# Patient Record
Sex: Female | Born: 1945 | Race: White | Hispanic: No | Marital: Married | Smoking: Former smoker
Health system: Southern US, Community
[De-identification: ages and names within clinical notes are randomized; demographics above are authoritative.]

## PROBLEM LIST (undated history)

## (undated) DIAGNOSIS — K760 Fatty (change of) liver, not elsewhere classified: Secondary | ICD-10-CM

## (undated) DIAGNOSIS — M069 Rheumatoid arthritis, unspecified: Secondary | ICD-10-CM

## (undated) DIAGNOSIS — E119 Type 2 diabetes mellitus without complications: Secondary | ICD-10-CM

## (undated) DIAGNOSIS — G373 Acute transverse myelitis in demyelinating disease of central nervous system: Secondary | ICD-10-CM

## (undated) DIAGNOSIS — I10 Essential (primary) hypertension: Secondary | ICD-10-CM

## (undated) DIAGNOSIS — G43909 Migraine, unspecified, not intractable, without status migrainosus: Secondary | ICD-10-CM

## (undated) DIAGNOSIS — G709 Myoneural disorder, unspecified: Secondary | ICD-10-CM

## (undated) DIAGNOSIS — I214 Non-ST elevation (NSTEMI) myocardial infarction: Secondary | ICD-10-CM

## (undated) DIAGNOSIS — M1990 Unspecified osteoarthritis, unspecified site: Secondary | ICD-10-CM

## (undated) HISTORY — DX: Unspecified osteoarthritis, unspecified site: M19.90

## (undated) HISTORY — PX: FRACTURE SURGERY: SHX138

## (undated) HISTORY — DX: Non-ST elevation (NSTEMI) myocardial infarction: I21.4

## (undated) HISTORY — PX: TUBAL LIGATION: SHX77

---

## 1968-06-15 HISTORY — PX: BREAST LUMPECTOMY: SHX2

## 1977-06-15 DIAGNOSIS — G373 Acute transverse myelitis in demyelinating disease of central nervous system: Secondary | ICD-10-CM

## 1977-06-15 HISTORY — DX: Acute transverse myelitis in demyelinating disease of central nervous system: G37.3

## 1980-06-15 HISTORY — PX: VAGINAL HYSTERECTOMY: SUR661

## 2008-09-13 HISTORY — PX: ORIF ANKLE FRACTURE: SUR919

## 2008-09-22 ENCOUNTER — Inpatient Hospital Stay (HOSPITAL_COMMUNITY): Admission: EM | Admit: 2008-09-22 | Discharge: 2008-09-25 | Payer: Self-pay | Admitting: Emergency Medicine

## 2009-11-28 ENCOUNTER — Ambulatory Visit (HOSPITAL_COMMUNITY): Admission: RE | Admit: 2009-11-28 | Discharge: 2009-11-28 | Payer: Self-pay | Admitting: Rheumatology

## 2010-01-29 DIAGNOSIS — M899 Disorder of bone, unspecified: Secondary | ICD-10-CM | POA: Insufficient documentation

## 2010-09-24 LAB — PROTIME-INR
INR: 1 (ref 0.00–1.49)
Prothrombin Time: 12.8 seconds (ref 11.6–15.2)

## 2010-09-24 LAB — CBC
HCT: 41.7 % (ref 36.0–46.0)
HCT: 41.8 % (ref 36.0–46.0)
Hemoglobin: 13.9 g/dL (ref 12.0–15.0)
Hemoglobin: 14.1 g/dL (ref 12.0–15.0)
MCHC: 33.8 g/dL (ref 30.0–36.0)
MCV: 93.9 fL (ref 78.0–100.0)
Platelets: 130 10*3/uL — ABNORMAL LOW (ref 150–400)
RBC: 4.43 MIL/uL (ref 3.87–5.11)
RBC: 4.45 MIL/uL (ref 3.87–5.11)
RDW: 14.1 % (ref 11.5–15.5)
RDW: 14.4 % (ref 11.5–15.5)
WBC: 8.8 10*3/uL (ref 4.0–10.5)

## 2010-09-24 LAB — BASIC METABOLIC PANEL
Calcium: 8.3 mg/dL — ABNORMAL LOW (ref 8.4–10.5)
Creatinine, Ser: 0.67 mg/dL (ref 0.4–1.2)
GFR calc non Af Amer: >60 mL/min (ref >60)
Sodium: 138 mEq/L (ref 135–145)

## 2010-09-24 LAB — POCT I-STAT, CHEM 8
BUN: 13 mg/dL (ref 6–23)
Calcium, Ion: 1.11 mmol/L — ABNORMAL LOW (ref 1.12–1.32)
Chloride: 107 mEq/L (ref 96–112)
Glucose, Bld: 125 mg/dL — ABNORMAL HIGH (ref 70–99)
Sodium: 140 mEq/L (ref 135–145)
TCO2: 24 mmol/L (ref 0–100)

## 2010-09-24 LAB — DIFFERENTIAL
Lymphocytes Relative: 18 % (ref 12–46)
Monocytes Absolute: 0.8 10*3/uL (ref 0.1–1.0)

## 2010-10-28 NOTE — Op Note (Signed)
[name], Hayley Hogan                ACCOUNT NO.:  [id number]   MEDICAL RECORD NO.:  [id number]          PATIENT TYPE:  INP   LOCATION:  1601                         FACILITY:  [facility]   PHYSICIAN:  Ollen Gross, M.D.    DATE OF BIRTH:  [birth year]   DATE OF PROCEDURE:  09/22/2008  DATE OF DISCHARGE:                               OPERATIVE REPORT   PREOPERATIVE DIAGNOSIS:  Right trimalleolar ankle fracture.   POSTOPERATIVE DIAGNOSIS:  Right trimalleolar ankle fracture.   PROCEDURE:  Open reduction internal fixation right ankle fracture.   SURGEON:  Ollen Gross, MD.   ASSISTANT:  Avel Peace PA-C.   ANESTHESIA:  General.   ESTIMATED BLOOD LOSS:  Minimal.   DRAIN:  None.   TOURNIQUET TIME:  33 minutes at 300 mmHg.   COMPLICATIONS:  None.   CONDITION:  Stable to recovery.   BRIEF CLINICAL NOTE:  [name] is a 65 year old female who had a fall  today sustaining a displaced right trimalleolar ankle fracture.  She  presents now for open reduction internal fixation.   PROCEDURE IN DETAIL:  After the successful administration of general  anesthetic a tourniquet is placed on her right thigh and right lower  extremity is prepped and draped in the usual sterile fashion.  Extremity  is wrapped in Esmarch, tourniquet inflated to 300 mmHg.  A lateral  incision is made over the fibula starting at the tip, coursing  proximally about 4 inches.  Skin is cut with a 10 blade through  subcutaneous tissue which is dissected down to the fracture.  Fracture  is identified and reduced, held with a fracture reduction clamp.  A 7-  hole one-third tubular plate is configured for the fibula.  We filled  one hole with a cancellous screw distally, one with a cortical screw  proximally and the reduction is maintained.  We put two more cortical  screw proximally and two more cancellous screws distally and had very  good purchase.  The fracture was anatomically reduced, we could  visualize the  fracture line; thus, I placed a lag screw from anterior to  posterior, 22-mm in length, and that effectively closed down the  fracture site.  X-rays were taken AP, lateral and mortise views, showing  anatomic reduction of the lateral malleolus.  A small incision was then  made over the medial malleolus starting at the tip and coursing  proximally about 2 inches.  The fracture was identified and anatomically  reduced.  Starting at the tip of the malleolus and coursing proximally,  2.5 drill bits are passed parallel to each other.  The posterior drill  bits then removed and a 50-mm cancellous screw placed with excellent  purchase.  We then placed the anterior screw.  This anatomically reduced  the medial malleolus with excellent compression at the fracture site.  Both wounds were then copiously irrigated with saline solution.  Both wounds are closed subcu with interrupted 2-0 Vicryl and skin with  interrupted 4-0 nylon.  The incisions were cleaned, then dried.  Tourniquet is released with a total time of 33 minutes.  A bulky sterile  dressing is applied and she is placed into the posterior splint,  awakened and transported to recovery in stable condition.      Ollen Gross, M.D.  Electronically Signed     FA/MEDQ  D:  09/22/2008  T:  09/23/2008  Job:  [id number]

## 2010-10-28 NOTE — Discharge Summary (Signed)
NAMEJAEDAN, SCHUMAN                ACCOUNT NO.:  [id number]   MEDICAL RECORD NO.:  [id number]          PATIENT TYPE:  INP   LOCATION:  1601                         FACILITY:  [facility]   PHYSICIAN:  Ollen Gross, M.D.    DATE OF BIRTH:  01-19-1946   DATE OF ADMISSION:  09/22/2008  DATE OF DISCHARGE:  09/25/2008                               DISCHARGE SUMMARY   ADMITTING DIAGNOSES:  1. Right ankle trimalleolar ankle fracture.  2. Rheumatoid arthritis.   DISCHARGE DIAGNOSES:  1. Right trimalleolar ankle fracture status post ORIF right ankle.  2. Rheumatoid arthritis.  3. Small avulsion fracture base of the fifth metatarsal left ankle.   PROCEDURE:  September 22, 2008, ORIF right ankle.  Surgeon Dr. Lequita Halt,  assistant Avel Peace PA-C.  Anesthesia general.  Tourniquet time 33  minutes.  Consults none.   BRIEF HISTORY:  Hayley Hogan is a 65 year old female who had a fall on the  day of admission sustaining a right trimalleolar ankle fracture,  admitted for surgical intervention.   LABORATORY DATA:  CBC on admission hemoglobin of 14.1, hematocrit 41.8,  white cell count 8.8.  PT/INR 12.8 and 1.0, PTT of 27.  B-met slightly  elevated glucose of 125.  Remaining B-met within normal limits.  X-rays  on admission showed a right trimalleolar ankle fracture and also an  avulsion fracture off the base of the fifth metatarsal left ankle.   HOSPITAL COURSE:  Patient was admitted to [facility], taken to  OR, underwent above said procedure without complication.  The patient  tolerated procedure well, later transferred to recovery room on the  orthopedic floor.  Started on p.o. and IV analgesic pain control  following surgery.  Given 24 hours postop IV antibiotics, elevated  splint on pillows with ice.  Doing fairly well on the morning of day #1,  had good pain control.  Started getting up out of bed.  She is non-  weightbearing, touchdown weightbearing to the right lower extremity with  a  trimalleolar ankle fracture.  She was weightbearing as tolerated to  the left foot which had the small avulsion fracture off the base of the  fifth.  Started getting up out of bed by day two she was doing a little  bit better.  Pain was under better control.  We discontinued the fluids  and Hep-Lock the IV.  Started getting up around a little bit more, was  not ready to go home yet.  By the following day of September 25, 2008, she  was tolerating her therapy, tolerating her medicines and was ready go  home.   DISCHARGE/PLAN:  1. Discharged home on September 25, 2008.  2. Discharge diagnoses, please see above.  3. Discharge medications Lovenox, Vicodin, Flexeril.  4. Diet.  Resume home diet.  5. Activity.  She is nonweightbearing to the right lower extremity,      weightbearing as tolerated to the left lower extremity, up with      walker.  Keep splint and leg elevated in ice when not up      ambulating.  6.  Follow-up next week on April 20 with Dr. Lequita Halt.   DISPOSITION:  Home.   CONDITION ON DISCHARGE:  Improved.      Alexzandrew L. Perkins, P.A.C.      Ollen Gross, M.D.  Electronically Signed    ALP/MEDQ  D:  09/25/2008  T:  09/25/2008  Job:  [id number]

## 2011-10-01 ENCOUNTER — Encounter (HOSPITAL_COMMUNITY): Payer: Self-pay | Admitting: Internal Medicine

## 2011-10-01 ENCOUNTER — Observation Stay (HOSPITAL_COMMUNITY)
Admission: AD | Admit: 2011-10-01 | Discharge: 2011-10-04 | DRG: 025 | Disposition: A | Discharge status: Home / Self Care | Payer: BC Managed Care – PPO | Source: Ambulatory Visit | Attending: Internal Medicine | Admitting: Internal Medicine

## 2011-10-01 ENCOUNTER — Inpatient Hospital Stay (HOSPITAL_COMMUNITY): Payer: BC Managed Care – PPO

## 2011-10-01 DIAGNOSIS — T50995A Adverse effect of other drugs, medicaments and biological substances, initial encounter: Secondary | ICD-10-CM | POA: Diagnosis present

## 2011-10-01 DIAGNOSIS — M129 Arthropathy, unspecified: Secondary | ICD-10-CM | POA: Diagnosis present

## 2011-10-01 DIAGNOSIS — R112 Nausea with vomiting, unspecified: Secondary | ICD-10-CM | POA: Diagnosis present

## 2011-10-01 DIAGNOSIS — R519 Headache, unspecified: Secondary | ICD-10-CM | POA: Diagnosis present

## 2011-10-01 DIAGNOSIS — R51 Headache: Principal | ICD-10-CM | POA: Diagnosis present

## 2011-10-01 DIAGNOSIS — M069 Rheumatoid arthritis, unspecified: Secondary | ICD-10-CM | POA: Diagnosis present

## 2011-10-01 DIAGNOSIS — R930 Abnormal findings on diagnostic imaging of skull and head, not elsewhere classified: Secondary | ICD-10-CM | POA: Diagnosis present

## 2011-10-01 DIAGNOSIS — I10 Essential (primary) hypertension: Secondary | ICD-10-CM | POA: Diagnosis present

## 2011-10-01 HISTORY — DX: Migraine, unspecified, not intractable, without status migrainosus: G43.909

## 2011-10-01 HISTORY — DX: Acute transverse myelitis in demyelinating disease of central nervous system: G37.3

## 2011-10-01 HISTORY — DX: Rheumatoid arthritis, unspecified: M06.9

## 2011-10-01 HISTORY — DX: Myoneural disorder, unspecified: G70.9

## 2011-10-01 HISTORY — DX: Essential (primary) hypertension: I10

## 2011-10-01 LAB — CBC
Hemoglobin: 16.3 g/dL — ABNORMAL HIGH (ref 12.0–15.0)
MCH: 30.1 pg (ref 26.0–34.0)
MCV: 85.8 fL (ref 78.0–100.0)
Platelets: 123 10*3/uL — ABNORMAL LOW (ref 150–400)
RBC: 5.42 MIL/uL — ABNORMAL HIGH (ref 3.87–5.11)
WBC: 7.6 10*3/uL (ref 4.0–10.5)

## 2011-10-01 LAB — COMPREHENSIVE METABOLIC PANEL
AST: 16 U/L (ref 0–37)
Albumin: 4.1 g/dL (ref 3.5–5.2)
BUN: 12 mg/dL (ref 6–23)
Calcium: 9.1 mg/dL (ref 8.4–10.5)
Creatinine, Ser: 0.64 mg/dL (ref 0.50–1.10)
GFR calc non Af Amer: >90 mL/min (ref >90)

## 2011-10-01 MED ORDER — POTASSIUM CHLORIDE IN NACL 20-0.9 MEQ/L-% IV SOLN
INTRAVENOUS | Status: DC
  Administered 2011-10-01 – 2011-10-03 (×4): via INTRAVENOUS
  Filled 2011-10-01 (×7): qty 1000

## 2011-10-01 MED ORDER — ACETAMINOPHEN 325 MG PO TABS: 650.0000 mg | ORAL_TABLET | Freq: Four times a day (QID) | ORAL | Status: DC | PRN

## 2011-10-01 MED ORDER — ZOLPIDEM TARTRATE 10 MG PO TABS: 10.0000 mg | ORAL_TABLET | Freq: Every evening | ORAL | Status: DC | PRN

## 2011-10-01 MED ORDER — ACETAMINOPHEN 650 MG RE SUPP: 650.0000 mg | Freq: Four times a day (QID) | RECTAL | Status: DC | PRN

## 2011-10-01 MED ORDER — CALCIUM CARBONATE 1250 (500 CA) MG PO TABS
1.0000 | ORAL_TABLET | Freq: Two times a day (BID) | ORAL | Status: DC
  Filled 2011-10-01 (×6): qty 1

## 2011-10-01 MED ORDER — BISACODYL 10 MG RE SUPP: 10.0000 mg | Freq: Every day | RECTAL | Status: DC | PRN

## 2011-10-01 MED ORDER — ONDANSETRON HCL 4 MG/2ML IJ SOLN
4.0000 mg | Freq: Four times a day (QID) | INTRAMUSCULAR | Status: DC | PRN
  Administered 2011-10-01 – 2011-10-03 (×6): 4 mg via INTRAVENOUS
  Filled 2011-10-01 (×6): qty 2

## 2011-10-01 MED ORDER — HYDROXYCHLOROQUINE SULFATE 200 MG PO TABS
200.0000 mg | ORAL_TABLET | Freq: Two times a day (BID) | ORAL | Status: DC
  Filled 2011-10-01 (×6): qty 1

## 2011-10-01 MED ORDER — ONDANSETRON HCL 4 MG PO TABS
4.0000 mg | ORAL_TABLET | Freq: Four times a day (QID) | ORAL | Status: DC | PRN
  Administered 2011-10-03 – 2011-10-04 (×3): 4 mg via ORAL
  Filled 2011-10-01 (×3): qty 1

## 2011-10-01 MED ORDER — HYDROCODONE-ACETAMINOPHEN 5-325 MG PO TABS
1.0000 | ORAL_TABLET | ORAL | Status: DC | PRN
  Administered 2011-10-03 (×3): 2 via ORAL
  Administered 2011-10-04 (×2): 1 via ORAL
  Administered 2011-10-04: 2 via ORAL
  Filled 2011-10-01: qty 1
  Filled 2011-10-01 (×3): qty 2
  Filled 2011-10-01: qty 1
  Filled 2011-10-01: qty 2

## 2011-10-01 MED ORDER — CALCIUM 75 MG PO TABS: 500.0000 mg | ORAL_TABLET | Freq: Two times a day (BID) | ORAL | Status: DC

## 2011-10-01 MED ORDER — FOLIC ACID 1 MG PO TABS
1.0000 mg | ORAL_TABLET | Freq: Every day | ORAL | Status: DC
  Administered 2011-10-04: 1 mg via ORAL
  Filled 2011-10-01 (×4): qty 1

## 2011-10-01 MED ORDER — HYDROMORPHONE HCL PF 1 MG/ML IJ SOLN
1.0000 mg | INTRAMUSCULAR | Status: DC | PRN
  Administered 2011-10-01 – 2011-10-02 (×5): 1 mg via INTRAVENOUS
  Filled 2011-10-01 (×5): qty 1

## 2011-10-01 MED ORDER — ADULT MULTIVITAMIN W/MINERALS CH
1.0000 | ORAL_TABLET | Freq: Every day | ORAL | Status: DC
  Filled 2011-10-01 (×3): qty 1

## 2011-10-01 MED ORDER — PREDNISOLONE 5 MG PO TABS
5.0000 mg | ORAL_TABLET | Freq: Two times a day (BID) | ORAL | Status: DC
  Filled 2011-10-01 (×3): qty 1

## 2011-10-01 MED ORDER — ALUM & MAG HYDROXIDE-SIMETH 200-200-20 MG/5ML PO SUSP
30.0000 mL | Freq: Four times a day (QID) | ORAL | Status: DC | PRN
Start: 2011-10-01 — End: 2011-10-04

## 2011-10-01 MED ORDER — METOPROLOL TARTRATE 50 MG PO TABS
50.0000 mg | ORAL_TABLET | Freq: Two times a day (BID) | ORAL | Status: DC
  Administered 2011-10-02 – 2011-10-04 (×5): 50 mg via ORAL
  Filled 2011-10-01 (×7): qty 1

## 2011-10-01 MED ORDER — METOPROLOL TARTRATE 25 MG PO TABS
25.0000 mg | ORAL_TABLET | Freq: Two times a day (BID) | ORAL | Status: DC
  Administered 2011-10-01: 25 mg via ORAL
  Filled 2011-10-01: qty 1

## 2011-10-01 NOTE — H&P (Signed)
Hayley Hogan is an 66 y.o. female.   Chief Complaint: severe headache with nausea and vomiting HPI:  The patient is a 66 year old woman who is generally in good health.  She awoke at 2 AM on April 15 with a pounding, severe headache which has continued since then.  The headache is the worst of her life, is constant, worse with lying down, and associated with nausea and vomiting of all food or fluid that she tries to consume.  She is not lightheaded and her vision is without change.  She generally does not have headaches.  She has tried nonsteroidal anti-inflammatory drugs without much relief of her headache.  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  Past Medical History  Diagnosis Date  . Hypertension   . Neuromuscular disorder   . Arthritis     Medications Prior to Admission  Medication Dose Route Frequency Provider Last Rate Last Dose  . 0.9 % NaCl with KCl 20 mEq/ L  infusion   Intravenous Continuous Garlan Fillers, MD 125 mL/hr at 10/01/11 1600    . acetaminophen (TYLENOL) tablet 650 mg  650 mg Oral Q6H PRN Garlan Fillers, MD       Or  . acetaminophen (TYLENOL) suppository 650 mg  650 mg Rectal Q6H PRN Garlan Fillers, MD      . alum & mag hydroxide-simeth (MAALOX/MYLANTA) 200-200-20 MG/5ML suspension 30 mL  30 mL Oral Q6H PRN Garlan Fillers, MD      . bisacodyl (DULCOLAX) suppository 10 mg  10 mg Rectal Daily PRN Garlan Fillers, MD      . calcium carbonate (OS-CAL - dosed in mg of elemental calcium) tablet 500 mg of elemental calcium  1 tablet Oral BID Garlan Fillers, MD      . folic acid (FOLVITE) tablet 1 mg  1 mg Oral Daily Garlan Fillers, MD      . HYDROcodone-acetaminophen [facility]) 5-325 MG per tablet 1-2 tablet  1-2 tablet Oral Q4H PRN Garlan Fillers, MD      . HYDROmorphone (DILAUDID) injection 1 mg  1 mg Intravenous Q4H PRN Garlan Fillers, MD   1 mg at 10/01/11 1532  . hydroxychloroquine (PLAQUENIL) tablet 200 mg  200 mg Oral BID Garlan Fillers, MD      . metoprolol tartrate (LOPRESSOR) tablet 25 mg  25 mg Oral BID Garlan Fillers, MD   25 mg at 10/01/11 1713  . mulitivitamin with minerals tablet 1 tablet  1 tablet Oral Daily Garlan Fillers, MD      . ondansetron [facility] tablet 4 mg  4 mg Oral Q6H PRN Garlan Fillers, MD       Or  . ondansetron [facility]) injection 4 mg  4 mg Intravenous Q6H PRN Garlan Fillers, MD   4 mg at 10/01/11 1529  . prednisoLONE tablet 5 mg  5 mg Oral BID Garlan Fillers, MD      . zolpidem [facility]) tablet 10 mg  10 mg Oral QHS PRN Garlan Fillers, MD      . DISCONTD: Calcium TABS 500 mg  500 mg Oral BID Garlan Fillers, MD       Medications Prior to Admission  Medication Sig Dispense Refill  . CALCIUM PO Take 1 tablet  by mouth daily.      Marland Kitchen leflunomide (ARAVA) 20 MG tablet Take 20 mg by mouth.        ADDITIONAL HOME MEDICATIONS: Leflunomide 20 mg tablets take 5 tabs by mouth on Saturdays and one tablet by mouth on other days of the week, prednisone 5 mg take 1-2 tabs by mouth daily, folate 800 mg by mouth daily, multivitamin 1 tab by mouth daily, Advil 200 mg take one or 2 tablets by mouth 3-4 times daily as needed for pain, calcium carbonate with vitamin D (600-200) take 1 tab by mouth daily, Platinol 200 mg take 2 tabs by mouth daily  PHYSICIANS INVOLVED IN CARE: Jarome Matin (primary care), Ollen Gross (orthopedics), Stacey Drain (rheumatology)  Past Surgical History  Procedure Date  . Abdominal hysterectomy   1973 bilateral tubal ligation, 1973 right breast lumpectomy (benign), 1981 total vaginal hysterectomy, April 2010 right ankle fracture with open reduction and internal fixation  History reviewed. No pertinent family history.  Her father died at age 72 years  for metastatic melanoma.  Her mother is age 35 years and alive and well.  A brother died age 39 years for murder.  A sister died at age 69 years from brain cancer.  Social History:  does not have a smoking history on file. She does not have any smokeless tobacco history on file. Her alcohol and drug histories not on file. She has a long-standing history of tobacco use of one half pack per day for many years that was stopped in August 2012, and she has no history of alcohol abuse.  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  She has one daughter and one son.  Allergies:  Allergies  Allergen Reactions  . Demerol     vomiting     ROS: ankle swelling, high blood pressure, tobacco use and headache, nausea, vomiting, thirsty  PHYSICAL EXAM: Blood pressure 164/96, pulse 73, temperature 98.2 F (36.8 C), temperature source Oral, resp. rate 17, SpO2 100.00%. In general, the patient is a well-nourished well-developed Caucasian woman who looks fatigued and flushed sitting on the side of an exam table.  HEENT exam was significant for bilateral scleral injection, neck was supple without jugular venous distention or carotid bruit, chest was clear to auscultation, heart had a regular rate and rhythm without significant gallop or murmur, abdomen had normal bowel sounds and no hepatosplenomegaly or tenderness or bruit, extremities were without cyanosis, clubbing, or edema.  Neurologic exam: She was alert and well oriented with normal affect, cranial nerves II through XII were normal, sensory exam was grossly normal, motor strength is 5 out of 5 throughout, deep tendon reflexes were normal, Romberg test is normal, tandem gait was normal and finger to nose testing was normal.  Results for orders placed during the hospital encounter of 10/01/11 (from the past 48 hour(s))  COMPREHENSIVE METABOLIC PANEL     Status: Abnormal   Collection Time   10/01/11  3:00  PM      Component Value Range Comment   Sodium 135  135 - 145 (mEq/L)    Potassium 3.6  3.5 - 5.1 (mEq/L)    Chloride 98  96 - 112 (mEq/L)    CO2 25  19 - 32 (mEq/L)    Glucose, Bld 154 (*) 70 - 99 (mg/dL)    BUN 12  6 - 23 (mg/dL)    Creatinine, Ser 0.98  0.50 - 1.10 (mg/dL)  Calcium 9.1  8.4 - 10.5 (mg/dL)    Total Protein 7.7  6.0 - 8.3 (g/dL)    Albumin 4.1  3.5 - 5.2 (g/dL)    AST 16  0 - 37 (U/L)    ALT 22  0 - 35 (U/L)    Alkaline Phosphatase 98  39 - 117 (U/L)    Total Bilirubin 0.5  0.3 - 1.2 (mg/dL)    GFR calc non Af Amer >90  >90 (mL/min)    GFR calc Af Amer >90  >90 (mL/min)   CBC     Status: Abnormal   Collection Time   10/01/11  3:00 PM      Component Value Range Comment   WBC 7.6  4.0 - 10.5 (K/uL)    RBC 5.42 (*) 3.87 - 5.11 (MIL/uL)    Hemoglobin 16.3 (*) 12.0 - 15.0 (g/dL)    HCT 11.9 (*) 14.7 - 46.0 (%)    MCV 85.8  78.0 - 100.0 (fL)    MCH 30.1  26.0 - 34.0 (pg)    MCHC 35.1  30.0 - 36.0 (g/dL)    RDW 82.9  56.2 - 13.0 (%)    Platelets 123 (*) 150 - 400 (K/uL)    No results found.   Assessment/Plan #1 Headache: This was described as the most severe of her life.  Her headache could be from hypertension or the headache could have caused elevated blood pressure.  With a normal neurologic exam a CNS bleed is not likely, however this should be excluded by testing.  We will check a CT scan of the head without IV contrast and a lumbar puncture to exclude the possibility of a CNS bleed and subarachnoid hemorrhage.  In addition, we will treat her with IM Solu-Medrol 120 mg given in our office with Vicodin as needed. #2 Nausea and Vomiting: Most likely from the pain of her headache.  She will be prescribed Zofran IV as needed.  And we will start her on food when she is ready for this. #3 Elevated Blood Pressure: her blood pressure was moderately elevated.  We will check the head CT scan to make sure there is not a CNS mass and evidence of increased intracranial  pressure.  We will then start her in treatment with a beta blocker.  Kalima Saylor G 10/01/2011, 5:50 PM

## 2011-10-02 ENCOUNTER — Inpatient Hospital Stay (HOSPITAL_COMMUNITY): Payer: BC Managed Care – PPO

## 2011-10-02 LAB — CSF CELL COUNT WITH DIFFERENTIAL
Eosinophils, CSF: 0 % (ref 0–1)
Eosinophils, CSF: 0 % (ref 0–1)
Other Cells, CSF: 0
Other Cells, CSF: 0
WBC, CSF: 2 /mm3 (ref 0–5)
WBC, CSF: 2 /mm3 (ref 0–5)

## 2011-10-02 LAB — GRAM STAIN

## 2011-10-02 MED ORDER — VERAPAMIL HCL 40 MG PO TABS
40.0000 mg | ORAL_TABLET | Freq: Three times a day (TID) | ORAL | Status: DC
  Administered 2011-10-02 – 2011-10-03 (×2): 40 mg via ORAL
  Filled 2011-10-02 (×5): qty 1

## 2011-10-02 MED ORDER — HYDROMORPHONE HCL PF 1 MG/ML IJ SOLN
1.0000 mg | INTRAMUSCULAR | Status: DC | PRN
  Administered 2011-10-02 (×3): 2 mg via INTRAVENOUS
  Administered 2011-10-03 (×2): 1 mg via INTRAVENOUS
  Filled 2011-10-02: qty 2
  Filled 2011-10-02 (×2): qty 1
  Filled 2011-10-02 (×2): qty 2

## 2011-10-02 MED ORDER — HYDROMORPHONE HCL PF 1 MG/ML IJ SOLN: 1.0000 mg | INTRAMUSCULAR | Status: DC | PRN

## 2011-10-02 MED ORDER — PREDNISONE 10 MG PO TABS
10.0000 mg | ORAL_TABLET | Freq: Two times a day (BID) | ORAL | Status: DC
  Administered 2011-10-02 – 2011-10-04 (×4): 10 mg via ORAL
  Filled 2011-10-02 (×6): qty 1

## 2011-10-02 MED ORDER — PREDNISOLONE 5 MG PO TABS
10.0000 mg | ORAL_TABLET | Freq: Two times a day (BID) | ORAL | Status: DC
  Filled 2011-10-02: qty 2

## 2011-10-02 NOTE — Progress Notes (Signed)
Subjective: She continues to have a moderately severe bifrontal headache with some nausea.  She has been able to eat a small amount of food, but has been using Zofran regularly.  She has not had change in vision or difficulty walking.  Objective: Vital signs in last 24 hours: Temp:  [98 F (36.7 C)-98.4 F (36.9 C)] 98 F (36.7 C) (04/19 0530) Pulse Rate:  [63-64] 63  (04/19 0530) Resp:  [18-22] 22  (04/19 0530) BP: (144-153)/(81-95) 153/95 mmHg (04/19 0530) SpO2:  [94 %-95 %] 95 % (04/19 0530) Weight:  [90 kg (198 lb 6.6 oz)] 90 kg (198 lb 6.6 oz) (04/19 0530) Weight change:    Intake/Output from previous day:     General appearance: alert, cooperative, mild distress and from moderate bifrontal headache with nausea Resp: clear to auscultation bilaterally Cardio: regular rate and rhythm, S1, S2 normal, no murmur, click, rub or gallop GI: soft, non-tender; bowel sounds normal; no masses,  no organomegaly Neurologic: Grossly normal  Lab Results:  Basename 10/01/11 1500  WBC 7.6  HGB 16.3*  HCT 46.5*  PLT 123*   BMET  Basename 10/01/11 1500  NA 135  K 3.6  CL 98  CO2 25  GLUCOSE 154*  BUN 12  CREATININE 0.64  CALCIUM 9.1   CMET CMP     Component Value Date/Time   NA 135 10/01/2011 1500   K 3.6 10/01/2011 1500   CL 98 10/01/2011 1500   CO2 25 10/01/2011 1500   GLUCOSE 154* 10/01/2011 1500   BUN 12 10/01/2011 1500   CREATININE 0.64 10/01/2011 1500   CALCIUM 9.1 10/01/2011 1500   PROT 7.7 10/01/2011 1500   ALBUMIN 4.1 10/01/2011 1500   AST 16 10/01/2011 1500   ALT 22 10/01/2011 1500   ALKPHOS 98 10/01/2011 1500   BILITOT 0.5 10/01/2011 1500   GFRNONAA >90 10/01/2011 1500   GFRAA >90 10/01/2011 1500    CBG (last 3)  No results found for this basename: GLUCAP:3 in the last 72 hours  INR RESULTS:   Lab Results  Component Value Date   INR 1.0 09/22/2008     Studies/Results: Ct Head Wo Contrast  10/01/2011  *RADIOLOGY REPORT*  Clinical Data: Worst headache of her  life.  CT HEAD WITHOUT CONTRAST  Technique:  Contiguous axial images were obtained from the base of the skull through the vertex without contrast.  Comparison: None available.  Findings: A focal area of hypoattenuation adjacent to the frontal horn of the right lateral ventricle measures 14 mm.  This likely represents an ischemic lesion.  No other acute cortical infarct, hemorrhage, mass lesion is present.  The ventricles are of normal size.  No significant extra-axial fluid collection is present.  The paranasal sinuses and mastoid air cells are clear.  The osseous skull is intact.  IMPRESSION:  1.  Asymmetric white matter disease adjacent to the frontal horn of the right lateral ventricle. 2.  No acute intracranial abnormality.  Original Report Authenticated By: Jamesetta Orleans. MATTERN, M.D.   Mr Maxine Glenn Head Wo Contrast  10/02/2011  *RADIOLOGY REPORT*  Clinical Data:  Severe headache.  Abnormal head CT.  Acute but ill- defined vascular disease.  MRI HEAD WITHOUT CONTRAST MRA HEAD WITHOUT CONTRAST  Technique:  Multiplanar, multiecho pulse sequences of the brain and surrounding structures were obtained without intravenous contrast. Angiographic images of the head were obtained using MRA technique without contrast.  Comparison:  Head CT 10/01/2011  MRI HEAD  Findings:  Diffusion imaging does not  show any acute or subacute infarction.  The brainstem and cerebellum are normal.  The cerebral hemispheres show scattered foci of abnormal T2 and FLAIR signal within the hemispheric deep white matter.  These are primarily periventricular.  The largest lesion is in the right frontal white matter adjacent to the frontal horn of the right lateral ventricle, measuring 1.5 cm in size.  These abnormalities could be old small vessel infarctions.  The possibility of chronic demyelinating disease does exist.  No cortical or large vessel territory infarction.  No mass lesion, hemorrhage, hydrocephalus or extra- axial collection.  No  pituitary mass.  No inflammatory sinus disease.  There is a small amount of fluid in the right mastoid air cells.  No skull or skull base lesion.  IMPRESSION: No acute brain pathology.  No specific cause of headache is identified.  Chronic appearing white matter lesions within the cerebral hemispheres, primarily affecting the periventricular white matter. Largest lesion measures 1.5 cm in the right frontal white matter. These abnormalities could relate to old white matter small vessel infarctions.  The possibility of demyelinating disease/multiple sclerosis does exist.  MRA HEAD  Findings: Both internal carotid arteries are widely patent into the brain.  The anterior and middle cerebral vessels are patent without proximal stenosis, aneurysm or vascular malformation.  Both vertebral arteries are widely patent to the basilar.  No basilar stenosis.  Posterior circulation branch vessels appear normal.  IMPRESSION: Normal intracranial MR angiography of the large and medium-sized vessels.  Original Report Authenticated By: Thomasenia Sales, M.D.   Mr Brain Wo Contrast  10/02/2011  *RADIOLOGY REPORT*  Clinical Data:  Severe headache.  Abnormal head CT.  Acute but ill- defined vascular disease.  MRI HEAD WITHOUT CONTRAST MRA HEAD WITHOUT CONTRAST  Technique:  Multiplanar, multiecho pulse sequences of the brain and surrounding structures were obtained without intravenous contrast. Angiographic images of the head were obtained using MRA technique without contrast.  Comparison:  Head CT 10/01/2011  MRI HEAD  Findings:  Diffusion imaging does not show any acute or subacute infarction.  The brainstem and cerebellum are normal.  The cerebral hemispheres show scattered foci of abnormal T2 and FLAIR signal within the hemispheric deep white matter.  These are primarily periventricular.  The largest lesion is in the right frontal white matter adjacent to the frontal horn of the right lateral ventricle, measuring 1.5 cm in size.   These abnormalities could be old small vessel infarctions.  The possibility of chronic demyelinating disease does exist.  No cortical or large vessel territory infarction.  No mass lesion, hemorrhage, hydrocephalus or extra- axial collection.  No pituitary mass.  No inflammatory sinus disease.  There is a small amount of fluid in the right mastoid air cells.  No skull or skull base lesion.  IMPRESSION: No acute brain pathology.  No specific cause of headache is identified.  Chronic appearing white matter lesions within the cerebral hemispheres, primarily affecting the periventricular white matter. Largest lesion measures 1.5 cm in the right frontal white matter. These abnormalities could relate to old white matter small vessel infarctions.  The possibility of demyelinating disease/multiple sclerosis does exist.  MRA HEAD  Findings: Both internal carotid arteries are widely patent into the brain.  The anterior and middle cerebral vessels are patent without proximal stenosis, aneurysm or vascular malformation.  Both vertebral arteries are widely patent to the basilar.  No basilar stenosis.  Posterior circulation branch vessels appear normal.  IMPRESSION: Normal intracranial MR angiography of the large and  medium-sized vessels.  Original Report Authenticated By: Thomasenia Sales, M.D.   Dg Fluoro Guide Lumbar Puncture  10/02/2011  *RADIOLOGY REPORT*  Clinical Data:  Worst headache of life.  DIAGNOSTIC LUMBAR PUNCTURE UNDER FLUOROSCOPIC GUIDANCE  Fluoroscopy time:  7 seconds  Technique:  Informed consent was obtained from the patient prior to the procedure, including potential complications of headache, allergy, and pain.   With the patient prone, the lower back was prepped with Betadine.  1% Lidocaine was used for local anesthesia. Lumbar puncture was performed at the L3-L4 level using a 20 gauge needle with return of clear CSF with an opening pressure of 25 cm water.   17 ml of CSF were obtained for laboratory  studies.  The patient tolerated the procedure well and there were no apparent complications.  IMPRESSION: Technically successful diagnostic lumbar puncture under fluoroscopic guidance.  Clear CSF.  No visual evidence for xanthochromia or subarachnoid hemorrhage.  Original Report Authenticated By: Elsie Stain, M.D.    Medications: I have reviewed the patient's current medications.  Assessment/Plan: #1 Headache: Quite severe without much in full improvement on current medications.  Workup today did not show evidence of subarachnoid hemorrhage or CNS aneurysm.  We will treat her with moderate dose corticosteroids and nausea medication. #2 Hypertension: primarily due to moderately severe headache and improving with beta blocker treatment.  I will add low dose verapamil to her regimen.  LOS: 1 day   Samay Delcarlo G 10/02/2011, 6:35 PM

## 2011-10-02 NOTE — Progress Notes (Signed)
Pt refused all meds except blood pressure medication. States she told the  Dr that yesterday, medicated for nausea, and attempting to eat her lunch.

## 2011-10-03 MED ORDER — VERAPAMIL HCL ER 120 MG PO TBCR
120.0000 mg | EXTENDED_RELEASE_TABLET | Freq: Every day | ORAL | Status: DC
  Administered 2011-10-03 – 2011-10-04 (×2): 120 mg via ORAL
  Filled 2011-10-03 (×2): qty 1

## 2011-10-03 NOTE — Progress Notes (Addendum)
Subjective: 5/10 headache still. The drugs are helping though. She feels a lot of this could be due to side effect of the new arava she started last week.  Objective: Vital signs in last 24 hours: Temp:  [97.9 F (36.6 C)-98.1 F (36.7 C)] 98.1 F (36.7 C) (04/20 6295) Pulse Rate:  [57-61] 60  (04/20 1007) Resp:  [18-22] 20  (04/20 0614) BP: (140-161)/(78-93) 159/93 mmHg (04/20 0614) SpO2:  [92 %-96 %] 94 % (04/20 0614) Weight change:  Last BM Date: 10/01/11  Intake/Output from previous day: 04/19 0701 - 04/20 0700 In: 745 [P.O.:120; I.V.:625] Out: -  Intake/Output this shift:    General appearance: alert, cooperative and appears stated age Resp: clear to auscultation bilaterally Cardio: regular rate and rhythm, S1, S2 normal, no murmur, click, rub or gallop GI: soft, non-tender; bowel sounds normal; no masses,  no organomegaly Extremities: extremities normal, atraumatic, no cyanosis or edema Neurologic: Grossly normal   Lab Results:  Basename 10/01/11 1500  WBC 7.6  HGB 16.3*  HCT 46.5*  PLT 123*   BMET  Basename 10/01/11 1500  NA 135  K 3.6  CL 98  CO2 25  GLUCOSE 154*  BUN 12  CREATININE 0.64  CALCIUM 9.1   CMET CMP     Component Value Date/Time   NA 135 10/01/2011 1500   K 3.6 10/01/2011 1500   CL 98 10/01/2011 1500   CO2 25 10/01/2011 1500   GLUCOSE 154* 10/01/2011 1500   BUN 12 10/01/2011 1500   CREATININE 0.64 10/01/2011 1500   CALCIUM 9.1 10/01/2011 1500   PROT 7.7 10/01/2011 1500   ALBUMIN 4.1 10/01/2011 1500   AST 16 10/01/2011 1500   ALT 22 10/01/2011 1500   ALKPHOS 98 10/01/2011 1500   BILITOT 0.5 10/01/2011 1500   GFRNONAA >90 10/01/2011 1500   GFRAA >90 10/01/2011 1500     Studies/Results: Ct Head Wo Contrast  10/01/2011  *RADIOLOGY REPORT*  Clinical Data: Worst headache of her life.  CT HEAD WITHOUT CONTRAST  Technique:  Contiguous axial images were obtained from the base of the skull through the vertex without contrast.  Comparison: None  available.  Findings: A focal area of hypoattenuation adjacent to the frontal horn of the right lateral ventricle measures 14 mm.  This likely represents an ischemic lesion.  No other acute cortical infarct, hemorrhage, mass lesion is present.  The ventricles are of normal size.  No significant extra-axial fluid collection is present.  The paranasal sinuses and mastoid air cells are clear.  The osseous skull is intact.  IMPRESSION:  1.  Asymmetric white matter disease adjacent to the frontal horn of the right lateral ventricle. 2.  No acute intracranial abnormality.  Original Report Authenticated By: Jamesetta Orleans. MATTERN, M.D.   Mr Maxine Glenn Head Wo Contrast  10/02/2011  *RADIOLOGY REPORT*  Clinical Data:  Severe headache.  Abnormal head CT.  Acute but ill- defined vascular disease.  MRI HEAD WITHOUT CONTRAST MRA HEAD WITHOUT CONTRAST  Technique:  Multiplanar, multiecho pulse sequences of the brain and surrounding structures were obtained without intravenous contrast. Angiographic images of the head were obtained using MRA technique without contrast.  Comparison:  Head CT 10/01/2011  MRI HEAD  Findings:  Diffusion imaging does not show any acute or subacute infarction.  The brainstem and cerebellum are normal.  The cerebral hemispheres show scattered foci of abnormal T2 and FLAIR signal within the hemispheric deep white matter.  These are primarily periventricular.  The largest lesion is in  the right frontal white matter adjacent to the frontal horn of the right lateral ventricle, measuring 1.5 cm in size.  These abnormalities could be old small vessel infarctions.  The possibility of chronic demyelinating disease does exist.  No cortical or large vessel territory infarction.  No mass lesion, hemorrhage, hydrocephalus or extra- axial collection.  No pituitary mass.  No inflammatory sinus disease.  There is a small amount of fluid in the right mastoid air cells.  No skull or skull base lesion.  IMPRESSION: No acute  brain pathology.  No specific cause of headache is identified.  Chronic appearing white matter lesions within the cerebral hemispheres, primarily affecting the periventricular white matter. Largest lesion measures 1.5 cm in the right frontal white matter. These abnormalities could relate to old white matter small vessel infarctions.  The possibility of demyelinating disease/multiple sclerosis does exist.  MRA HEAD  Findings: Both internal carotid arteries are widely patent into the brain.  The anterior and middle cerebral vessels are patent without proximal stenosis, aneurysm or vascular malformation.  Both vertebral arteries are widely patent to the basilar.  No basilar stenosis.  Posterior circulation branch vessels appear normal.  IMPRESSION: Normal intracranial MR angiography of the large and medium-sized vessels.  Original Report Authenticated By: Thomasenia Sales, M.D.   Mr Brain Wo Contrast  10/02/2011  *RADIOLOGY REPORT*  Clinical Data:  Severe headache.  Abnormal head CT.  Acute but ill- defined vascular disease.  MRI HEAD WITHOUT CONTRAST MRA HEAD WITHOUT CONTRAST  Technique:  Multiplanar, multiecho pulse sequences of the brain and surrounding structures were obtained without intravenous contrast. Angiographic images of the head were obtained using MRA technique without contrast.  Comparison:  Head CT 10/01/2011  MRI HEAD  Findings:  Diffusion imaging does not show any acute or subacute infarction.  The brainstem and cerebellum are normal.  The cerebral hemispheres show scattered foci of abnormal T2 and FLAIR signal within the hemispheric deep white matter.  These are primarily periventricular.  The largest lesion is in the right frontal white matter adjacent to the frontal horn of the right lateral ventricle, measuring 1.5 cm in size.  These abnormalities could be old small vessel infarctions.  The possibility of chronic demyelinating disease does exist.  No cortical or large vessel territory infarction.   No mass lesion, hemorrhage, hydrocephalus or extra- axial collection.  No pituitary mass.  No inflammatory sinus disease.  There is a small amount of fluid in the right mastoid air cells.  No skull or skull base lesion.  IMPRESSION: No acute brain pathology.  No specific cause of headache is identified.  Chronic appearing white matter lesions within the cerebral hemispheres, primarily affecting the periventricular white matter. Largest lesion measures 1.5 cm in the right frontal white matter. These abnormalities could relate to old white matter small vessel infarctions.  The possibility of demyelinating disease/multiple sclerosis does exist.  MRA HEAD  Findings: Both internal carotid arteries are widely patent into the brain.  The anterior and middle cerebral vessels are patent without proximal stenosis, aneurysm or vascular malformation.  Both vertebral arteries are widely patent to the basilar.  No basilar stenosis.  Posterior circulation branch vessels appear normal.  IMPRESSION: Normal intracranial MR angiography of the large and medium-sized vessels.  Original Report Authenticated By: Thomasenia Sales, M.D.   Dg Fluoro Guide Lumbar Puncture  10/02/2011  *RADIOLOGY REPORT*  Clinical Data:  Worst headache of life.  DIAGNOSTIC LUMBAR PUNCTURE UNDER FLUOROSCOPIC GUIDANCE  Fluoroscopy time:  7  seconds  Technique:  Informed consent was obtained from the patient prior to the procedure, including potential complications of headache, allergy, and pain.   With the patient prone, the lower back was prepped with Betadine.  1% Lidocaine was used for local anesthesia. Lumbar puncture was performed at the L3-L4 level using a 20 gauge needle with return of clear CSF with an opening pressure of 25 cm water.   17 ml of CSF were obtained for laboratory studies.  The patient tolerated the procedure well and there were no apparent complications.  IMPRESSION: Technically successful diagnostic lumbar puncture under fluoroscopic  guidance.  Clear CSF.  No visual evidence for xanthochromia or subarachnoid hemorrhage.  Original Report Authenticated By: Elsie Stain, M.D.    Medications: I have reviewed the patient's current medications.  Scheduled Meds:   . calcium carbonate  1 tablet Oral BID  . folic acid  1 mg Oral Daily  . hydroxychloroquine  200 mg Oral BID  . metoprolol tartrate  50 mg Oral BID  . mulitivitamin with minerals  1 tablet Oral Daily  . predniSONE  10 mg Oral BID WC  . verapamil  40 mg Oral Q8H  . DISCONTD: prednisoLONE  10 mg Oral BID  . DISCONTD: prednisoLONE  5 mg Oral BID   Continuous Infusions:   . 0.9 % NaCl with KCl 20 mEq / L 125 mL/hr at 10/03/11 0559   PRN Meds:.acetaminophen, acetaminophen, alum & mag hydroxide-simeth, bisacodyl, HYDROcodone-acetaminophen, HYDROmorphone (DILAUDID) injection, ondansetron (ZOFRAN) IV, ondansetron, zolpidem, DISCONTD: HYDROmorphone, DISCONTD: HYDROmorphone  LP totally clear.  Assessment/Plan:  Principal Problem:  *Headache-it seems like a reaction to arava at this point. Active Problems:  Nausea and vomiting-continue treatment. Stop iv pain meds and see if she can go home tomorrow.  Hypertension-follow. Verapamil added. Mri brain lesio-doubt it relates to headache. Radiologist wondered about MS. i see no MS symptoms but i would suggest f/up mri brain in 6 mos.   LOS: 2 days   Ezequiel Kayser, MD 10/03/2011, 10:55 AM

## 2011-10-04 MED ORDER — PREDNISONE 5 MG PO TABS: 5.0000 mg | ORAL_TABLET | Freq: Every day | ORAL | Status: DC

## 2011-10-04 MED ORDER — ONDANSETRON 8 MG PO TBDP: 8.0000 mg | ORAL_TABLET | Freq: Three times a day (TID) | ORAL | Status: AC | PRN

## 2011-10-04 MED ORDER — METOPROLOL TARTRATE 50 MG PO TABS: 50.0000 mg | ORAL_TABLET | Freq: Two times a day (BID) | ORAL | Status: DC

## 2011-10-04 MED ORDER — HYDROCODONE-ACETAMINOPHEN 5-325 MG PO TABS: 1.0000 | ORAL_TABLET | Freq: Four times a day (QID) | ORAL | Status: AC | PRN

## 2011-10-04 MED ORDER — VERAPAMIL HCL ER 120 MG PO TBCR: 120.0000 mg | EXTENDED_RELEASE_TABLET | Freq: Every day | ORAL | Status: DC

## 2011-10-04 NOTE — Progress Notes (Signed)
DC papers discussed and signed. IV removed, tip intact. Rx given to pt by md. Pt refused wheelchair.

## 2011-10-04 NOTE — Discharge Summary (Addendum)
Physician Discharge Summary  Patient ID: Conya Ellinwood MRN: [id number] DOB/AGE: March 08, 1946 66 y.o.  Admit date: 10/01/2011 Discharge date: 10/04/2011  Admission Diagnoses:headache  Discharge Diagnoses:    *Headache-extensive work-up negative.  Felt to be due to side effect of new arava medication White matter lesion on mri brain, suggest 6 month follow-up Active Problems:  Nausea and vomiting  Hypertension RA   Discharged Condition: good  Hospital Course: Reisha is a pleasant 66 yo female who presented with severe headache and nausea and vomiting. She was treated with iv and then oral narcotics.  Full evaluation with mri, mra brain, LP which was normal, and ct head all were reassuring.  Headache was improved at time of discharge but she was still requiring hydrocodone dosing.  On 10/04/11 she was deemed stable for discharge home.  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   Consults: none  Significant Diagnostic Studies: see above  Treatments: pain medication and bp meds.  Discharge Exam:  Blood pressure 152/84, pulse 63, temperature 98.2 F (36.8 C), temperature source Oral, resp. rate 20, height 5\' 8"  (1.727 m), weight 90 kg (198 lb 6.6 oz), SpO2 96.00%.  Physical Exam:alert, cooperative, oriented times 4, neurologically intact fully, lungs cta bilat. With no w/r/r. Heart is rrr with no m/r/g. abd is benign with no mass, hsm or tenderness, no edema.  Disposition:discharge to home.  Medication List  As of 10/04/2011 10:00 AM   ASK your doctor about these medications         CALCIUM PO   Take 1 tablet by mouth daily.      folic acid 1 MG tablet   Commonly known as: FOLVITE   Take 1 mg by mouth daily.      hydroxychloroquine 200 MG tablet   Commonly known as: PLAQUENIL   Take 200 mg by mouth 2 (two) times daily.      Stop the leflunomide 20 MG tablet-do not ever take this again   Commonly known as: ARAVA   Take 20 mg by mouth.     mulitivitamin with minerals Tabs   Take 1 tablet by mouth daily.      predniSONE 5 MG tablet   Commonly known as: DELTASONE   Take 5 mg by mouth one (two) time daily.-see written script. Hydrocodone/apap 5/325 one to two every 6 hrs as needed for pain zofran orally disintegrating tablet 8 mg 1/2 to one dissolved in mouth every 8 hours as needed for nausea or vomiting. otc colace 100 mg two pills daily as a stool softener Verapamil  SR 120 mg daily otc miralax 17 gm scoop in 8 oz water daily for constipation Metoprolol tartrate 50 mg one by mouth twice a day  Check bp daily and record. See Dr. Eloise Harman in one week. Call [phone] for visit. Call if any problems.             SignedRodrigo Ran A 10/04/2011, 10:00 AM

## 2011-10-06 LAB — CSF CULTURE W GRAM STAIN: Culture: NO GROWTH

## 2011-10-09 ENCOUNTER — Encounter: Payer: Self-pay | Admitting: Neurology

## 2011-11-27 ENCOUNTER — Ambulatory Visit (INDEPENDENT_AMBULATORY_CARE_PROVIDER_SITE_OTHER): Payer: BC Managed Care – PPO | Admitting: Surgery

## 2011-12-15 ENCOUNTER — Other Ambulatory Visit: Payer: Self-pay | Admitting: Neurology

## 2011-12-15 ENCOUNTER — Encounter: Payer: Self-pay | Admitting: Neurology

## 2011-12-15 ENCOUNTER — Ambulatory Visit (INDEPENDENT_AMBULATORY_CARE_PROVIDER_SITE_OTHER): Payer: BC Managed Care – PPO | Admitting: Neurology

## 2011-12-15 ENCOUNTER — Other Ambulatory Visit (INDEPENDENT_AMBULATORY_CARE_PROVIDER_SITE_OTHER): Payer: BC Managed Care – PPO

## 2011-12-15 VITALS — BP 126/80 | HR 64 | Wt 185.0 lb

## 2011-12-15 DIAGNOSIS — G609 Hereditary and idiopathic neuropathy, unspecified: Secondary | ICD-10-CM

## 2011-12-15 LAB — COMPREHENSIVE METABOLIC PANEL
ALT: 42 U/L — ABNORMAL HIGH (ref 0–35)
AST: 21 U/L (ref 0–37)
Albumin: 4.1 g/dL (ref 3.5–5.2)
CO2: 29 mEq/L (ref 19–32)
Calcium: 9.6 mg/dL (ref 8.4–10.5)
Chloride: 98 mEq/L (ref 96–112)
Creatinine, Ser: 0.9 mg/dL (ref 0.4–1.2)
GFR: 65.37 mL/min (ref >60.00)
Potassium: 4.4 mEq/L (ref 3.5–5.1)

## 2011-12-15 LAB — CBC WITH DIFFERENTIAL/PLATELET
Basophils Absolute: 0 10*3/uL (ref 0.0–0.1)
Basophils Relative: 0.3 % (ref 0.0–3.0)
Eosinophils Absolute: 0.1 10*3/uL (ref 0.0–0.7)
Hemoglobin: 14.5 g/dL (ref 12.0–15.0)
Lymphocytes Relative: 18.2 % (ref 12.0–46.0)
Monocytes Relative: 7.1 % (ref 3.0–12.0)
Neutro Abs: 5.3 10*3/uL (ref 1.4–7.7)
Neutrophils Relative %: 72.6 % (ref 43.0–77.0)
RBC: 4.91 Mil/uL (ref 3.87–5.11)

## 2011-12-15 LAB — TSH: TSH: 1.48 u[IU]/mL (ref 0.35–5.50)

## 2011-12-15 NOTE — Patient Instructions (Addendum)
Go to the basement to have your labs drawn today.  We will call you with the results and send a copy to your rheumatologist.

## 2011-12-15 NOTE — Progress Notes (Signed)
Dear Dr. Eloise Harman,  Thank you for having me see Hayley Hogan in consultation today at [facility] for her problem with abnormal white matter lesions.  As you may recall, she is a 66 y.o. year old female with a history of idiopathic transverse myelitis, remote history of severe migraines, rheumatoid arthritis, hypertension who in April of this year had a holo-cranial headache that was severe worse with sitting up. Because of its sudden onset was worked up as a possible subarachnoid hemorrhage. Lumbar puncture was unremarkable as was brain imaging including an MRI brain and MRA head. The headache was attributed to a change in her rheumatoid arthritis medication. She was also noted at headache onset to have elevated blood pressures in the 200s. She has not had a recurrent headache. The rheumatoid arthritis medication that was started was stopped. However on her MRI brain there was noted to be a periventricular white matter lesion that was of unknown etiology. She is here today for further information on that.  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 She has had almost complete resolution from this. The details of this are not well known that it took at least 6 months for her to recover. She only has some residual difficulty walking as well as sensory problems. She never had any other neurologic symptoms.  She was diagnosed with rheumatoid arthritis because of bilateral wrist pain 5 years ago.  She is just recently developed left lateral foot numbness.  She is a long history of difficulty walking on uneven surfaces as well as standing in the shower with her eyes closed. This has been since her transverse myelitis.  Medical History:  Idiopathic transverse myelitis, remote severe headaches, rheumatoid arthritis, hypertension  Surgical History: ORIF ankle fracture, breast lumpectomy, vaginal hysterectomy  Social History: Social alcohol. Quit smoking  last year  Family History: No family history of neurologic problems.  Current Outpatient Prescriptions on File Prior to Visit  Medication Sig Dispense Refill  . CALCIUM PO Take 1 tablet by mouth daily.      . folic acid (FOLVITE) 1 MG tablet Take 1 mg by mouth daily.      . hydroxychloroquine (PLAQUENIL) 200 MG tablet Take 200 mg by mouth 2 (two) times daily.      Marland Kitchen losartan-hydrochlorothiazide (HYZAAR) 100-12.5 MG per tablet Take 1 tablet by mouth daily.      . metoprolol (LOPRESSOR) 50 MG tablet Take 1 tablet (50 mg total) by mouth 2 (two) times daily.  60 tablet  11  . Multiple Vitamin (MULITIVITAMIN WITH MINERALS) TABS Take 1 tablet by mouth daily.      . predniSONE (DELTASONE) 5 MG tablet Take 1 tablet (5 mg total) by mouth daily.  30 tablet  11  . verapamil (CALAN-SR) 120 MG CR tablet Take 1 tablet (120 mg total) by mouth daily.  30 tablet  11     Allergies  Allergen Reactions  . Demerol Other (See Comments)    vomiting      Review of systems:  13 systems were reviewed and are notable for joint pain worse in the hands and wrists.  All other review of systems are unremarkable.   Examination:  Filed Vitals:   12/15/11 1029  BP: 126/80  Pulse: 64  Weight: 185 lb (83.915 kg)     In general, well appearing women  Cardiovascular: The patient has a regular rate and rhythm and no carotid bruits.  Fundoscopy:  Disks are  flat. Vessel caliber within normal limits.  Mental status:   The patient is oriented to person, place and time. Recent and remote memory are intact. Attention span and concentration are normal. Language including repetition, naming, following commands are intact. Fund of knowledge of current and historical events, as well as vocabulary are normal.  Cranial Nerves: Pupils are equally round and reactive to light. Visual fields full to confrontation. Extraocular movements are intact without nystagmus. Facial sensation and muscles of mastication are intact.  Muscles of facial expression are symmetric. Hearing intact to bilateral finger rub. Tongue protrusion, uvula, palate midline.  Shoulder shrug intact  Motor:  The patient has normal bulk and tone, no pronator drift.  There are no adventitious movements.  5/5 muscle strength bilaterally.  Reflexes:   Biceps  Triceps Brachioradialis Knee Ankle  Right 2+  2+  2+   2+ 0  Left  2+  2+  2+   2+ 0  Toes down  Coordination:  Normal finger to nose.  No dysdiadokinesia.  Sensation is decreased to vibration and temperature in a length dep pattern in her feet and arms(she can still sense vibration at her toes).  Position intact.  Additional decrease sensation lateral aspect of left foot extending up lateral leg.  Gait and Station are normal.  Tandem gait is intact.  Romberg is positive.  MRI brain was reviewed.  Large periventricular lesion on the right abutting the CC.  Minimal other abnormal areas.  No contrast given.  Impression and Recommendations: 1.  Abnormal brain MRI - I have a feeling that this is possibly related to her old transverse myelitis.  It does look like a suspicious demyelinating lesion, but I suspect it is old.  At this time, given the relatively benign look of her brain lesion I would favor radiographic monitoring with a repeat scan in 1 year.  MRI brain with and without contrast.  However, if develops other symptoms I would do this sooner.  2.  Lateral foot numbness - seems like a sural nerve neuropathy.  Unknown etiology.  Small concern for vasculitis neuropathy given her history of RA. However, we will just watch this for now.  If it gets worse she will need a EMG/NCS particularly if other territories are involved. 3.  Headache - no recurrent, ? caused by new RA medication 4.  Transverse myelitis - she still has mild posterior column problems with the myelitis.  Stable. 5.  ?PN - she also has signs of a PN.  I am going to check PN labs for now.  She will call me if her  symptoms change.  As I will be leaving [place] I will leave it up to you whether you want to follow her radiographically yourself, or have her seen by another neurologist.  She will call me before September if she worsens.  Thank you for having [place] see Hayley Hogan in consultation.  Feel free to contact me with any questions.  Lupita Raider Modesto Charon, MD [facility], [place] [address] [place], [place] [address] Phone: [phone] Fax: [phone].

## 2011-12-18 LAB — SPEP & IFE WITH QIG
Albumin ELP: 58 % (ref 55.8–66.1)
Beta Globulin: 5.7 % (ref 4.7–7.2)
IgA: 210 mg/dL (ref 69–380)
IgG (Immunoglobin G), Serum: 919 mg/dL (ref 690–1700)
IgM, Serum: 128 mg/dL (ref 52–322)
Total Protein, Serum Electrophoresis: 7 g/dL (ref 6.0–8.3)

## 2011-12-22 ENCOUNTER — Telehealth: Payer: Self-pay | Admitting: Neurology

## 2011-12-22 NOTE — Telephone Encounter (Signed)
**  Dr. Modesto Charon, please advise lab results. Several abn results noted: A1C, platelets, ALT. Thanks.

## 2011-12-22 NOTE — Telephone Encounter (Signed)
Plts mildly low, but very close to normal.  Likely from methotrexate, likely the same reason her ALT is slightly high.  However, looks like her glucose has been chronically high.  Does she carry a diagnosis of diabetes?  If not she should see Dr. Eloise Harman about this.

## 2011-12-22 NOTE — Telephone Encounter (Signed)
Pt would like results of blood work. Also faxing results to Dr. Kellie Simmering (rheumatology) at [phone].

## 2011-12-23 NOTE — Telephone Encounter (Signed)
Spoke with a female. Dhwani not at home so will call back later.

## 2011-12-23 NOTE — Telephone Encounter (Signed)
Spoke with Olegario Messier. She states that Dr. Eloise Harman went over her lab work.

## 2012-01-07 ENCOUNTER — Ambulatory Visit (INDEPENDENT_AMBULATORY_CARE_PROVIDER_SITE_OTHER): Payer: BC Managed Care – PPO | Admitting: Surgery

## 2012-01-07 ENCOUNTER — Encounter (INDEPENDENT_AMBULATORY_CARE_PROVIDER_SITE_OTHER): Payer: Self-pay | Admitting: Surgery

## 2012-01-07 VITALS — BP 102/70 | HR 62 | Temp 97.5°F | Resp 18 | Ht 67.5 in | Wt 186.5 lb

## 2012-01-07 DIAGNOSIS — M069 Rheumatoid arthritis, unspecified: Secondary | ICD-10-CM | POA: Insufficient documentation

## 2012-01-07 NOTE — Progress Notes (Signed)
Chief Complaint:  midepigastric abdominal pain  History of Present Illness:  Hayley Hogan is an 66 y.o. female  Who is referred by Dr. Brunilda Payor with midepigastric abdominal pain and gallstones on ultrasound. These were found when she had a routine screening exam. There is no evidence of cholecystitis.  We had a lengthy discussion about the other tract disease I think she has been symptomatic for a long time. Unfortunately she had to put her brother and hospice this week and between caring for him chronically and caring for a 16 year old mother she has a lot on her plate right now. I gave her information about laparoscopic cholecystectomy and its complications unlimited the common duct injury. I think we will d up through the impending death of her brother and works to that in all see her back in the office and we can hopefully schedule elective cholecystectomy.  Past Medical History  Diagnosis Date  . Hypertension   . Neuromuscular disorder   . Migraines     "when I was very young; before 1968"  . Rheumatoid arthritis   . Transverse myelitis 1979    w/transient inability to walk    Past Surgical History  Procedure Date  . Vaginal hysterectomy 1982  . Orif ankle fracture 09/2008    right  . Breast lumpectomy 1970    right    Current Outpatient Prescriptions  Medication Sig Dispense Refill  . CALCIUM PO Take 1 tablet by mouth daily.      . folic acid (FOLVITE) 1 MG tablet Take 1 mg by mouth daily.      Marland Kitchen HYDROcodone-acetaminophen (NORCO) 5-325 MG per tablet Take 1 tablet by mouth every 6 (six) hours as needed.      . hydroxychloroquine (PLAQUENIL) 200 MG tablet Take 200 mg by mouth 2 (two) times daily.      Marland Kitchen losartan-hydrochlorothiazide (HYZAAR) 100-12.5 MG per tablet Take 1 tablet by mouth daily.      . methotrexate (RHEUMATREX) 2.5 MG tablet Take 5 mg by mouth once a week. Caution:Chemotherapy. Protect from light.      . metoprolol (LOPRESSOR) 50 MG tablet Take 1 tablet (50 mg  total) by mouth 2 (two) times daily.  60 tablet  11  . Multiple Vitamin (MULITIVITAMIN WITH MINERALS) TABS Take 1 tablet by mouth daily.      . ondansetron (ZOFRAN) 8 MG tablet Take by mouth every 8 (eight) hours as needed.      . predniSONE (DELTASONE) 5 MG tablet Take 1 tablet (5 mg total) by mouth daily.  30 tablet  11  . traMADol (ULTRAM) 50 MG tablet Take 50 mg by mouth every 6 (six) hours as needed.      . verapamil (CALAN-SR) 120 MG CR tablet Take 1 tablet (120 mg total) by mouth daily.  30 tablet  11   Demerol No family history on file. Social History:   reports that she quit smoking about a year ago. Her smoking use included Cigarettes. She has a 17.5 pack-year smoking history. She has never used smokeless tobacco. She reports that she drinks about .6 ounces of alcohol per week. She reports that she uses illicit drugs (Marijuana).   REVIEW OF SYSTEMS - PERTINENT POSITIVES ONLY: noncontributory  Physical Exam:   Blood pressure 102/70, pulse 62, temperature 97.5 F (36.4 C), temperature source Temporal, resp. rate 18, height 5' 7.5" (1.715 m), weight 186 lb 8 oz (84.596 kg). Body mass index is 28.78 kg/(m^2).  Gen:  WDWN WF NAD  Neurological: Alert and oriented to person, place, and time. Motor and sensory function is grossly intact  No abdominal pain or complaints at this time.   LABORATORY RESULTS: No results found for this or any previous visit (from the past 48 hour(s)).  RADIOLOGY RESULTS: No results found.  Problem List: Patient Active Problem List  Diagnosis  . Headache  . Nausea and vomiting  . Hypertension  . Rheumatoid arthritis    Assessment & Plan: Symptomatic chronic cholecystitis.  Will see back in 6 weeks to discuss lap chole and possibly schedule    Matt B. Daphine Deutscher, MD, [facility] [phone] beeper [phone]  01/07/2012 10:27 AM

## 2012-01-07 NOTE — Patient Instructions (Signed)

## 2012-02-18 ENCOUNTER — Ambulatory Visit (INDEPENDENT_AMBULATORY_CARE_PROVIDER_SITE_OTHER): Payer: BC Managed Care – PPO | Admitting: Surgery

## 2012-02-18 ENCOUNTER — Encounter (INDEPENDENT_AMBULATORY_CARE_PROVIDER_SITE_OTHER): Payer: Self-pay | Admitting: Surgery

## 2012-02-18 VITALS — BP 120/78 | HR 64 | Temp 97.3°F | Resp 16 | Ht 67.5 in | Wt 190.0 lb

## 2012-02-18 DIAGNOSIS — K811 Chronic cholecystitis: Secondary | ICD-10-CM

## 2012-02-18 NOTE — Patient Instructions (Signed)
Followup with Dr. Daphine Deutscher if symptoms get worse  Cholecystitis  Cholecystitis is swelling and irritation (inflammation) of your gallbladder. This often happens when gallstones or sludge build up in the gallbladder. Treatment is needed right away. HOME CARE Home care depends on how you were treated. In general:  If you were given antibiotic medicine, take it as told. Finish the medicine even if you start to feel better.   Only take medicines as told by your doctor.   Eat low-fat foods until your next doctor visit.   Keep all doctor visits as told.  GET HELP RIGHT AWAY IF:  You have more pain and medicine does not help.   Your pain moves to a different part of your belly (abdomen) or to your back.   You have a fever.   You feel sick to your stomach (nauseous).   You throw up (vomit).  MAKE SURE YOU:  Understand these instructions.   Will watch your condition.   Will get help right away if you are not doing well or get worse.  Document Released: 05/21/2011 Document Reviewed: 05/19/2011 [facility] Patient Information 2012 [place], [place].

## 2012-02-18 NOTE — Progress Notes (Signed)
Chief Complaint:  midepigastric abdominal pain  History of Present Illness:  Hayley Hogan is an 66 y.o. female  Who is referred by Dr. Brunilda Payor with midepigastric abdominal pain and gallstones on ultrasound. These were found when she had a routine screening exam. There is no evidence of cholecystitis.  We had a lengthy discussion about the other tract disease I think she has been symptomatic for a long time. Unfortunately she had to put her brother and hospice this week and between caring for him chronically and caring for a 73 year old mother she has a lot on her plate right now. I gave her information about laparoscopic cholecystectomy and its complications unlimited the common duct injury. I think we will d up through the impending death of her brother and works to that in all see her back in the office and we can hopefully schedule elective cholecystectomy.  Past Medical History  Diagnosis Date  . Hypertension   . Neuromuscular disorder   . Migraines     "when I was very young; before 1968"  . Rheumatoid arthritis   . Transverse myelitis 1979    w/transient inability to walk    Past Surgical History  Procedure Date  . Vaginal hysterectomy 1982  . Orif ankle fracture 09/2008    right  . Breast lumpectomy 1970    right    Current Outpatient Prescriptions  Medication Sig Dispense Refill  . CALCIUM PO Take 1 tablet by mouth daily.      . folic acid (FOLVITE) 1 MG tablet Take 1 mg by mouth daily.      Marland Kitchen HYDROcodone-acetaminophen (NORCO) 5-325 MG per tablet Take 1 tablet by mouth every 6 (six) hours as needed.      . hydroxychloroquine (PLAQUENIL) 200 MG tablet Take 200 mg by mouth 2 (two) times daily.      Marland Kitchen losartan-hydrochlorothiazide (HYZAAR) 100-12.5 MG per tablet Take 1 tablet by mouth daily.      . methotrexate (RHEUMATREX) 2.5 MG tablet Take 5 mg by mouth once a week. Caution:Chemotherapy. Protect from light.      . metoprolol (LOPRESSOR) 50 MG tablet Take 1 tablet (50 mg  total) by mouth 2 (two) times daily.  60 tablet  11  . Multiple Vitamin (MULITIVITAMIN WITH MINERALS) TABS Take 1 tablet by mouth daily.      . traMADol (ULTRAM) 50 MG tablet Take 50 mg by mouth every 6 (six) hours as needed.      . verapamil (CALAN-SR) 120 MG CR tablet Take 1 tablet (120 mg total) by mouth daily.  30 tablet  11   Demerol History reviewed. No pertinent family history. Social History:   reports that she quit smoking about 13 months ago. Her smoking use included Cigarettes. She has a 17.5 pack-year smoking history. She has never used smokeless tobacco. She reports that she drinks about .6 ounces of alcohol per week. She reports that she uses illicit drugs (Marijuana).   REVIEW OF SYSTEMS - PERTINENT POSITIVES ONLY: noncontributory  Physical Exam:   Blood pressure 120/78, pulse 64, temperature 97.3 F (36.3 C), temperature source Temporal, resp. rate 16, height 5' 7.5" (1.715 m), weight 190 lb (86.183 kg). Body mass index is 29.32 kg/(m^2).  Gen:  WDWN WF NAD  Neurological: Alert and oriented to person, place, and time. Motor and sensory function is grossly intact  No abdominal pain or complaints at this time.   LABORATORY RESULTS: No results found for this or any previous visit (from the past  48 hour(s)).  RADIOLOGY RESULTS: No results found.  Problem List: Patient Active Problem List  Diagnosis  . Headache  . Nausea and vomiting  . Hypertension  . Rheumatoid arthritis    Assessment & Plan: Symptomatic chronic cholecystitis.  Will see back in 6 weeks to discuss lap chole and possibly schedule    Matt B. Daphine Deutscher, MD, [facility] [phone] beeper [phone]  02/18/2012 11:11 AM  [name] is brother passed soon after I saw her last time. She seems to be getting along fairly well he has occasional discomfort which he manages with Pepto-Bismol.  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  She feels pretty good . I think she would  like to wait and see if this gives her a problem. I would be happy to see her and schedule her for cholecystectomy if she becomes more symptomatic.

## 2013-05-09 ENCOUNTER — Inpatient Hospital Stay (HOSPITAL_COMMUNITY)
Admission: EM | Admit: 2013-05-09 | Discharge: 2013-05-11 | DRG: 378 | Disposition: A | Discharge status: Home / Self Care | Payer: BC Managed Care – PPO | Attending: Internal Medicine | Admitting: Internal Medicine

## 2013-05-09 ENCOUNTER — Encounter (HOSPITAL_COMMUNITY): Payer: Self-pay | Admitting: Emergency Medicine

## 2013-05-09 ENCOUNTER — Emergency Department (HOSPITAL_COMMUNITY): Payer: BC Managed Care – PPO

## 2013-05-09 DIAGNOSIS — I700 Atherosclerosis of aorta: Secondary | ICD-10-CM

## 2013-05-09 DIAGNOSIS — K8010 Calculus of gallbladder with chronic cholecystitis without obstruction: Secondary | ICD-10-CM | POA: Diagnosis present

## 2013-05-09 DIAGNOSIS — F1210 Cannabis abuse, uncomplicated: Secondary | ICD-10-CM | POA: Diagnosis present

## 2013-05-09 DIAGNOSIS — K7460 Unspecified cirrhosis of liver: Secondary | ICD-10-CM

## 2013-05-09 DIAGNOSIS — I2510 Atherosclerotic heart disease of native coronary artery without angina pectoris: Secondary | ICD-10-CM | POA: Diagnosis present

## 2013-05-09 DIAGNOSIS — E119 Type 2 diabetes mellitus without complications: Secondary | ICD-10-CM | POA: Diagnosis present

## 2013-05-09 DIAGNOSIS — F17200 Nicotine dependence, unspecified, uncomplicated: Secondary | ICD-10-CM | POA: Diagnosis present

## 2013-05-09 DIAGNOSIS — K5730 Diverticulosis of large intestine without perforation or abscess without bleeding: Secondary | ICD-10-CM | POA: Diagnosis present

## 2013-05-09 DIAGNOSIS — K922 Gastrointestinal hemorrhage, unspecified: Secondary | ICD-10-CM | POA: Diagnosis present

## 2013-05-09 DIAGNOSIS — K7689 Other specified diseases of liver: Secondary | ICD-10-CM | POA: Diagnosis present

## 2013-05-09 DIAGNOSIS — D126 Benign neoplasm of colon, unspecified: Secondary | ICD-10-CM

## 2013-05-09 DIAGNOSIS — R911 Solitary pulmonary nodule: Secondary | ICD-10-CM

## 2013-05-09 DIAGNOSIS — M069 Rheumatoid arthritis, unspecified: Secondary | ICD-10-CM | POA: Diagnosis present

## 2013-05-09 DIAGNOSIS — D696 Thrombocytopenia, unspecified: Secondary | ICD-10-CM

## 2013-05-09 DIAGNOSIS — K648 Other hemorrhoids: Secondary | ICD-10-CM | POA: Diagnosis present

## 2013-05-09 DIAGNOSIS — M8580 Other specified disorders of bone density and structure, unspecified site: Secondary | ICD-10-CM

## 2013-05-09 DIAGNOSIS — I10 Essential (primary) hypertension: Secondary | ICD-10-CM | POA: Diagnosis present

## 2013-05-09 DIAGNOSIS — M899 Disorder of bone, unspecified: Secondary | ICD-10-CM | POA: Diagnosis present

## 2013-05-09 DIAGNOSIS — D128 Benign neoplasm of rectum: Secondary | ICD-10-CM | POA: Diagnosis present

## 2013-05-09 HISTORY — DX: Fatty (change of) liver, not elsewhere classified: K76.0

## 2013-05-09 HISTORY — DX: Type 2 diabetes mellitus without complications: E11.9

## 2013-05-09 LAB — COMPREHENSIVE METABOLIC PANEL
ALT: 25 U/L (ref 0–35)
BUN: 12 mg/dL (ref 6–23)
CO2: 24 mEq/L (ref 19–32)
Calcium: 9 mg/dL (ref 8.4–10.5)
Creatinine, Ser: 0.8 mg/dL (ref 0.50–1.10)
GFR calc Af Amer: 88 mL/min — ABNORMAL LOW (ref >90)
GFR calc non Af Amer: 76 mL/min — ABNORMAL LOW (ref >90)
Glucose, Bld: 132 mg/dL — ABNORMAL HIGH (ref 70–99)
Potassium: 4.1 mEq/L (ref 3.5–5.1)
Sodium: 140 mEq/L (ref 135–145)
Total Bilirubin: 0.2 mg/dL — ABNORMAL LOW (ref 0.3–1.2)
Total Protein: 6.9 g/dL (ref 6.0–8.3)

## 2013-05-09 LAB — URINALYSIS, ROUTINE W REFLEX MICROSCOPIC
Bilirubin Urine: NEGATIVE
Ketones, ur: NEGATIVE mg/dL
Nitrite: NEGATIVE
Protein, ur: NEGATIVE mg/dL
Specific Gravity, Urine: 1.017 (ref 1.005–1.030)
Urobilinogen, UA: 1 mg/dL (ref 0.0–1.0)
pH: 5.5 (ref 5.0–8.0)

## 2013-05-09 LAB — CBC WITH DIFFERENTIAL/PLATELET
Eosinophils Absolute: 0.1 10*3/uL (ref 0.0–0.7)
Eosinophils Relative: 2 % (ref 0–5)
HCT: 40.8 % (ref 36.0–46.0)
Hemoglobin: 13.9 g/dL (ref 12.0–15.0)
Lymphocytes Relative: 40 % (ref 12–46)
Lymphs Abs: 2.4 10*3/uL (ref 0.7–4.0)
MCH: 31.1 pg (ref 26.0–34.0)
MCHC: 34.1 g/dL (ref 30.0–36.0)
MCV: 91.3 fL (ref 78.0–100.0)
Monocytes Absolute: 0.4 10*3/uL (ref 0.1–1.0)
Monocytes Relative: 7 % (ref 3–12)
Neutro Abs: 3 10*3/uL (ref 1.7–7.7)
Platelets: 135 10*3/uL — ABNORMAL LOW (ref 150–400)
RBC: 4.47 MIL/uL (ref 3.87–5.11)
WBC: 6 10*3/uL (ref 4.0–10.5)

## 2013-05-09 LAB — URINE MICROSCOPIC-ADD ON

## 2013-05-09 LAB — SAMPLE TO BLOOD BANK

## 2013-05-09 LAB — APTT: aPTT: 25 seconds (ref 24–37)

## 2013-05-09 LAB — GLUCOSE, CAPILLARY: Glucose-Capillary: 132 mg/dL — ABNORMAL HIGH (ref 70–99)

## 2013-05-09 MED ORDER — PANTOPRAZOLE SODIUM 40 MG IV SOLR
40.0000 mg | Freq: Every day | INTRAVENOUS | Status: DC
  Administered 2013-05-10: 02:00:00 40 mg via INTRAVENOUS
  Filled 2013-05-09 (×2): qty 40

## 2013-05-09 MED ORDER — VITAMIN D (ERGOCALCIFEROL) 1.25 MG (50000 UNIT) PO CAPS
50000.0000 [IU] | ORAL_CAPSULE | ORAL | Status: DC
  Administered 2013-05-10: 50000 [IU] via ORAL
  Filled 2013-05-09 (×3): qty 1

## 2013-05-09 MED ORDER — INSULIN ASPART 100 UNIT/ML ~~LOC~~ SOLN
0.0000 [IU] | Freq: Three times a day (TID) | SUBCUTANEOUS | Status: DC
  Administered 2013-05-10: 1 [IU] via SUBCUTANEOUS
  Administered 2013-05-10: 12:00:00 2 [IU] via SUBCUTANEOUS

## 2013-05-09 MED ORDER — IOHEXOL 300 MG/ML  SOLN
80.0000 mL | Freq: Once | INTRAMUSCULAR | Status: AC | PRN
  Administered 2013-05-09: 80 mL via INTRAVENOUS

## 2013-05-09 MED ORDER — FOLIC ACID 1 MG PO TABS
800.0000 ug | ORAL_TABLET | Freq: Every day | ORAL | Status: DC
  Administered 2013-05-10 – 2013-05-11 (×2): 1 mg via ORAL
  Filled 2013-05-09 (×2): qty 1

## 2013-05-09 MED ORDER — HYDROXYCHLOROQUINE SULFATE 200 MG PO TABS
200.0000 mg | ORAL_TABLET | Freq: Two times a day (BID) | ORAL | Status: DC
  Administered 2013-05-10 (×3): 200 mg via ORAL
  Filled 2013-05-09 (×5): qty 1

## 2013-05-09 MED ORDER — IOHEXOL 300 MG/ML  SOLN
25.0000 mL | INTRAMUSCULAR | Status: AC
  Administered 2013-05-09: 25 mL via ORAL

## 2013-05-09 MED ORDER — HYDROCODONE-ACETAMINOPHEN 5-325 MG PO TABS: 1.0000 | ORAL_TABLET | Freq: Four times a day (QID) | ORAL | Status: DC | PRN

## 2013-05-09 MED ORDER — METOPROLOL TARTRATE 50 MG PO TABS
50.0000 mg | ORAL_TABLET | Freq: Two times a day (BID) | ORAL | Status: DC
  Administered 2013-05-10 – 2013-05-11 (×4): 50 mg via ORAL
  Filled 2013-05-09 (×5): qty 1

## 2013-05-09 MED ORDER — ADULT MULTIVITAMIN W/MINERALS CH
1.0000 | ORAL_TABLET | Freq: Every day | ORAL | Status: DC
  Administered 2013-05-10 – 2013-05-11 (×2): 1 via ORAL
  Filled 2013-05-09 (×2): qty 1

## 2013-05-09 MED ORDER — SODIUM CHLORIDE 0.9 % IV SOLN
INTRAVENOUS | Status: DC
  Administered 2013-05-09 – 2013-05-11 (×3): via INTRAVENOUS
  Administered 2013-05-11: 500 mL via INTRAVENOUS

## 2013-05-09 MED ORDER — TRAMADOL HCL 50 MG PO TABS: 50.0000 mg | ORAL_TABLET | Freq: Four times a day (QID) | ORAL | Status: DC | PRN

## 2013-05-09 NOTE — ED Notes (Signed)
Pt c/o BRB rectal bleeding x 4 episodes today; pt sts some weakness and lower abd pain

## 2013-05-09 NOTE — H&P (Signed)
PCP:   Hayley Fillers, MD   Chief Complaint:  Rectal bleeding  HPI:  Is a 67 year old white female with no past GI history of present study for GI bleeding. She will this morning no difficulties and went to the bathroom. She passed a bloody stool with bright red blood with no stool mixed. She had a cramping pain in the left lower quadrant associated with this. She had no nausea or vomiting. She did not have weakness palpitations or sweating during this episode. She's had roughly 5 episodes since then all of which were very similar. She still has a bit of tenderness in the left lower quadrant which she had been blaming on a hernia in this area. She's never seen any blood in her stool prior to today. She's never had any GI investigations.  She's had an intentional weight loss of 20 pounds in the past year. She notes no reflux. She has no chronic nausea or vomiting. Her blood sugars had been doing well. She's had no neuropathic complaints. She notes no headache. She's not been weak. She's had no chest pain or shortness of breath. No fever family has had similar difficulties. Her mother is alive at 46. Not orthostatic in the office  Review of Systems:  Review of Systems - neg except as above Past Medical History: Past Medical History  Diagnosis Date  . Hypertension   . Neuromuscular disorder   . Migraines     "when I was very young; before 1968"  . Rheumatoid arthritis(714.0)   . Transverse myelitis 1979    w/transient inability to walk   Past Surgical History  Procedure Laterality Date  . Vaginal hysterectomy  1982  . Orif ankle fracture  09/2008    right  . Breast lumpectomy  1970    right    Medications: Prior to Admission medications   Medication Sig Start Date End Date Taking? Authorizing Provider  folic acid (FOLVITE) 800 MCG tablet Take 800 mcg by mouth daily.   Yes Historical Provider, MD  HYDROcodone-acetaminophen (NORCO) 5-325 MG per tablet Take 1 tablet by mouth every  6 (six) hours as needed for moderate pain.    Yes Historical Provider, MD  hydroxychloroquine (PLAQUENIL) 200 MG tablet Take 200 mg by mouth 2 (two) times daily.   Yes Historical Provider, MD  metFORMIN (GLUCOPHAGE) 500 MG tablet Take 500 mg by mouth every evening.   Yes Historical Provider, MD  methotrexate (RHEUMATREX) 2.5 MG tablet Take 12.5 mg by mouth once a week.   Yes Historical Provider, MD  metoprolol (LOPRESSOR) 50 MG tablet Take 50 mg by mouth 2 (two) times daily.   Yes Historical Provider, MD  Multiple Vitamin (MULITIVITAMIN WITH MINERALS) TABS Take 1 tablet by mouth daily.   Yes Historical Provider, MD  traMADol (ULTRAM) 50 MG tablet Take 50 mg by mouth every 6 (six) hours as needed for moderate pain.    Yes Historical Provider, MD  Vitamin D, Ergocalciferol, (DRISDOL) 50000 UNITS CAPS capsule Take 50,000 Units by mouth every 7 (seven) days.   Yes Historical Provider, MD    Allergies:   Allergies  Allergen Reactions  . Demerol Other (See Comments)    vomiting  . Peanut-Containing Drug Products     Mouth swelling    Social History:  reports that she has been smoking Cigarettes.  She has a 17.5 pack-year smoking history. She has never used smokeless tobacco. She reports that she drinks about 0.6 ounces of alcohol per week. She reports that  she uses illicit drugs (Marijuana).  She quit smoking 3 years ago.  Family History: History reviewed. No pertinent family history.  Physical Exam: Filed Vitals:   05/09/13 2054 05/09/13 2100 05/09/13 2115 05/09/13 2130  BP: 146/81 129/74 136/77 140/72  Pulse: 72 72 66 66  Temp:      TempSrc:      Resp: 16     SpO2: 99% 99% 99% 98%   General appearance: White female lying flat in no distress. Sclera anicteric. Extraocular movements are intact. Discs are sharp bilaterally. Oral mucous membranes are moist and there no oral telangiectasia. Dentition is in good repair.  Neck: no adenopathy, no carotid bruit, no JVD and thyroid not  enlarged, symmetric, no tenderness/mass/nodules Resp: Clear with no wheezes rales or rhonchi. No accessory muscles are in use. Cardio: Regular systolic murmur. GI: soft, tender in the left lower quadrant There is no rebound. No masses or pulsations.; bowel sounds slightly hypoactive. no masses,  no organomegaly Extremities: extremities normal, atraumatic, no cyanosis or edema Pulses: Strong distal pulses Lymph nodes: Cervical adenopathy: no cervical lymphadenopathy Neurologic: Alert and oriented X 3, mentation is good. Speech is clear and fluent. normal strength and tone. Normal symmetric reflexes.     Labs on Admission:   Recent Labs  05/09/13 1653  NA 140  K 4.1  CL 102  CO2 24  GLUCOSE 132*  BUN 12  CREATININE 0.80  CALCIUM 9.0    Recent Labs  05/09/13 1653  AST 20  ALT 25  ALKPHOS 74  BILITOT 0.2*  PROT 6.9  ALBUMIN 3.7     Recent Labs  05/09/13 1653  WBC 6.0  NEUTROABS 3.0  HGB 13.9  HCT 40.8  MCV 91.3  PLT 135*    Lab Results  Component Value Date   INR 0.92 05/09/2013   INR 1.0 09/22/2008      Radiological Exams on Admission: Ct Abdomen Pelvis W Contrast  05/09/2013   CLINICAL DATA:  Rectal bleeding x4 today. Weakness and lower abdominal pain. Smoker.  EXAM: CT ABDOMEN AND PELVIS WITH CONTRAST  TECHNIQUE: Multidetector CT imaging of the abdomen and pelvis was performed using the standard protocol following bolus administration of intravenous contrast.  CONTRAST:  80mL OMNIPAQUE IOHEXOL 300 MG/ML  SOLN  COMPARISON:  11/28/2009 abdominal ultrasound.  FINDINGS: Lower Chest: 4 mm right lower lobe lung nodule on image 11. Heart size upper normal, without pericardial or pleural effusion. Right coronary artery atherosclerosis.  Abdomen/Pelvis: Mild cirrhosis, as evidenced by an irregular hepatic contour and prominence of the caudate and lateral segment left liver lobes. No focal liver lesion. Normal spleen, , right adrenal gland. Mild pancreatic atrophy.  Cholelithiasis without acute cholecystitis or biliary ductal dilatation. A suprarenal heterogeneous 2.9 cm density on image 20 represents a gastric diverticulum, when correlated with sagittal re-formatted image 88. Normal left adrenal gland.  Upper pole left renal scarring.  Normal right kidney.  Aortic and branch vessel atherosclerosis. Non aneurysmal dilatation of the infrarenal aorta at 2.4 cm.  No retroperitoneal or retrocrural adenopathy. Extensive colonic diverticulosis. Redundant sigmoid. Normal terminal ileum and appendix. Normal small bowel without abdominal ascites. No pelvic adenopathy. Normal urinary bladder. Hysterectomy. No adnexal mass or significant free fluid.  Bones/Musculoskeletal:  Mild osteopenia.  IMPRESSION: 1.  No acute process or explanation for rectal bleeding. 2. Mild cirrhosis. 3. Cholelithiasis. 4. Age advanced coronary artery atherosclerosis. Recommend assessment of coronary risk factors and consideration of medical therapy. 5. Gastric diverticulum. 6. Aortic advanced atherosclerosis with non  aneurysmal infrarenal dilatation. 7. 4 mm right lower lobe lung nodule. Given risk factors for bronchogenic carcinoma, follow-up chest CT at 1 year is recommended. This recommendation follows the consensus statement: "Guidelines for Management of Small Pulmonary Nodules Detected on CT Scans: A Statement from the Fleischner Society" as published in Radiology 2005; 237:395-400. Available online at: DietDisorder.cz.   Electronically Signed   By: Jeronimo Greaves M.D.   On: 05/09/2013 20:51   Orders placed during the hospital encounter of 10/01/11  . EKG 12-LEAD  . EKG 12-LEAD    Assessment/Plan Principal Problem:   Lower GI bleed: This is a first-time episode of GI bleeding. Appears to be strictly a lower GI bleed. Patient is hemodynamically stable and is not anemic at the present time. The fact that this is bright red blood suggests that this is most likely  a diverticular bleed. Certainly arteriovenous malformations could be present as well. The fact that she's lost weight and has abdominal pain could make this a malignant process but the CT does not suggest this. GI is been consulted and plans to see the patient tomorrow. We'll hydrate and watch serial CBCs. IV protonix will be added although i'm not sure that all give as much.  Patient will be n.p.o. Active Problems:   Rheumatoid arthritis(714.0): We'll hold her medications the present time.   Type II or unspecified type diabetes mellitus without mention of complication, not stated as uncontrolled: We will cover with sliding scale.   Essential hypertension, benign: The patient got any medications and just today of the beta blocker will be resumed. CIRRHOSIS: This is a new unexpected finding. This could lead to more GI bleeding issues. Platelets and white count however fine. Pro time is normal as well. Hepatitis serology may need to be checked. CHOLELITHIASIS: This appears to be an incidental finding. She has no pain related to this. CORONARY calcifications: We need to decide if this needs to be worked up as an outpatient. She has atherosclerosis both her cardiac and aortic areas. PULMONARY NODULE: This will need followup per the recommendations above. OSTEOPENIA:  Likely due to rheumatoid arthritis and medications for this. Outpatient management is indicated           Chinaza Rooke ALAN 05/09/2013, 9:58 PM

## 2013-05-09 NOTE — ED Provider Notes (Signed)
CSN: [id number]     Arrival date & time 05/09/13  1621 History   First MD Initiated Contact with Patient 05/09/13 1654     Chief Complaint  Patient presents with  . Rectal Bleeding   (Consider location/radiation/quality/duration/timing/severity/associated sxs/prior Treatment) Patient is a 67 y.o. female presenting with hematochezia. The history is provided by the patient. No language interpreter was used.  Rectal Bleeding Quality:  Bright red Amount:  Copious Duration:  1 day Timing:  Intermittent Context: not anal fissures and not diarrhea   Similar prior episodes: no   Relieved by:  Nothing Worsened by:  Nothing tried Associated symptoms: abdominal pain   Associated symptoms: no fever   Associated symptoms comment:  She reports normal bowel movement yesterday that was soft, easy to pass without straining. Today she felt she had to move her bowels and passed only blood, "a Pint". No stool. She has been x 2 since and had similar results, commenting on having clots in the last movement. She denies rectal pain. She endorses LLQ discomfort. No history of GI bleeding, or diverticulitis. She denies ever having a colonoscopy. No nausea, vomiting or fever.    Past Medical History  Diagnosis Date  . Hypertension   . Neuromuscular disorder   . Migraines     "when I was very young; before 1968"  . Rheumatoid arthritis(714.0)   . Transverse myelitis 1979    w/transient inability to walk   Past Surgical History  Procedure Laterality Date  . Vaginal hysterectomy  1982  . Orif ankle fracture  09/2008    right  . Breast lumpectomy  1970    right   History reviewed. No pertinent family history. History  Substance Use Topics  . Smoking status: Current Every Day Smoker -- 0.50 packs/day for 35 years    Types: Cigarettes    Last Attempt to Quit: 01/14/2011  . Smokeless tobacco: Never Used  . Alcohol Use: 0.6 oz/week    1 Glasses of wine per week   OB History   Grav Para Term Preterm  Abortions TAB SAB Ect Mult Living                 Review of Systems  Constitutional: Negative for fever and chills.  Respiratory: Negative.  Negative for shortness of breath.   Cardiovascular: Negative.  Negative for chest pain.  Gastrointestinal: Positive for abdominal pain, blood in stool and hematochezia. Negative for rectal pain.  Genitourinary: Negative.  Negative for dysuria and hematuria.  Musculoskeletal: Negative.  Negative for back pain and myalgias.  Skin: Negative.   Neurological: Negative.   Psychiatric/Behavioral: Negative.  Negative for confusion.    Allergies  Demerol and Peanut-containing drug products  Home Medications   Current Outpatient Rx  Name  Route  Sig  Dispense  Refill  . folic acid (FOLVITE) 800 MCG tablet   Oral   Take 800 mcg by mouth daily.         Marland Kitchen HYDROcodone-acetaminophen (NORCO) 5-325 MG per tablet   Oral   Take 1 tablet by mouth every 6 (six) hours as needed for moderate pain.          . hydroxychloroquine (PLAQUENIL) 200 MG tablet   Oral   Take 200 mg by mouth 2 (two) times daily.         . metFORMIN (GLUCOPHAGE) 500 MG tablet   Oral   Take 500 mg by mouth every evening.         . methotrexate (RHEUMATREX)  2.5 MG tablet   Oral   Take 12.5 mg by mouth once a week.         . metoprolol (LOPRESSOR) 50 MG tablet   Oral   Take 50 mg by mouth 2 (two) times daily.         . Multiple Vitamin (MULITIVITAMIN WITH MINERALS) TABS   Oral   Take 1 tablet by mouth daily.         . traMADol (ULTRAM) 50 MG tablet   Oral   Take 50 mg by mouth every 6 (six) hours as needed for moderate pain.          . Vitamin D, Ergocalciferol, (DRISDOL) 50000 UNITS CAPS capsule   Oral   Take 50,000 Units by mouth every 7 (seven) days.          BP 156/88  Pulse 88  Temp(Src) 99.3 F (37.4 C) (Oral)  Resp 16  SpO2 99% Physical Exam  Constitutional: She is oriented to person, place, and time. She appears well-developed and  well-nourished.  HENT:  Head: Normocephalic.  Neck: Normal range of motion. Neck supple.  Cardiovascular: Normal rate and regular rhythm.   Pulmonary/Chest: Effort normal and breath sounds normal.  Abdominal: Soft. Bowel sounds are normal. There is tenderness. There is no rebound and no guarding.  Mild LLQ tenderness.   Musculoskeletal: Normal range of motion.  Neurological: She is alert and oriented to person, place, and time.  Skin: Skin is warm and dry. No rash noted. No pallor.  Psychiatric: She has a normal mood and affect.    ED Course  Procedures (including critical care time) Labs Review Labs Reviewed  CBC WITH DIFFERENTIAL - Abnormal; Notable for the following:    Platelets 135 (*)    All other components within normal limits  COMPREHENSIVE METABOLIC PANEL - Abnormal; Notable for the following:    Glucose, Bld 132 (*)    Total Bilirubin 0.2 (*)    GFR calc non Af Amer 76 (*)    GFR calc Af Amer 88 (*)    All other components within normal limits  URINALYSIS, ROUTINE W REFLEX MICROSCOPIC - Abnormal; Notable for the following:    Leukocytes, UA SMALL (*)    All other components within normal limits  URINE MICROSCOPIC-ADD ON - Abnormal; Notable for the following:    Squamous Epithelial / LPF FEW (*)    All other components within normal limits  OCCULT BLOOD, POC DEVICE - Abnormal; Notable for the following:    Fecal Occult Bld POSITIVE (*)    All other components within normal limits  PROTIME-INR  APTT  SAMPLE TO BLOOD BANK   Imaging Review No results found.  EKG Interpretation   None       MDM  No diagnosis found. 1. Lower GI bleeding  Multiple episodes of blood per rectum today wtihout history of same. Discussed with Dr. Juanda Chance who will consult and requests medicine to admit. Discussed with Dr. Evlyn Kanner who accepts patient for admission.   Arnoldo Barbato, PA-C 05/10/13 2135

## 2013-05-09 NOTE — ED Notes (Signed)
Lab called to draw blood from pt

## 2013-05-10 ENCOUNTER — Encounter (HOSPITAL_COMMUNITY): Payer: Self-pay | Admitting: Physician Assistant

## 2013-05-10 DIAGNOSIS — K922 Gastrointestinal hemorrhage, unspecified: Principal | ICD-10-CM

## 2013-05-10 LAB — CBC
HCT: 37.3 % (ref 36.0–46.0)
Hemoglobin: 12.4 g/dL (ref 12.0–15.0)
Hemoglobin: 12.2 g/dL (ref 12.0–15.0)
MCH: 30.8 pg (ref 26.0–34.0)
MCHC: 33.2 g/dL (ref 30.0–36.0)
MCV: 92.6 fL (ref 78.0–100.0)
MCV: 90.6 fL (ref 78.0–100.0)
Platelets: 126 10*3/uL — ABNORMAL LOW (ref 150–400)
RBC: 4.03 MIL/uL (ref 3.87–5.11)
RBC: 3.94 MIL/uL (ref 3.87–5.11)
RDW: 13.5 % (ref 11.5–15.5)
WBC: 5.2 10*3/uL (ref 4.0–10.5)
WBC: 4.3 10*3/uL (ref 4.0–10.5)

## 2013-05-10 LAB — GLUCOSE, CAPILLARY
Glucose-Capillary: 102 mg/dL — ABNORMAL HIGH (ref 70–99)
Glucose-Capillary: 92 mg/dL (ref 70–99)

## 2013-05-10 LAB — COMPREHENSIVE METABOLIC PANEL
AST: 16 U/L (ref 0–37)
Alkaline Phosphatase: 64 U/L (ref 39–117)
BUN: 9 mg/dL (ref 6–23)
CO2: 25 mEq/L (ref 19–32)
Chloride: 107 mEq/L (ref 96–112)
GFR calc Af Amer: 86 mL/min — ABNORMAL LOW (ref >90)
GFR calc non Af Amer: 75 mL/min — ABNORMAL LOW (ref >90)
Glucose, Bld: 126 mg/dL — ABNORMAL HIGH (ref 70–99)
Potassium: 4 mEq/L (ref 3.5–5.1)
Total Bilirubin: 0.3 mg/dL (ref 0.3–1.2)

## 2013-05-10 MED ORDER — METOCLOPRAMIDE HCL 5 MG PO TABS
5.0000 mg | ORAL_TABLET | Freq: Once | ORAL | Status: AC
  Administered 2013-05-10: 18:00:00 5 mg via ORAL
  Filled 2013-05-10: qty 1

## 2013-05-10 MED ORDER — SODIUM CHLORIDE 0.9 % IV SOLN: INTRAVENOUS | Status: DC

## 2013-05-10 MED ORDER — PEG-KCL-NACL-NASULF-NA ASC-C 100 G PO SOLR: 1.0000 | Freq: Once | ORAL | Status: DC

## 2013-05-10 MED ORDER — METOCLOPRAMIDE HCL 5 MG PO TABS
5.0000 mg | ORAL_TABLET | Freq: Once | ORAL | Status: AC
  Administered 2013-05-11: 5 mg via ORAL
  Filled 2013-05-10: qty 1

## 2013-05-10 MED ORDER — PEG-KCL-NACL-NASULF-NA ASC-C 100 G PO SOLR
0.5000 | ORAL | Status: DC
  Administered 2013-05-10 – 2013-05-11 (×2): 100 g via ORAL
  Filled 2013-05-10 (×4): qty 1

## 2013-05-10 NOTE — Consult Note (Addendum)
[facility] Consult: 9:07 AM 05/10/2013  LOS: 1 day    Referring Provider: Dr Evlyn Kanner  Primary Care Physician:  Garlan Fillers, MD Primary Gastroenterologist:  None     Reason for Consultation:  Hematochezia.     HPI: Hayley Hogan is a 67 y.o. female.  Hx rheumatoid arthritis treated with MTX. DM2 controlled with oral agents and diet.  MTX has caused rise in LFTs in past but LFTs normalized so long as she stays on no more than 12.5 mg per week.  Past ultrasounds show fatty liver.  Never has GI issues, never had an EGD or colonoscopy.  Yesterday around 930 had some mid abdominal crampy discomfort and then passed a loose, "messy" stool that leached some blood into commode.  She felt fatigued but not dixxy or nauseated. The next BM about 10 30 also preceeded by discomfort but stool was pure bright red blood and large volume, still no dizziness just feeling of fatigue. Had a couple of smaller stools with clots of blood.  Seen by PMD at office at 320 PM.  While there she passed another large stool of blood, clots of blood and small bit of stool.  [facility]  Has had 2 smaller episodes of passing clots late last night.  Belly is a bit sore like she did too many sit ups. No nausea, she is hungry.   CT scan shows colonic diverticulosis, no colitis but does have extensive coronary and aortic vascular calcification.  Incidentally has nodular liver, gallstones and a mass in RLL.   Takes no NSAIds. No blood thinners. No hx PUD.  No prior GI bleeding.  Never had EGD or colonoscopy.  Fells well today but still fatigued Hgb is stable and 12.2 currently.  LFTs and coags normal.       Past Medical History  Diagnosis Date  . Hypertension   . Neuromuscular disorder   . Migraines     "when I was very  young; before 1968"  . Rheumatoid arthritis(714.0)   . Transverse myelitis 1979    w/transient inability to walk    Past Surgical History  Procedure Laterality Date  . Vaginal hysterectomy  1982  . Orif ankle fracture  09/2008    right  . Breast lumpectomy  1970    right  . Fracture surgery      plates and screws in right ankle  . Tubal ligation      Prior to Admission medications   Medication Sig Start Date End Date Taking? Authorizing Provider  folic acid (FOLVITE) 800 MCG tablet Take 800 mcg by mouth daily.   Yes Historical Provider, MD  HYDROcodone-acetaminophen (NORCO) 5-325 MG per tablet Take 1 tablet by mouth every 6 (six) hours as needed for moderate pain.    Yes Historical Provider, MD  hydroxychloroquine (PLAQUENIL) 200 MG tablet Take 200 mg by mouth 2 (two) times daily.   Yes Historical Provider, MD  metFORMIN (GLUCOPHAGE) 500 MG tablet Take 500 mg by mouth every evening.   Yes Historical Provider, MD  methotrexate (RHEUMATREX)  2.5 MG tablet Take 12.5 mg by mouth once a week.   Yes Historical Provider, MD  metoprolol (LOPRESSOR) 50 MG tablet Take 50 mg by mouth 2 (two) times daily.   Yes Historical Provider, MD  Multiple Vitamin (MULITIVITAMIN WITH MINERALS) TABS Take 1 tablet by mouth daily.   Yes Historical Provider, MD  traMADol (ULTRAM) 50 MG tablet Take 50 mg by mouth every 6 (six) hours as needed for moderate pain.    Yes Historical Provider, MD  Vitamin D, Ergocalciferol, (DRISDOL) 50000 UNITS CAPS capsule Take 50,000 Units by mouth every 7 (seven) days.   Yes Historical Provider, MD    Scheduled Meds: . folic acid  800 mcg Oral Daily  . hydroxychloroquine  200 mg Oral BID  . insulin aspart  0-9 Units Subcutaneous TID WC  . metoprolol  50 mg Oral BID  . multivitamin with minerals  1 tablet Oral Daily  . pantoprazole (PROTONIX) IV  40 mg Intravenous QHS  . Vitamin D (Ergocalciferol)  50,000 Units Oral Q7 days   Infusions: . sodium chloride 125 mL/hr at  05/10/13 0618   PRN Meds: HYDROcodone-acetaminophen, traMADol   Allergies as of 05/09/2013 - Review Complete 05/09/2013  Allergen Reaction Noted  . Demerol Other (See Comments) 10/01/2011  . Peanut-containing drug products  05/09/2013    Family History Mom is 70 as of 04/2013 and has diverticulosis and hx diverticulitis Brother had heart disease and Hepatitis B with cirrhosis  History   Social History  . Marital Status: Married    Spouse Name: N/A    Number of Children: N/A  . Years of Education: N/A   Occupational History  . 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 Smoking status: Former Smoker -- 0.50 packs/day for 35 years    Types: Cigarettes    Quit date: 01/14/2011  . Smokeless tobacco: Never Used  . Alcohol Use: 0.6 oz/week    1 Glasses of wine per month  . Drug Use: Yes    Special: Marijuana     Comment: "marijuana in the 1960's"  . Sexual Activity: No    REVIEW OF SYSTEMS: Constitutional:  Intentional weight loss of 20 # after gaining 35 # when she quit smoking a few years back.  Began dieting in 2013 when got dx of DM ENT:  No nose bleeds Pulm:  No cough or dyspnea.  No PND CV:  No chest pain, no palps, no extremity edema GU:  No hemturia or dysuria GI:  Per HPI Heme:  No hx anemia.    Transfusions:  none Neuro:  Rare headaches, no numbness or tingling Derm:  No rash or sores Endocrine:  No sweats, no increased thirst. Sugars run in low 100s.  Immunization:  Never gets flu shot due to hx of transverse myelitis many years ago.  Thinks she may have received vaccination for Hep B in 1980s [place] Travel:  None.    PHYSICAL EXAM: Vital signs in last 24 hours: Filed Vitals:   05/10/13 0538  BP: 119/62  Pulse: 64  Temp: 98.3 F (36.8 C)  Resp: 19   Wt Readings from Last 3 Encounters:  05/10/13 80.377 kg (177 lb 3.2 oz)  02/18/12 86.183 kg (190 lb)  01/07/12 84.596 kg (186 lb 8 oz)    General: pleasant older WF who looks well Head:  No swelling or asymmetry  Eyes:  No icterus, no conj pallor Ears:  Not  HOH  Nose:  No discharge or congestion Mouth:  Clear, moist, good dental repair Neck:  No JVD or TMG Lungs:  Clear bil.  Unlabored breathing Heart: RRR.  No MRG Abdomen:  Soft, ND, no mass, no HAS, no bruits, no prominent pulses.  Slightly tender on left abdomen ("feels like I did too many sit ups").   Rectal: deferred   Musc/Skeltl: no joint deformity or contracture Extremities:  Non-pitting pedal swelling, slight.  Neurologic:  Oriented x 3, excellent recall of details Skin:  No rash, no telangectasia, no sores Tattoos:  None Nodes:  No cervical adenopathy   Psych:  Pleasant, bright affect, garrulous.   Intake/Output from previous day: 11/25 0701 - 11/26 0700 In: 929.2 [P.O.:50; I.V.:879.2] Out: 0  Intake/Output this shift:    LAB RESULTS:  Recent Labs  05/09/13 0036 05/09/13 1653 05/10/13 0555  WBC 5.2 6.0 4.3  HGB 12.4 13.9 12.2  HCT 37.3 40.8 35.7*  PLT 114* 135* 126*   BMET Lab Results  Component Value Date   NA 141 05/10/2013   NA 140 05/09/2013   NA 137 12/15/2011   K 4.0 05/10/2013   K 4.1 05/09/2013   K 4.4 12/15/2011   CL 107 05/10/2013   CL 102 05/09/2013   CL 98 12/15/2011   CO2 25 05/10/2013   CO2 24 05/09/2013   CO2 29 12/15/2011   GLUCOSE 126* 05/10/2013   GLUCOSE 132* 05/09/2013   GLUCOSE 222* 12/15/2011   BUN 9 05/10/2013   BUN 12 05/09/2013   BUN 11 12/15/2011   CREATININE 0.81 05/10/2013   CREATININE 0.80 05/09/2013   CREATININE 0.9 12/15/2011   CALCIUM 8.4 05/10/2013   CALCIUM 9.0 05/09/2013   CALCIUM 9.6 12/15/2011   LFT  Recent Labs  05/09/13 1653 05/10/13 0555  PROT 6.9 5.8*  ALBUMIN 3.7 3.1*  AST 20 16  ALT 25 20  ALKPHOS 74 64  BILITOT 0.2* 0.3   PT/INR Lab Results  Component Value Date   INR 0.92 05/09/2013   INR 1.0 09/22/2008     RADIOLOGY STUDIES: Ct Abdomen Pelvis W Contrast 05/09/2013      COMPARISON:  11/28/2009 abdominal ultrasound.  FINDINGS: Lower Chest: 4 mm right lower lobe lung nodule on image 11. Heart size upper normal, without pericardial or pleural effusion. Right coronary artery atherosclerosis.  Abdomen/Pelvis: Mild cirrhosis, as evidenced by an irregular hepatic contour and prominence of the caudate and lateral segment left liver lobes. No focal liver lesion. Normal spleen, , right adrenal gland. Mild pancreatic atrophy. Cholelithiasis without acute cholecystitis or biliary ductal dilatation. A suprarenal heterogeneous 2.9 cm density on image 20 represents a gastric diverticulum, when correlated with sagittal re-formatted image 88. Normal left adrenal gland.  Upper pole left renal scarring.  Normal right kidney.  Aortic and branch vessel atherosclerosis. Non aneurysmal dilatation of the infrarenal aorta at 2.4 cm.  No retroperitoneal or retrocrural adenopathy. Extensive colonic diverticulosis. Redundant sigmoid. Normal terminal ileum and appendix. Normal small bowel without abdominal ascites. No pelvic adenopathy. Normal urinary bladder. Hysterectomy. No adnexal mass or significant free fluid.  Bones/Musculoskeletal:  Mild osteopenia.  IMPRESSION: 1.  No acute process or explanation for rectal bleeding. 2. Mild cirrhosis. 3. Cholelithiasis. 4. Age advanced coronary artery atherosclerosis. Recommend assessment of coronary risk factors and consideration of medical therapy. 5. Gastric diverticulum. 6. Aortic advanced atherosclerosis with non aneurysmal infrarenal dilatation. 7. 4 mm right lower lobe lung nodule. Given risk factors for bronchogenic carcinoma, follow-up chest CT at 1 year  is recommended. This recommendation follows the consensus statement: "Guidelines for Management of Small Pulmonary Nodules Detected on CT Scans: A Statement from the Fleischner Society" as published in Radiology 2005; 237:395-400. Available online at: [email or web address].[email or web address].    Electronically Signed   By: Jeronimo Greaves M.D.   On: 05/09/2013 20:51    ENDOSCOPIC STUDIES: None ever  IMPRESSION:   *  Acute hematochezia.  Rule out diverticular bleed a extensive diverticulosis seen on CT scan.   *  Cirrhosis by CT scan, hx of fatty liver. 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   *  Lung nodule RLL by CT scan.   *  CAD and PVD.  Extensive, advanced for age cardiovascular and aortic atherosclerosis by CT scan.  This raises possibility of ischemic colitis but there is no colitis by CT scan and bleeding is way more than is typical with ischemic colitis.     PLAN:     *  Needs colonoscopy, timing to be determined. Ok to have clears *  Stopped the [employer], this is LGIB   Jennye Moccasin  05/10/2013, 9:07 AM Pager: [phone]      Attending physician's note   I have taken a history, examined the patient and reviewed the chart. I agree with the Advanced Practitioner's note, impression and recommendations. Acute LGI bleed which has now stopped. Suspect diverticular bleed, R/O other causes. Cirrhosis will need further outpatient evaluation. Will schedule colonoscopy for tomorrow.  Meryl Dare, MD [facility]

## 2013-05-10 NOTE — Progress Notes (Signed)
Utilization review completed. Anchor Dwan, RN, BSN. 

## 2013-05-10 NOTE — Progress Notes (Signed)
Subjective: Had a good night. No BM at all. No abd pain. No breathing trouble No nausea. Hungry    Objective: Vital signs in last 24 hours: Temp:  [98.3 F (36.8 C)-99.3 F (37.4 C)] 98.3 F (36.8 C) (11/26 0538) Pulse Rate:  [64-88] 64 (11/26 0538) Resp:  [16-19] 19 (11/26 0538) BP: (109-156)/(52-88) 119/62 mmHg (11/26 0538) SpO2:  [95 %-100 %] 95 % (11/26 0538) Weight:  [80.015 kg (176 lb 6.4 oz)-80.377 kg (177 lb 3.2 oz)] 80.377 kg (177 lb 3.2 oz) (11/26 0538)  Intake/Output from previous day: 11/25 0701 - 11/26 0700 In: 929.2 [P.O.:50; I.V.:879.2] Out: 0  Intake/Output this shift:    Up walking in room in no distress. Lungs clear ht regular, abd nontender today No edema  Lab Results   Recent Labs  05/09/13 1653 05/10/13 0555  WBC 6.0 4.3  RBC 4.47 3.94  HGB 13.9 12.2  HCT 40.8 35.7*  MCV 91.3 90.6  MCH 31.1 31.0  RDW 13.6 13.5  PLT 135* 126*    Recent Labs  05/09/13 1653 05/10/13 0555  NA 140 141  K 4.1 4.0  CL 102 107  CO2 24 25  GLUCOSE 132* 126*  BUN 12 9  CREATININE 0.80 0.81  CALCIUM 9.0 8.4    Studies/Results: Ct Abdomen Pelvis W Contrast  05/09/2013   CLINICAL DATA:  Rectal bleeding x4 today. Weakness and lower abdominal pain. Smoker.  EXAM: CT ABDOMEN AND PELVIS WITH CONTRAST  TECHNIQUE: Multidetector CT imaging of the abdomen and pelvis was performed using the standard protocol following bolus administration of intravenous contrast.  CONTRAST:  80mL OMNIPAQUE IOHEXOL 300 MG/ML  SOLN  COMPARISON:  11/28/2009 abdominal ultrasound.  FINDINGS: Lower Chest: 4 mm right lower lobe lung nodule on image 11. Heart size upper normal, without pericardial or pleural effusion. Right coronary artery atherosclerosis.  Abdomen/Pelvis: Mild cirrhosis, as evidenced by an irregular hepatic contour and prominence of the caudate and lateral segment left liver lobes. No focal liver lesion. Normal spleen, , right adrenal gland. Mild pancreatic atrophy. Cholelithiasis  without acute cholecystitis or biliary ductal dilatation. A suprarenal heterogeneous 2.9 cm density on image 20 represents a gastric diverticulum, when correlated with sagittal re-formatted image 88. Normal left adrenal gland.  Upper pole left renal scarring.  Normal right kidney.  Aortic and branch vessel atherosclerosis. Non aneurysmal dilatation of the infrarenal aorta at 2.4 cm.  No retroperitoneal or retrocrural adenopathy. Extensive colonic diverticulosis. Redundant sigmoid. Normal terminal ileum and appendix. Normal small bowel without abdominal ascites. No pelvic adenopathy. Normal urinary bladder. Hysterectomy. No adnexal mass or significant free fluid.  Bones/Musculoskeletal:  Mild osteopenia.  IMPRESSION: 1.  No acute process or explanation for rectal bleeding. 2. Mild cirrhosis. 3. Cholelithiasis. 4. Age advanced coronary artery atherosclerosis. Recommend assessment of coronary risk factors and consideration of medical therapy. 5. Gastric diverticulum. 6. Aortic advanced atherosclerosis with non aneurysmal infrarenal dilatation. 7. 4 mm right lower lobe lung nodule. Given risk factors for bronchogenic carcinoma, follow-up chest CT at 1 year is recommended. This recommendation follows the consensus statement: "Guidelines for Management of Small Pulmonary Nodules Detected on CT Scans: A Statement from the Fleischner Society" as published in Radiology 2005; 237:395-400. Available online at: [email or web address].[email or web address].   Electronically Signed   By: Jeronimo Greaves M.D.   On: 05/09/2013 20:51    Scheduled Meds: . folic acid  800 mcg Oral Daily  . hydroxychloroquine  200 mg Oral BID  . insulin aspart  0-9 Units Subcutaneous TID WC  .  metoprolol  50 mg Oral BID  . multivitamin with minerals  1 tablet Oral Daily  . pantoprazole (PROTONIX) IV  40 mg Intravenous QHS  . Vitamin D (Ergocalciferol)  50,000 Units Oral Q7 days   Continuous Infusions: . sodium chloride 125 mL/hr at  05/10/13 0618   PRN Meds:HYDROcodone-acetaminophen, traMADol  Assessment/Plan:  Lower GI bleed: has done well overnight. No more bleeding evident. BP OK. Hgb 12.2 Active Problems:  Rheumatoid arthritis(714.0): We'll hold her medications the present time.  Type II or unspecified type diabetes mellitus without mention of complication, not stated as uncontrolled: doing OK  Essential hypertension, benign doing OK CIRRHOSIS: pt notes NAFLD by [place] in past CHOLELITHIASIS: This appears to be an incidental finding. She has no pain related to this.  CORONARY calcifications: We need to decide if this needs to be worked up as an outpatient. She has atherosclerosis both her cardiac and aortic areas.  PULMONARY NODULE: This will need followup per the recommendations above.  OSTEOPENIA: Likely due to rheumatoid arthritis and medications for this. Outpatient management is indicated    LOS: 1 day   Ramces Shomaker ALAN 05/10/2013, 8:37 AM

## 2013-05-11 ENCOUNTER — Encounter (HOSPITAL_COMMUNITY)
Admission: EM | Disposition: A | Discharge status: Home / Self Care | Payer: Self-pay | Source: Home / Self Care | Attending: Internal Medicine

## 2013-05-11 ENCOUNTER — Encounter (HOSPITAL_COMMUNITY): Payer: Self-pay | Admitting: Gastroenterology

## 2013-05-11 DIAGNOSIS — K7460 Unspecified cirrhosis of liver: Secondary | ICD-10-CM

## 2013-05-11 DIAGNOSIS — R911 Solitary pulmonary nodule: Secondary | ICD-10-CM

## 2013-05-11 DIAGNOSIS — M8580 Other specified disorders of bone density and structure, unspecified site: Secondary | ICD-10-CM

## 2013-05-11 DIAGNOSIS — I700 Atherosclerosis of aorta: Secondary | ICD-10-CM

## 2013-05-11 DIAGNOSIS — D696 Thrombocytopenia, unspecified: Secondary | ICD-10-CM

## 2013-05-11 DIAGNOSIS — D126 Benign neoplasm of colon, unspecified: Secondary | ICD-10-CM

## 2013-05-11 HISTORY — PX: COLONOSCOPY: SHX5424

## 2013-05-11 LAB — BASIC METABOLIC PANEL
BUN: 4 mg/dL — ABNORMAL LOW (ref 6–23)
CO2: 20 mEq/L (ref 19–32)
Calcium: 8.1 mg/dL — ABNORMAL LOW (ref 8.4–10.5)
Chloride: 111 mEq/L (ref 96–112)
GFR calc non Af Amer: 89 mL/min — ABNORMAL LOW (ref >90)
Glucose, Bld: 135 mg/dL — ABNORMAL HIGH (ref 70–99)
Sodium: 142 mEq/L (ref 135–145)

## 2013-05-11 LAB — CBC
HCT: 37.3 % (ref 36.0–46.0)
Hemoglobin: 12.5 g/dL (ref 12.0–15.0)
MCH: 31.1 pg (ref 26.0–34.0)
MCHC: 33.5 g/dL (ref 30.0–36.0)
MCV: 92.8 fL (ref 78.0–100.0)
Platelets: 105 10*3/uL — ABNORMAL LOW (ref 150–400)
RBC: 4.02 MIL/uL (ref 3.87–5.11)

## 2013-05-11 SURGERY — COLONOSCOPY
Anesthesia: Moderate Sedation

## 2013-05-11 MED ORDER — DIPHENHYDRAMINE HCL 50 MG/ML IJ SOLN
INTRAMUSCULAR | Status: DC | PRN
  Administered 2013-05-11: 25 mg via INTRAVENOUS

## 2013-05-11 MED ORDER — FENTANYL CITRATE 0.05 MG/ML IJ SOLN
INTRAMUSCULAR | Status: AC
  Filled 2013-05-11: qty 2

## 2013-05-11 MED ORDER — FENTANYL CITRATE 0.05 MG/ML IJ SOLN
INTRAMUSCULAR | Status: DC | PRN
  Administered 2013-05-11 (×4): 25 ug via INTRAVENOUS

## 2013-05-11 MED ORDER — MIDAZOLAM HCL 5 MG/5ML IJ SOLN
INTRAMUSCULAR | Status: DC | PRN
  Administered 2013-05-11: 2 mg via INTRAVENOUS
  Administered 2013-05-11: 1 mg via INTRAVENOUS
  Administered 2013-05-11: 2 mg via INTRAVENOUS

## 2013-05-11 MED ORDER — MIDAZOLAM HCL 5 MG/ML IJ SOLN
INTRAMUSCULAR | Status: AC
  Filled 2013-05-11: qty 2

## 2013-05-11 MED ORDER — DIPHENHYDRAMINE HCL 50 MG/ML IJ SOLN
INTRAMUSCULAR | Status: AC
  Filled 2013-05-11: qty 1

## 2013-05-11 NOTE — Discharge Summary (Signed)
Physician Discharge Summary  DISCHARGE SUMMARY   Patient ID: Hayley Hogan MR#: [id number] DOB/AGE: October 10, 1945 67 y.o.   Attending Physician:Jennine Peddy M  Patient's WUJ:WJXBJYNW,GNFAOZ G, MD  Consults:Treatment Team:  Hart Carwin, MD Meryl Dare, MD**  Admit date: 05/09/2013 Discharge date: 05/11/2013  Discharge Diagnoses:  Principal Problem:   Lower GI bleed Active Problems:   Hypertension   Rheumatoid arthritis(714.0)   Type II or unspecified type diabetes mellitus without mention of complication, not stated as uncontrolled   Essential hypertension, benign   Acute lower GI bleeding   Lung nodule   Osteopenia   Atherosclerosis of aorta   Cirrhosis of liver without mention of alcohol   Thrombocytopenia, unspecified   Patient Active Problem List   Diagnosis Date Noted  . Lung nodule 05/11/2013  . Osteopenia 05/11/2013  . Atherosclerosis of aorta 05/11/2013  . Cirrhosis of liver without mention of alcohol 05/11/2013  . Thrombocytopenia, unspecified 05/11/2013  . Lower GI bleed 05/09/2013  . Type II or unspecified type diabetes mellitus without mention of complication, not stated as uncontrolled 05/09/2013  . Essential hypertension, benign 05/09/2013  . Acute lower GI bleeding 05/09/2013  . Chronic cholecystitis 02/18/2012  . Rheumatoid arthritis(714.0) 01/07/2012  . Headache 10/01/2011    Class: Acute  . Nausea and vomiting 10/01/2011    Class: Acute  . Hypertension 10/01/2011    Class: Acute   Past Medical History  Diagnosis Date  . Hypertension   . Neuromuscular disorder   . Migraines     "when I was very young; before 1968"  . Rheumatoid arthritis(714.0)   . Transverse myelitis 1979    w/transient inability to walk  . DM type 2 (diabetes mellitus, type 2)   . Fatty liver disease, nonalcoholic     Discharged Condition: stable   Discharge Medications:   Medication List         folic acid 800 MCG tablet  Commonly known as:  FOLVITE   Take 800 mcg by mouth daily.     HYDROcodone-acetaminophen 5-325 MG per tablet  Commonly known as:  NORCO/VICODIN  Take 1 tablet by mouth every 6 (six) hours as needed for moderate pain.     hydroxychloroquine 200 MG tablet  Commonly known as:  PLAQUENIL  Take 200 mg by mouth 2 (two) times daily.     metFORMIN 500 MG tablet  Commonly known as:  GLUCOPHAGE  Take 500 mg by mouth every evening.     methotrexate 2.5 MG tablet  Commonly known as:  RHEUMATREX  Take 12.5 mg by mouth once a week.     metoprolol 50 MG tablet  Commonly known as:  LOPRESSOR  Take 50 mg by mouth 2 (two) times daily.     multivitamin with minerals Tabs tablet  Take 1 tablet by mouth daily.     traMADol 50 MG tablet  Commonly known as:  ULTRAM  Take 50 mg by mouth every 6 (six) hours as needed for moderate pain.     Vitamin D (Ergocalciferol) 50000 UNITS Caps capsule  Commonly known as:  DRISDOL  Take 50,000 Units by mouth every 7 (seven) days.        Hospital Procedures: Ct Abdomen Pelvis W Contrast  05/09/2013   CLINICAL DATA:  Rectal bleeding x4 today. Weakness and lower abdominal pain. Smoker.  EXAM: CT ABDOMEN AND PELVIS WITH CONTRAST  TECHNIQUE: Multidetector CT imaging of the abdomen and pelvis was performed using the standard protocol following bolus administration of intravenous contrast.  CONTRAST:  80mL OMNIPAQUE IOHEXOL 300 MG/ML  SOLN  COMPARISON:  11/28/2009 abdominal ultrasound.  FINDINGS: Lower Chest: 4 mm right lower lobe lung nodule on image 11. Heart size upper normal, without pericardial or pleural effusion. Right coronary artery atherosclerosis.  Abdomen/Pelvis: Mild cirrhosis, as evidenced by an irregular hepatic contour and prominence of the caudate and lateral segment left liver lobes. No focal liver lesion. Normal spleen, , right adrenal gland. Mild pancreatic atrophy. Cholelithiasis without acute cholecystitis or biliary ductal dilatation. A suprarenal heterogeneous 2.9 cm density  on image 20 represents a gastric diverticulum, when correlated with sagittal re-formatted image 88. Normal left adrenal gland.  Upper pole left renal scarring.  Normal right kidney.  Aortic and branch vessel atherosclerosis. Non aneurysmal dilatation of the infrarenal aorta at 2.4 cm.  No retroperitoneal or retrocrural adenopathy. Extensive colonic diverticulosis. Redundant sigmoid. Normal terminal ileum and appendix. Normal small bowel without abdominal ascites. No pelvic adenopathy. Normal urinary bladder. Hysterectomy. No adnexal mass or significant free fluid.  Bones/Musculoskeletal:  Mild osteopenia.  IMPRESSION: 1.  No acute process or explanation for rectal bleeding. 2. Mild cirrhosis. 3. Cholelithiasis. 4. Age advanced coronary artery atherosclerosis. Recommend assessment of coronary risk factors and consideration of medical therapy. 5. Gastric diverticulum. 6. Aortic advanced atherosclerosis with non aneurysmal infrarenal dilatation. 7. 4 mm right lower lobe lung nodule. Given risk factors for bronchogenic carcinoma, follow-up chest CT at 1 year is recommended. This recommendation follows the consensus statement: "Guidelines for Management of Small Pulmonary Nodules Detected on CT Scans: A Statement from the Fleischner Society" as published in Radiology 2005; 237:395-400. Available online at: [email or web address].[email or web address].   Electronically Signed   By: Jeronimo Greaves M.D.   On: 05/09/2013 20:51    History of Present Illness: 67 year old white female who presented to med attention 05/09/13 for GI bleeding. She was well until morning of admission.  When she went to the bathroom she passed a bloody stool with bright red blood with no stool mixed. She had a cramping pain in the left lower quadrant associated with this. She had no nausea or vomiting. She did not have weakness palpitations or sweating during this episode. She's had roughly 5 episodes all of which were very similar. In  the ED she had a bit of tenderness in the left lower quadrant which she had been blaming on a hernia in this area. She's never seen any blood in her stool prior. She's never had any GI investigations. She's had an intentional weight loss of 20 pounds in the past year. She notes no reflux. She has no chronic nausea or vomiting. Her blood sugars had been doing well. She's had no neuropathic complaints. She notes no headache. She's not been weak. She's had no chest pain or shortness of breath. No fever family has had similar difficulties. Her mother is alive at 44.  Not orthostatics in the office.  She was HD stable.   Hospital Course: She was admitted c hematochezia. She remained HD stable. Hbgs stayed @ 12. No abd pain. No breathing troubles Seen by GI Dr Russella Dar - Acute hematochezia. Rule out diverticular bleed as extensive diverticulosis seen on CT scan. Low likliehood of ischemic colitis.  Other Dif Dx considered.  B/c it was felt not to be an UGIB - PPI was D/ced.  She was prepped and colonoscopy was done 05/11/13. While doing the prep - old dark blood came out initially, then cleared.  No nausea.  We discussed her MTX while in house.   *  Cirrhosis by CT scan, hx of fatty liver. Transaminases normal. NAFLD by [place] in past. Outpt GI Eval.  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 This appears to be an incidental finding. She has no pain related to this.  * Lung nodule RLL by CT scan. This will need followup per Radiology/CT report recs   * CAD and PVD. Extensive, advanced for age cardiovascular and aortic atherosclerosis by CT scan. RF Reduce and leave to Dr Eloise Harman for Outpt w/up. She has atherosclerosis both her cardiac and aortic areas.  * Rheumatoid arthritis: Resume all meds on D/C.  * Type II diabetes mellitus: CBGs fine. OK. Metformin.  * Essential hypertension, benign BP fine.  She remained HD stable. * OSTEOPENIA: Likely due to rheumatoid arthritis and medications  for this. Outpatient management is indicated * Thrombocytopenia - 105-135 - follow up as outpt.  ?MTX induced.  Colon showed: 1. Two sessile polyps measuring 7-8 mm in the ascending colon; polypectomy performed using snare cautery  2. Semi-pedunculated polyp measuring 1 cm in the sigmoid colon;  polypectomy performed using snare cautery  3. Sessile polyp measuring 5 mm in the rectum; polypectomy  performed with a cold snare  4. Moderate diverticulosis was noted in the ascending colon,  transverse colon, descending colon, and sigmoid colon  5. Small internal hemorrhodis  RECOMMENDATIONS:  1. Await pathology results  2. Hold aspirin, aspirin products, and anti-inflammatory medication  for 2 weeks. (she is not on any) 3. Repeat colonoscopy in 3 years if polyps adenomatous; otherwise  10 years  4. High fiber diet with liberal fluid intake.  5. Presumed diverticular bleed that has stopped. Can advance diet and plan for discharge.  I will d/c her @ 2 pm if everything stable and she has eaten and moved about.   Day of Discharge Exam BP 140/69  Pulse 61  Temp(Src) 98.1 F (36.7 C) (Oral)  Resp 14  Ht 5' 7.5" (1.715 m)  Wt 80.468 kg (177 lb 6.4 oz)  BMI 27.36 kg/m2  SpO2 98%  Physical Exam: See PN   Discharge Labs:  Recent Labs  05/10/13 0555 05/11/13 0550  NA 141 142  K 4.0 3.8  CL 107 111  CO2 25 20  GLUCOSE 126* 135*  BUN 9 4*  CREATININE 0.81 0.69  CALCIUM 8.4 8.1*    Recent Labs  05/09/13 1653 05/10/13 0555  AST 20 16  ALT 25 20  ALKPHOS 74 64  BILITOT 0.2* 0.3  PROT 6.9 5.8*  ALBUMIN 3.7 3.1*    Recent Labs  05/09/13 1653 05/10/13 0555 05/11/13 0550  WBC 6.0 4.3 4.0  NEUTROABS 3.0  --   --   HGB 13.9 12.2 12.5  HCT 40.8 35.7* 37.3  MCV 91.3 90.6 92.8  PLT 135* 126* 105*   No results found for this basename: CKTOTAL, CKMB, CKMBINDEX, TROPONINI,  in the last 72 hours No results found for this basename: TSH, T4TOTAL, FREET3, T3FREE, THYROIDAB,  in  the last 72 hours No results found for this basename: VITAMINB12, FOLATE, FERRITIN, TIBC, IRON, RETICCTPCT,  in the last 72 hours Lab Results  Component Value Date   INR 0.92 05/09/2013   INR 1.0 09/22/2008       Discharge instructions:  01-Home or Self Care Follow-up Information   Follow up with Garlan Fillers, MD In 10 days.   Specialty:  Internal Medicine   Contact information:   [address] [facility] [place]  [address] [phone]       Call Judie Petit T. Russella Dar, MD.   Specialty:  Gastroenterology   Contact information:   [address] [place] [address] [phone]        Disposition:  Home  Follow-up Appts: Follow-up with Dr. Eloise Harman at [facility] in 1 week.  Call for appointment.  Condition on Discharge: stable  Tests Needing Follow-up: Labs.   Time spent in discharge (includes decision making & examination of pt): 30 min  Signed: Tierrah Anastos M 05/11/2013, 8:52 AM

## 2013-05-11 NOTE — Progress Notes (Signed)
Went over exit care notes 3 page info sheets on Low fiber diets and what's allowed. Pt and her husband reading material than discussed.

## 2013-05-11 NOTE — Progress Notes (Signed)
Pt discharged per w/c with all belongings. Has exit care notes on GI bleed and D/C  Instructions. Pt accompanied by nurse tech and husband. Ambulated in hallway prior to d/c and tolerated well. Ate 100 % of meal and tolerating po fluids well.

## 2013-05-11 NOTE — Progress Notes (Signed)
Went over discharge instructions with pt. Pt aware what meds are due next and when to call MD about any more GI bleeding.

## 2013-05-11 NOTE — Interval H&P Note (Signed)
History and Physical Interval Note:  05/11/2013 8:54 AM  Hayley Hogan  has presented today for surgery, with the diagnosis of hematochezia  The various methods of treatment have been discussed with the patient and family. After consideration of risks, benefits and other options for treatment, the patient has consented to  Procedure(s): COLONOSCOPY (N/A) as a surgical intervention .  The patient's history has been reviewed, patient examined, no change in status, stable for surgery.  I have reviewed the patient's chart and labs.  Questions were answered to the patient's satisfaction.     Venita Lick. Russella Dar MD Clementeen Graham

## 2013-05-11 NOTE — Op Note (Signed)
[facility] [address] [place], [address]   COLONOSCOPY PROCEDURE REPORT PATIENT: Hayley Hogan, Hayley Hogan  MR#: [id number] BIRTHDATE: January 10, 1946 , 67  yrs. old GENDER: Female ENDOSCOPIST: Meryl Dare, MD, [facility] REFERRED NW:GNFAOZH Evlyn Kanner, M.D. PROCEDURE DATE:  05/11/2013 PROCEDURE:   Colonoscopy with snare polypectomy First Screening Colonoscopy - Avg.  risk and is 50 yrs.  old or older - No.  Prior Negative Screening - Now for repeat screening. N/A  History of Adenoma - Now for follow-up colonoscopy & has been > or = to 3 yrs.  N/A  Polyps Removed Today? Yes. ASA CLASS:   Class II INDICATIONS:hematochezia. MEDICATIONS: These medications were titrated to patient response per physician's verbal order, Fentanyl 100 mcg IV, Versed 5 mg IV, and Diphenhydramine (Benadryl) 25 mg IV DESCRIPTION OF PROCEDURE:   After the risks benefits and alternatives of the procedure were thoroughly explained, informed consent was obtained.  A digital rectal exam revealed no abnormalities of the rectum.   The Pentax Ped Colon [id number] endoscope was introduced through the anus and advanced to the cecum, which was identified by both the appendix and ileocecal valve. No adverse events experienced.   Limited by a tortuous and redundant colon.   The quality of the prep was adequate, using MoviPrep  The instrument was then slowly withdrawn as the colon was fully examined.  COLON FINDINGS: Two sessile polyps measuring 7-8 mm in size were found in the ascending colon.  A polypectomy was performed using snare cautery. The resection was complete and the polyp tissue was completely retrieved.   A semi-pedunculated polyp measuring 1 cm in size was found in the sigmoid colon.  A polypectomy was performed using snare cautery.  The resection was complete and the polyp tissue was completely retrieved. A sessile polyp measuring 5 mm in size was found in the rectum.  A polypectomy was performed  with a cold snare.  The resection was complete and the polyp tissue was completely retrieved.   Moderate diverticulosis was noted in the ascending colon, transverse colon, descending colon, and sigmoid colon.  The colon was otherwise normal.  There was no diverticulosis, inflammation, polyps or cancers unless previously stated.  Retroflexed views revealed small internal hemorrhoids. The time to cecum=6 minutes 00 seconds.  Withdrawal time=21 minutes 00 seconds.  The scope was withdrawn and the procedure completed. COMPLICATIONS: There were no complications.  ENDOSCOPIC IMPRESSION: 1.   Two sessile polyps measuring 7-8 mm in the ascending colon; polypectomy performed using snare cautery 2.  Semi-pedunculated polyp measuring 1 cm in the sigmoid colon; polypectomy performed using snare cautery 3.   Sessile polyp measuring 5 mm in the rectum; polypectomy performed with a cold snare 4.   Moderate diverticulosis was noted in the ascending colon, transverse colon, descending colon, and sigmoid colon 5.   Small internal hemorrhodis RECOMMENDATIONS: 1.  Await pathology results 2.  Hold aspirin, aspirin products, and anti-inflammatory medication for 2 weeks. 3.  Repeat colonoscopy in 3 years if polyps adenomatous; otherwise 10 years 4.  High fiber diet with liberal fluid intake. 5.  Presumed diverticular bleed that has stopped. Can advance diet and plan for discharge.  eSigned:  Meryl Dare, MD, [facility] 05/11/2013 9:47 AM     PATIENT NAME:  Hayley Hogan, Hayley Hogan MR#: [id number]

## 2013-05-11 NOTE — Progress Notes (Signed)
Subjective: No abd pain. No breathing trouble S/P prep and old dark blood came out initially No nausea.  We discussed her RA and meds. Seen in ENDO suite.   Objective: Vital signs in last 24 hours: Temp:  [97.8 F (36.6 C)-98.1 F (36.7 C)] 98.1 F (36.7 C) (11/27 0506) Pulse Rate:  [61-68] 61 (11/27 0506) Resp:  [17-20] 18 (11/27 0730) BP: (113-149)/(63-75) 140/69 mmHg (11/27 0506) SpO2:  [95 %-99 %] 98 % (11/27 0506) Weight:  [80.468 kg (177 lb 6.4 oz)] 80.468 kg (177 lb 6.4 oz) (11/27 0506)  Intake/Output from previous day: 11/26 0701 - 11/27 0700 In: 3100 [P.O.:1600; I.V.:1500] Out: 802 [Urine:800; Stool:2] Intake/Output this shift:   PE OP- dry Car - Reg Lungs clear abd nontender, soft + LE edema  Lab Results   Recent Labs  05/10/13 0555 05/11/13 0550  WBC 4.3 4.0  RBC 3.94 4.02  HGB 12.2 12.5  HCT 35.7* 37.3  MCV 90.6 92.8  MCH 31.0 31.1  RDW 13.5 13.8  PLT 126* 105*    Recent Labs  05/10/13 0555 05/11/13 0550  NA 141 142  K 4.0 3.8  CL 107 111  CO2 25 20  GLUCOSE 126* 135*  BUN 9 4*  CREATININE 0.81 0.69  CALCIUM 8.4 8.1*    Studies/Results: Ct Abdomen Pelvis W Contrast  05/09/2013   CLINICAL DATA:  Rectal bleeding x4 today. Weakness and lower abdominal pain. Smoker.  EXAM: CT ABDOMEN AND PELVIS WITH CONTRAST  TECHNIQUE: Multidetector CT imaging of the abdomen and pelvis was performed using the standard protocol following bolus administration of intravenous contrast.  CONTRAST:  80mL OMNIPAQUE IOHEXOL 300 MG/ML  SOLN  COMPARISON:  11/28/2009 abdominal ultrasound.  FINDINGS: Lower Chest: 4 mm right lower lobe lung nodule on image 11. Heart size upper normal, without pericardial or pleural effusion. Right coronary artery atherosclerosis.  Abdomen/Pelvis: Mild cirrhosis, as evidenced by an irregular hepatic contour and prominence of the caudate and lateral segment left liver lobes. No focal liver lesion. Normal spleen, , right adrenal gland. Mild  pancreatic atrophy. Cholelithiasis without acute cholecystitis or biliary ductal dilatation. A suprarenal heterogeneous 2.9 cm density on image 20 represents a gastric diverticulum, when correlated with sagittal re-formatted image 88. Normal left adrenal gland.  Upper pole left renal scarring.  Normal right kidney.  Aortic and branch vessel atherosclerosis. Non aneurysmal dilatation of the infrarenal aorta at 2.4 cm.  No retroperitoneal or retrocrural adenopathy. Extensive colonic diverticulosis. Redundant sigmoid. Normal terminal ileum and appendix. Normal small bowel without abdominal ascites. No pelvic adenopathy. Normal urinary bladder. Hysterectomy. No adnexal mass or significant free fluid.  Bones/Musculoskeletal:  Mild osteopenia.  IMPRESSION: 1.  No acute process or explanation for rectal bleeding. 2. Mild cirrhosis. 3. Cholelithiasis. 4. Age advanced coronary artery atherosclerosis. Recommend assessment of coronary risk factors and consideration of medical therapy. 5. Gastric diverticulum. 6. Aortic advanced atherosclerosis with non aneurysmal infrarenal dilatation. 7. 4 mm right lower lobe lung nodule. Given risk factors for bronchogenic carcinoma, follow-up chest CT at 1 year is recommended. This recommendation follows the consensus statement: "Guidelines for Management of Small Pulmonary Nodules Detected on CT Scans: A Statement from the Fleischner Society" as published in Radiology 2005; 237:395-400. Available online at: [email or web address].cz.   Electronically Signed   By: Jeronimo Greaves M.D.   On: 05/09/2013 20:51    Scheduled Meds: . [facility] HOLD] folic acid  800 mcg Oral Daily  . [facility] HOLD] hydroxychloroquine  200 mg Oral BID  . [facility] HOLD]  insulin aspart  0-9 Units Subcutaneous TID WC  . [facility] HOLD] metoprolol  50 mg Oral BID  . [[facility] HOLD] multivitamin with minerals  1 tablet Oral Daily  . [facility] HOLD] peg 3350 powder  0.5 kit Oral Custom  . [facility] HOLD] Vitamin D  (Ergocalciferol)  50,000 Units Oral Q7 days   Continuous Infusions: . sodium chloride 125 mL/hr at 05/11/13 0121   PRN Meds:[[facility] HOLD] HYDROcodone-acetaminophen, [MAR HOLD] traMADol  Assessment/Plan: Hematochezia/Lower GI bleed:Remains HD Stable. No more bleeding evident. Old Dark Blood evident with prep.  For Colon Now.  Rheumatoid arthritis: We'll hold her medications the present time but restart on D/C.  Type II diabetes mellitus: OK  Essential hypertension, benign BP fine CIRRHOSIS: pt notes NAFLD by [place] in past. Outpt GI Eval. CHOLELITHIASIS: This appears to be an incidental finding. She has no pain related to this.  CORONARY calcifications: RF Reduce and leave to Dr Eloise Harman for Outpt w/up. She has atherosclerosis both her cardiac and aortic areas.  PULMONARY NODULE: This will need followup per CT recs OSTEOPENIA: Likely due to rheumatoid arthritis and medications for this. Outpatient management is indicated   My Cell is [phone]  Will decide after scope if she goes home today or tomorrow.   LOS: 2 days   Brittanyann Wittner M 05/11/2013, 8:30 AM

## 2013-05-11 NOTE — Plan of Care (Signed)
Problem: Consults Goal: GI Bleeding Patient Education See Patient Education Module for education specifics.  Outcome: Completed/Met Date Met:  05/11/13 Exit care notes on GI bleed

## 2013-05-11 NOTE — H&P (View-Only) (Signed)
                                                                           [facility] Consult: 9:07 AM 05/10/2013  LOS: 1 day    Referring Provider: Dr South  Primary Care Physician:  PATERSON,DANIEL G, MD Primary Gastroenterologist:  None     Reason for Consultation:  Hematochezia.     HPI: Hayley Hogan is a 67 y.o. female.  Hx rheumatoid arthritis treated with MTX. DM2 controlled with oral agents and diet.  MTX has caused rise in LFTs in past but LFTs normalized so long as she stays on no more than 12.5 mg per week.  Past ultrasounds show fatty liver.  Never has GI issues, never had an EGD or colonoscopy.  Yesterday around 930 had some mid abdominal crampy discomfort and then passed a loose, "messy" stool that leached some blood into commode.  She felt fatigued but not dixxy or nauseated. The next BM about 10 30 also preceeded by discomfort but stool was pure bright red blood and large volume, still no dizziness just feeling of fatigue. Had a couple of smaller stools with clots of blood.  Seen by PMD at office at 320 PM.  While there she passed another large stool of blood, clots of blood and small bit of stool.  [facility]  Has had 2 smaller episodes of passing clots late last night.  Belly is a bit sore like she did too many sit ups. No nausea, she is hungry.   CT scan shows colonic diverticulosis, no colitis but does have extensive coronary and aortic vascular calcification.  Incidentally has nodular liver, gallstones and a mass in RLL.   Takes no NSAIds. No blood thinners. No hx PUD.  No prior GI bleeding.  Never had EGD or colonoscopy.  Fells well today but still fatigued Hgb is stable and 12.2 currently.  LFTs and coags normal.       Past Medical History  Diagnosis Date  . Hypertension   . Neuromuscular disorder   . Migraines     "when I was very  young; before 1968"  . Rheumatoid arthritis(714.0)   . Transverse myelitis 1979    w/transient inability to walk    Past Surgical History  Procedure Laterality Date  . Vaginal hysterectomy  1982  . Orif ankle fracture  09/2008    right  . Breast lumpectomy  1970    right  . Fracture surgery      plates and screws in right ankle  . Tubal ligation      Prior to Admission medications   Medication Sig Start Date End Date Taking? Authorizing Provider  folic acid (FOLVITE) 800 MCG tablet Take 800 mcg by mouth daily.   Yes Historical Provider, MD  HYDROcodone-acetaminophen (NORCO) 5-325 MG per tablet Take 1 tablet by mouth every 6 (six) hours as needed for moderate pain.    Yes Historical Provider, MD  hydroxychloroquine (PLAQUENIL) 200 MG tablet Take 200 mg by mouth 2 (two) times daily.   Yes Historical Provider, MD  metFORMIN (GLUCOPHAGE) 500 MG tablet Take 500 mg by mouth every evening.   Yes Historical Provider, MD  methotrexate (RHEUMATREX)   2.5 MG tablet Take 12.5 mg by mouth once a week.   Yes Historical Provider, MD  metoprolol (LOPRESSOR) 50 MG tablet Take 50 mg by mouth 2 (two) times daily.   Yes Historical Provider, MD  Multiple Vitamin (MULITIVITAMIN WITH MINERALS) TABS Take 1 tablet by mouth daily.   Yes Historical Provider, MD  traMADol (ULTRAM) 50 MG tablet Take 50 mg by mouth every 6 (six) hours as needed for moderate pain.    Yes Historical Provider, MD  Vitamin D, Ergocalciferol, (DRISDOL) 50000 UNITS CAPS capsule Take 50,000 Units by mouth every 7 (seven) days.   Yes Historical Provider, MD    Scheduled Meds: . folic acid  800 mcg Oral Daily  . hydroxychloroquine  200 mg Oral BID  . insulin aspart  0-9 Units Subcutaneous TID WC  . metoprolol  50 mg Oral BID  . multivitamin with minerals  1 tablet Oral Daily  . pantoprazole (PROTONIX) IV  40 mg Intravenous QHS  . Vitamin D (Ergocalciferol)  50,000 Units Oral Q7 days   Infusions: . sodium chloride 125 mL/hr at  05/10/13 0618   PRN Meds: HYDROcodone-acetaminophen, traMADol   Allergies as of 05/09/2013 - Review Complete 05/09/2013  Allergen Reaction Noted  . Demerol Other (See Comments) 10/01/2011  . Peanut-containing drug products  05/09/2013    Family History Mom is 97 as of 04/2013 and has diverticulosis and hx diverticulitis Brother had heart disease and Hepatitis B with cirrhosis  History   Social History  . Marital Status: Married    Spouse Name: N/A    Number of Children: N/A  . Years of Education: N/A   Occupational History  . Works as bus driver for handicapped students   Social History Main Topics  . Smoking status: Former Smoker -- 0.50 packs/day for 35 years    Types: Cigarettes    Quit date: 01/14/2011  . Smokeless tobacco: Never Used  . Alcohol Use: 0.6 oz/week    1 Glasses of wine per month  . Drug Use: Yes    Special: Marijuana     Comment: "marijuana in the 1960's"  . Sexual Activity: No    REVIEW OF SYSTEMS: Constitutional:  Intentional weight loss of 20 # after gaining 35 # when she quit smoking a few years back.  Began dieting in 2013 when got dx of DM ENT:  No nose bleeds Pulm:  No cough or dyspnea.  No PND CV:  No chest pain, no palps, no extremity edema GU:  No hemturia or dysuria GI:  Per HPI Heme:  No hx anemia.    Transfusions:  none Neuro:  Rare headaches, no numbness or tingling Derm:  No rash or sores Endocrine:  No sweats, no increased thirst. Sugars run in low 100s.  Immunization:  Never gets flu shot due to hx of transverse myelitis many years ago.  Thinks she may have received vaccination for Hep B in 1980s [place] Travel:  None.    PHYSICAL EXAM: Vital signs in last 24 hours: Filed Vitals:   05/10/13 0538  BP: 119/62  Pulse: 64  Temp: 98.3 F (36.8 C)  Resp: 19   Wt Readings from Last 3 Encounters:  05/10/13 80.377 kg (177 lb 3.2 oz)  02/18/12 86.183 kg (190 lb)  01/07/12 84.596 kg (186 lb 8 oz)    General: pleasant older WF who looks well Head:  No swelling or asymmetry  Eyes:  No icterus, no conj pallor Ears:  Not   HOH  Nose:  No discharge or congestion Mouth:  Clear, moist, good dental repair Neck:  No JVD or TMG Lungs:  Clear bil.  Unlabored breathing Heart: RRR.  No MRG Abdomen:  Soft, ND, no mass, no HAS, no bruits, no prominent pulses.  Slightly tender on left abdomen ("feels like I did too many sit ups").   Rectal: deferred   Musc/Skeltl: no joint deformity or contracture Extremities:  Non-pitting pedal swelling, slight.  Neurologic:  Oriented x 3, excellent recall of details Skin:  No rash, no telangectasia, no sores Tattoos:  None Nodes:  No cervical adenopathy   Psych:  Pleasant, bright affect, garrulous.   Intake/Output from previous day: 11/25 0701 - 11/26 0700 In: 929.2 [P.O.:50; I.V.:879.2] Out: 0  Intake/Output this shift:    LAB RESULTS:  Recent Labs  05/09/13 0036 05/09/13 1653 05/10/13 0555  WBC 5.2 6.0 4.3  HGB 12.4 13.9 12.2  HCT 37.3 40.8 35.7*  PLT 114* 135* 126*   BMET Lab Results  Component Value Date   NA 141 05/10/2013   NA 140 05/09/2013   NA 137 12/15/2011   K 4.0 05/10/2013   K 4.1 05/09/2013   K 4.4 12/15/2011   CL 107 05/10/2013   CL 102 05/09/2013   CL 98 12/15/2011   CO2 25 05/10/2013   CO2 24 05/09/2013   CO2 29 12/15/2011   GLUCOSE 126* 05/10/2013   GLUCOSE 132* 05/09/2013   GLUCOSE 222* 12/15/2011   BUN 9 05/10/2013   BUN 12 05/09/2013   BUN 11 12/15/2011   CREATININE 0.81 05/10/2013   CREATININE 0.80 05/09/2013   CREATININE 0.9 12/15/2011   CALCIUM 8.4 05/10/2013   CALCIUM 9.0 05/09/2013   CALCIUM 9.6 12/15/2011   LFT  Recent Labs  05/09/13 1653 05/10/13 0555  PROT 6.9 5.8*  ALBUMIN 3.7 3.1*  AST 20 16  ALT 25 20  ALKPHOS 74 64  BILITOT 0.2* 0.3   PT/INR Lab Results  Component Value Date   INR 0.92 05/09/2013   INR 1.0 09/22/2008     RADIOLOGY STUDIES: Ct Abdomen Pelvis W Contrast 05/09/2013      COMPARISON:  11/28/2009 abdominal ultrasound.  FINDINGS: Lower Chest: 4 mm right lower lobe lung nodule on image 11. Heart size upper normal, without pericardial or pleural effusion. Right coronary artery atherosclerosis.  Abdomen/Pelvis: Mild cirrhosis, as evidenced by an irregular hepatic contour and prominence of the caudate and lateral segment left liver lobes. No focal liver lesion. Normal spleen, , right adrenal gland. Mild pancreatic atrophy. Cholelithiasis without acute cholecystitis or biliary ductal dilatation. A suprarenal heterogeneous 2.9 cm density on image 20 represents a gastric diverticulum, when correlated with sagittal re-formatted image 88. Normal left adrenal gland.  Upper pole left renal scarring.  Normal right kidney.  Aortic and branch vessel atherosclerosis. Non aneurysmal dilatation of the infrarenal aorta at 2.4 cm.  No retroperitoneal or retrocrural adenopathy. Extensive colonic diverticulosis. Redundant sigmoid. Normal terminal ileum and appendix. Normal small bowel without abdominal ascites. No pelvic adenopathy. Normal urinary bladder. Hysterectomy. No adnexal mass or significant free fluid.  Bones/Musculoskeletal:  Mild osteopenia.  IMPRESSION: 1.  No acute process or explanation for rectal bleeding. 2. Mild cirrhosis. 3. Cholelithiasis. 4. Age advanced coronary artery atherosclerosis. Recommend assessment of coronary risk factors and consideration of medical therapy. 5. Gastric diverticulum. 6. Aortic advanced atherosclerosis with non aneurysmal infrarenal dilatation. 7. 4 mm right lower lobe lung nodule. Given risk factors for bronchogenic carcinoma, follow-up chest CT at 1 year   is recommended. This recommendation follows the consensus statement: "Guidelines for Management of Small Pulmonary Nodules Detected on CT Scans: A Statement from the Fleischner Society" as published in Radiology 2005; 237:395-400. Available online at: http://www.med.[employer].edu/rad/res/Fleischner-nodule.htm.    Electronically Signed   By: Kyle  Talbot M.D.   On: 05/09/2013 20:51    ENDOSCOPIC STUDIES: None ever  IMPRESSION:   *  Acute hematochezia.  Rule out diverticular bleed a extensive diverticulosis seen on CT scan.   *  Cirrhosis by CT scan, hx of fatty liver. 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   *  Lung nodule RLL by CT scan.   *  CAD and PVD.  Extensive, advanced for age cardiovascular and aortic atherosclerosis by CT scan.  This raises possibility of ischemic colitis but there is no colitis by CT scan and bleeding is way more than is typical with ischemic colitis.     PLAN:     *  Needs colonoscopy, timing to be determined. Ok to have clears *  Stopped the Protonix, this is LGIB   Sarah Gribbin  05/10/2013, 9:07 AM Pager: [phone]      Attending physician's note   I have taken a history, examined the patient and reviewed the chart. I agree with the Advanced Practitioner's note, impression and recommendations. Acute LGI bleed which has now stopped. Suspect diverticular bleed, R/O other causes. Cirrhosis will need further outpatient evaluation. Will schedule colonoscopy for tomorrow.  Emilia Kayes T Ramey Ketcherside, MD FACG 

## 2013-05-14 NOTE — ED Provider Notes (Signed)
History/physical exam/procedure(s) were performed by non-physician practitioner and as supervising physician I was immediately available for consultation/collaboration. I have reviewed all notes and am in agreement with care and plan.   Lissette Schenk S Koda Defrank, MD 05/14/13 0802 

## 2013-05-15 ENCOUNTER — Encounter (HOSPITAL_COMMUNITY): Payer: Self-pay | Admitting: Gastroenterology

## 2013-05-15 ENCOUNTER — Encounter: Payer: Self-pay | Admitting: Gastroenterology

## 2013-12-26 ENCOUNTER — Other Ambulatory Visit (HOSPITAL_COMMUNITY): Payer: Self-pay | Admitting: Rheumatology

## 2013-12-26 DIAGNOSIS — R7989 Other specified abnormal findings of blood chemistry: Secondary | ICD-10-CM

## 2013-12-26 DIAGNOSIS — R945 Abnormal results of liver function studies: Principal | ICD-10-CM

## 2013-12-27 ENCOUNTER — Other Ambulatory Visit (HOSPITAL_COMMUNITY): Payer: Self-pay | Admitting: Rheumatology

## 2013-12-27 DIAGNOSIS — R945 Abnormal results of liver function studies: Principal | ICD-10-CM

## 2013-12-27 DIAGNOSIS — R7989 Other specified abnormal findings of blood chemistry: Secondary | ICD-10-CM

## 2014-01-02 ENCOUNTER — Encounter (HOSPITAL_COMMUNITY): Payer: Self-pay | Admitting: Pharmacy Technician

## 2014-01-02 ENCOUNTER — Other Ambulatory Visit: Payer: Self-pay | Admitting: Radiology

## 2014-01-03 ENCOUNTER — Other Ambulatory Visit: Payer: Self-pay | Admitting: Radiology

## 2014-01-05 ENCOUNTER — Encounter (HOSPITAL_COMMUNITY): Payer: Self-pay

## 2014-01-05 ENCOUNTER — Ambulatory Visit (HOSPITAL_COMMUNITY)
Admission: RE | Admit: 2014-01-05 | Discharge: 2014-01-05 | Disposition: A | Discharge status: Home / Self Care | Payer: BC Managed Care – PPO | Source: Ambulatory Visit | Attending: Rheumatology | Admitting: Rheumatology

## 2014-01-05 DIAGNOSIS — K7689 Other specified diseases of liver: Secondary | ICD-10-CM | POA: Insufficient documentation

## 2014-01-05 DIAGNOSIS — M069 Rheumatoid arthritis, unspecified: Secondary | ICD-10-CM | POA: Insufficient documentation

## 2014-01-05 DIAGNOSIS — Z79899 Other long term (current) drug therapy: Secondary | ICD-10-CM | POA: Insufficient documentation

## 2014-01-05 DIAGNOSIS — R7989 Other specified abnormal findings of blood chemistry: Secondary | ICD-10-CM

## 2014-01-05 DIAGNOSIS — I10 Essential (primary) hypertension: Secondary | ICD-10-CM | POA: Insufficient documentation

## 2014-01-05 DIAGNOSIS — E119 Type 2 diabetes mellitus without complications: Secondary | ICD-10-CM | POA: Insufficient documentation

## 2014-01-05 DIAGNOSIS — R945 Abnormal results of liver function studies: Secondary | ICD-10-CM

## 2014-01-05 LAB — GLUCOSE, CAPILLARY: Glucose-Capillary: 135 mg/dL — ABNORMAL HIGH (ref 70–99)

## 2014-01-05 LAB — CBC
HEMATOCRIT: 46.9 % — AB (ref 36.0–46.0)
Hemoglobin: 15.6 g/dL — ABNORMAL HIGH (ref 12.0–15.0)
MCH: 31 pg (ref 26.0–34.0)
MCHC: 33.3 g/dL (ref 30.0–36.0)
MCV: 93.2 fL (ref 78.0–100.0)
Platelets: 123 10*3/uL — ABNORMAL LOW (ref 150–400)
RBC: 5.03 MIL/uL (ref 3.87–5.11)
RDW: 13.7 % (ref 11.5–15.5)
WBC: 3.9 10*3/uL — AB (ref 4.0–10.5)

## 2014-01-05 LAB — PROTIME-INR
INR: 0.89 (ref 0.00–1.49)
PROTHROMBIN TIME: 12 s (ref 11.6–15.2)

## 2014-01-05 LAB — APTT: APTT: 28 s (ref 24–37)

## 2014-01-05 MED ORDER — HYDROCODONE-ACETAMINOPHEN 5-325 MG PO TABS: 1.0000 | ORAL_TABLET | ORAL | Status: DC | PRN

## 2014-01-05 MED ORDER — SODIUM CHLORIDE 0.9 % IV SOLN: INTRAVENOUS | Status: DC

## 2014-01-05 MED ORDER — FENTANYL CITRATE 0.05 MG/ML IJ SOLN
INTRAMUSCULAR | Status: AC | PRN
  Administered 2014-01-05: 50 ug via INTRAVENOUS

## 2014-01-05 MED ORDER — MIDAZOLAM HCL 2 MG/2ML IJ SOLN
INTRAMUSCULAR | Status: AC
  Filled 2014-01-05: qty 4

## 2014-01-05 MED ORDER — FENTANYL CITRATE 0.05 MG/ML IJ SOLN
INTRAMUSCULAR | Status: AC
  Filled 2014-01-05: qty 4

## 2014-01-05 MED ORDER — MIDAZOLAM HCL 2 MG/2ML IJ SOLN
INTRAMUSCULAR | Status: AC | PRN
  Administered 2014-01-05 (×2): 1 mg via INTRAVENOUS

## 2014-01-05 MED ORDER — GELATIN ABSORBABLE 12-7 MM EX MISC
CUTANEOUS | Status: AC
  Filled 2014-01-05: qty 1

## 2014-01-05 NOTE — H&P (Signed)
Chief Complaint: "I am here for a liver biopsy." Referring Physician: Dr. Charlestine Night HPI: Hayley Hogan is an 68 y.o. female who is scheduled today for a random liver biopsy secondary to elevated LFT's and on chronic methotrexate for RA. She denies any abdominal pain, chest pain, shortness of breath or palpitations. She denies any active signs of bleeding or excessive bruising. She denies any recent fever or chills. The patient denies any history of sleep apnea or chronic oxygen use. She has previously tolerated sedation without complications.   Past Medical History:  Past Medical History  Diagnosis Date  . Hypertension   . Neuromuscular disorder   . Migraines     "when I was very young; before 1968"  . Rheumatoid arthritis(714.0)   . Transverse myelitis 1979    w/transient inability to walk  . DM type 2 (diabetes mellitus, type 2)   . Fatty liver disease, nonalcoholic     Past Surgical History:  Past Surgical History  Procedure Laterality Date  . Vaginal hysterectomy  1982  . Orif ankle fracture  09/2008    right  . Breast lumpectomy  1970    right  . Fracture surgery      plates and screws in right ankle  . Tubal ligation    . Colonoscopy N/A 05/11/2013    Procedure: COLONOSCOPY;  Surgeon: Ladene Artist, MD;  Location: [facility] ENDOSCOPY;  Service: Endoscopy;  Laterality: N/A;    Family History: No family history on file.  Social History:  reports that she quit smoking about 2 years ago. Her smoking use included Cigarettes. She has a 17.5 pack-year smoking history. She has never used smokeless tobacco. She reports that she drinks about .6 ounces of alcohol per week. She reports that she uses illicit drugs (Marijuana).  Allergies:  Allergies  Allergen Reactions  . Demerol Other (See Comments)    vomiting  . Peanut-Containing Drug Products     Mouth swelling    Medications:   Medication List    ASK your doctor about these medications       folic acid 161 MCG tablet   Commonly known as:  FOLVITE  Take 800 mcg by mouth daily.     hydroxychloroquine 200 MG tablet  Commonly known as:  PLAQUENIL  Take 200 mg by mouth 2 (two) times daily.     metFORMIN 500 MG tablet  Commonly known as:  GLUCOPHAGE  Take 500 mg by mouth every evening.     methotrexate 2.5 MG tablet  Commonly known as:  RHEUMATREX  Take 12.5 mg by mouth once a week. Wednesday     metoprolol 100 MG tablet  Commonly known as:  LOPRESSOR  Take 100 mg by mouth 2 (two) times daily.     multivitamin with minerals Tabs tablet  Take 1 tablet by mouth daily.     traMADol 50 MG tablet  Commonly known as:  ULTRAM  Take 50 mg by mouth every 6 (six) hours as needed for moderate pain.     Vitamin D (Ergocalciferol) 50000 UNITS Caps capsule  Commonly known as:  DRISDOL  Take 50,000 Units by mouth every 7 (seven) days. Wednesday       Please HPI for pertinent positives, otherwise complete 10 system ROS negative.  Physical Exam: BP 167/83  Pulse 59  Temp(Src) 98.2 F (36.8 C) (Oral)  Resp 11  Ht 5' 7.5" (1.715 m)  Wt 175 lb (79.379 kg)  BMI 26.99 kg/m2  SpO2 100% Body mass index  is 26.99 kg/(m^2).  General Appearance:  Alert, cooperative, no distress  Head:  Normocephalic, without obvious abnormality, atraumatic  Neck: Supple, symmetrical, trachea midline  Lungs:   Clear to auscultation bilaterally, no w/r/r, respirations unlabored without use of accessory muscles.  Chest Wall:  No tenderness or deformity  Heart:  Regular rate and rhythm, S1, S2 normal, no murmur, rub or gallop.  Abdomen:   Soft, non-tender, non distended, (+) BS  Extremities: Extremities normal, atraumatic, no cyanosis or edema  Neurologic: Normal affect, no gross deficits.   Results for orders placed during the hospital encounter of 01/05/14 (from the past 48 hour(s))  APTT     Status: None   Collection Time    01/05/14  8:06 AM      Result Value Ref Range   aPTT 28  24 - 37 seconds  CBC     Status:  Abnormal   Collection Time    01/05/14  8:06 AM      Result Value Ref Range   WBC 3.9 (*) 4.0 - 10.5 K/uL   RBC 5.03  3.87 - 5.11 MIL/uL   Hemoglobin 15.6 (*) 12.0 - 15.0 g/dL   HCT 46.9 (*) 36.0 - 46.0 %   MCV 93.2  78.0 - 100.0 fL   MCH 31.0  26.0 - 34.0 pg   MCHC 33.3  30.0 - 36.0 g/dL   RDW 13.7  11.5 - 15.5 %   Platelets 123 (*) 150 - 400 K/uL  PROTIME-INR     Status: None   Collection Time    01/05/14  8:06 AM      Result Value Ref Range   Prothrombin Time 12.0  11.6 - 15.2 seconds   INR 0.89  0.00 - 1.49   No results found.  Assessment/Plan Elevated LFT's  RA, on methotrexate Scheduled today for US guided random liver biopsy with moderate sedation. Patient has been NPO, no blood thinners taken, labs reviewed. Risks and Benefits discussed with the patient. All of the patient's questions were answered, patient is agreeable to proceed. Consent signed and in chart.   Tsosie Billing D PA-C 01/05/2014, 11:02 AM

## 2014-01-05 NOTE — Sedation Documentation (Signed)
Patient is resting comfortably. 

## 2014-01-05 NOTE — Procedures (Signed)
Interventional Radiology Procedure Note  Procedure: US guided core biopsy Complications: None immediate Recommendations: - Bedrest x 3 hrs  Signed,  Criselda Peaches, MD [facility]

## 2014-01-05 NOTE — Discharge Instructions (Signed)
Liver Biopsy, Care After °These instructions give you information on caring for yourself after your procedure. Your doctor may also give you more specific instructions. Call your doctor if you have any problems or questions after your procedure. °HOME CARE °· Rest at home for 1-2 days or as told by your doctor. °· Have someone stay with you for at least 24 hours. °· Do not do these things in the first 24 hours: °¨ Drive. °¨ Use machinery. °¨ Take care of other people. °¨ Sign legal documents. °¨ Take a bath or shower. °· There are many different ways to close and cover a cut (incision). For example, a cut can be closed with stitches, skin glue, or adhesive strips. Follow your doctor's instructions on: °¨ Taking care of your cut. °¨ Changing and removing your bandage (dressing). °¨ Removing whatever was used to close your cut. °· Do not drink alcohol in the first week. °· Do not lift more than 5 pounds or play contact sports for the first 2 weeks. °· Take medicines only as told by your doctor. For 1 week, do not take medicine that has aspirin in it or medicines like ibuprofen. °· Get your test results. °GET HELP IF: °· A cut bleeds and leaves more than just a small spot of blood. °· A cut is red, puffs up (swells), or hurts more than before. °· Fluid or something else comes from a cut. °· A cut smells bad. °· You have a fever or chills. °GET HELP RIGHT AWAY IF: °· You have swelling, bloating, or pain in your belly (abdomen). °· You get dizzy or faint. °· You have a rash. °· You feel sick to your stomach (nauseous) or throw up (vomit). °· You have trouble breathing, feel short of breath, or feel faint. °· Your chest hurts. °· You have problems talking or seeing. °· You have trouble balancing or moving your arms or legs. °Document Released: 03/10/2008 Document Revised: 10/16/2013 Document Reviewed: 07/28/2013 °ExitCare® Patient Information ©2015 [employer]. This information is not intended to replace advice given to  you by your health care provider. Make sure you discuss any questions you have with your health care provider. ° °

## 2014-03-12 ENCOUNTER — Institutional Professional Consult (permissible substitution): Payer: BC Managed Care – PPO | Admitting: Internal Medicine

## 2014-03-15 ENCOUNTER — Ambulatory Visit (INDEPENDENT_AMBULATORY_CARE_PROVIDER_SITE_OTHER): Payer: BC Managed Care – PPO | Admitting: Internal Medicine

## 2014-03-15 ENCOUNTER — Encounter: Payer: Self-pay | Admitting: Internal Medicine

## 2014-03-15 VITALS — BP 155/93 | HR 55 | Ht 67.75 in | Wt 176.0 lb

## 2014-03-15 DIAGNOSIS — E785 Hyperlipidemia, unspecified: Secondary | ICD-10-CM

## 2014-03-15 DIAGNOSIS — F17200 Nicotine dependence, unspecified, uncomplicated: Secondary | ICD-10-CM

## 2014-03-15 DIAGNOSIS — M069 Rheumatoid arthritis, unspecified: Secondary | ICD-10-CM

## 2014-03-15 DIAGNOSIS — I2510 Atherosclerotic heart disease of native coronary artery without angina pectoris: Secondary | ICD-10-CM

## 2014-03-15 DIAGNOSIS — R931 Abnormal findings on diagnostic imaging of heart and coronary circulation: Secondary | ICD-10-CM

## 2014-03-15 DIAGNOSIS — R072 Precordial pain: Secondary | ICD-10-CM

## 2014-03-15 DIAGNOSIS — I10 Essential (primary) hypertension: Secondary | ICD-10-CM

## 2014-03-15 DIAGNOSIS — Z720 Tobacco use: Secondary | ICD-10-CM

## 2014-03-15 DIAGNOSIS — E1129 Type 2 diabetes mellitus with other diabetic kidney complication: Secondary | ICD-10-CM

## 2014-03-15 DIAGNOSIS — R079 Chest pain, unspecified: Secondary | ICD-10-CM

## 2014-03-15 DIAGNOSIS — E139 Other specified diabetes mellitus without complications: Secondary | ICD-10-CM

## 2014-03-15 MED ORDER — VARENICLINE TARTRATE 0.5 MG X 11 & 1 MG X 42 PO MISC: ORAL | Status: DC

## 2014-03-15 NOTE — Patient Instructions (Signed)
Your physician has requested that you have a lexiscan myoview. For further information please visit [email or web address]. Please follow instruction sheet, as given.  Your physician has recommended you make the following change in your medication...  1. START aspirin 81 mg once daily 2. START Chantix as directed  Your physician recommends that you schedule a follow-up appointment 4-6 weeks with Dr. Debara Pickett - after your test

## 2014-03-15 NOTE — Progress Notes (Signed)
OFFICE NOTE  Chief Complaint:  Chest pain  Primary Care Physician: Donnajean Lopes, MD  HPI:  Hayley Hogan is a pleasant 68 year old female he referred to me by Dr. Sharlett Iles. Her past history significant for well-controlled diabetes, rheumatoid arthritis, dyslipidemia and hypertension. She's also been a long-time smoker. Last year she underwent a CT scan in the setting of unexplained bleeding. This demonstrated coronary artery calcium, particularly in the right coronary artery which was significant. Recently she's been having some chest discomfort. She feels like she has a tightness in the center of her chest. The symptoms do not radiate and are sometimes associated with exertion and relieved by rest. Occasionally she feels like she needs to belch and that does relieve her symptoms. She's also reported some shortness of breath.  PMHx:  Past Medical History  Diagnosis Date  . Hypertension   . Neuromuscular disorder   . Migraines     "when I was very young; before 1968"  . Rheumatoid arthritis(714.0)   . Transverse myelitis 1979    w/transient inability to walk  . DM type 2 (diabetes mellitus, type 2)   . Fatty liver disease, nonalcoholic     Past Surgical History  Procedure Laterality Date  . Vaginal hysterectomy  1982  . Orif ankle fracture  09/2008    right  . Breast lumpectomy  1970    right  . Fracture surgery      plates and screws in right ankle  . Tubal ligation    . Colonoscopy N/A 05/11/2013    Procedure: COLONOSCOPY;  Surgeon: Ladene Artist, MD;  Location: [facility] END[facility]COPY;  Service: Endoscopy;  Laterality: N/A;    FAMHx:  Family History  Problem Relation Age of Onset  . Hypertension Mother   . Cancer Father   . Heart Problems Maternal Grandfather   . Cancer Brother   . Cancer Brother   . Other Brother     GSW  . Stroke Sister     x2  . Hypertension Sister     x2  . COPD Sister     SOCHx:   reports that she has been smoking Cigarettes.  She has  a 7 pack-year smoking history. She has never used smokeless tobacco. She reports that she drinks about .6 ounces of alcohol per week. She reports that she uses illicit drugs (Marijuana).  ALLERGIES:  Allergies  Allergen Reactions  . Demerol Other (See Comments)    vomiting  . Peanut-Containing Drug Products     Mouth swelling    ROS: A comprehensive review of systems was negative except for: Respiratory: positive for dyspnea on exertion Cardiovascular: positive for chest pressure/discomfort  HOME MEDS: Current Outpatient Prescriptions  Medication Sig Dispense Refill  . aspirin 81 MG tablet Take 81 mg by mouth daily.      . folic acid (FOLVITE) 875 MCG tablet Take 800 mcg by mouth daily.      . hydroxychloroquine (PLAQUENIL) 200 MG tablet Take 200 mg by mouth 2 (two) times daily.      . metFORMIN (GLUCOPHAGE) 500 MG tablet Take 500 mg by mouth every evening.      . methotrexate (RHEUMATREX) 2.5 MG tablet Take 12.5 mg by mouth once a week. Wednesday      . metoprolol (LOPRESSOR) 100 MG tablet Take 100 mg by mouth 2 (two) times daily.      . Multiple Vitamin (MULITIVITAMIN WITH MINERALS) TABS Take 1 tablet by mouth daily.      Marland Kitchen  pravastatin (PRAVACHOL) 20 MG tablet Take 1 tablet by mouth daily.      . traMADol (ULTRAM) 50 MG tablet Take 50 mg by mouth every 6 (six) hours as needed for moderate pain.       . Vitamin D, Ergocalciferol, (DRISDOL) 50000 UNITS CAPS capsule Take 50,000 Units by mouth every 7 (seven) days. Wednesday      . varenicline (CHANTIX PAK) 0.5 MG X 11 & 1 MG X 42 tablet Take one 0.5 mg tablet by mouth once daily for 3 days, then increase to one 0.5 mg tablet twice daily for 4 days, then increase to one 1 mg tablet twice daily.  53 tablet  0   No current facility-administered medications for this visit.    LABS/IMAGING: No results found for this or any previous visit (from the past 48 hour(s)). No results found.  VITALS: BP 155/93  Pulse 55  Ht 5' 7.75" (1.721  m)  Wt 176 lb (79.833 kg)  BMI 26.95 kg/m2  EXAM: General appearance: alert and no distress Neck: no carotid bruit and no JVD Lungs: clear to auscultation bilaterally Heart: regular rate and rhythm, S1, S2 normal, no murmur, click, rub or gallop Abdomen: soft, non-tender; bowel sounds normal; no masses,  no organomegaly Extremities: extremities normal, atraumatic, no cyanosis or edema Pulses: 2+ and symmetric Skin: multiple neurotic excoriations Neurologic: Grossly normal Psych: Anxious  EKG: Sinus bradycardia at 55, no ischemic changes  ASSESSMENT: 1. Chest pain and dyspnea 2. Elevated coronary artery calcium score 3. Rheumatoid arthritis 4. Diabetes type 2 5. Dyslipidemia 6. Hypertension 7. Tobacco abuse  PLAN: 1.   Hayley Hogan has numerous cardiac risk factors and has been having symptoms of chest discomfort and shortness of breath. This chest discomfort has some typical and atypical features however she does have known coronary artery disease based on her CT scan. I would recommend starting daily aspirin 81 mg daily which is indicated for coronary artery disease and type 2 diabetes. She may get some mild benefit with her rheumatoid arthritis. In addition I recommend a chemical nuclear stress test, which we will obtain shortly. Plan to see her back in the office in a few weeks to discuss the results of her stress test and make further recommendations at that time.  Thank you again for the kind referral.  Pixie Casino, MD, [facility] Attending Cardiologist [facility]  Hayley Hogan 03/15/2014, 12:49 PM

## 2014-03-27 ENCOUNTER — Telehealth (HOSPITAL_COMMUNITY): Payer: Self-pay

## 2014-03-27 NOTE — Telephone Encounter (Signed)
Encounter complete. 

## 2014-03-28 ENCOUNTER — Telehealth (HOSPITAL_COMMUNITY): Payer: Self-pay

## 2014-03-28 NOTE — Telephone Encounter (Signed)
Encounter complete. 

## 2014-03-29 ENCOUNTER — Ambulatory Visit (HOSPITAL_COMMUNITY)
Admission: RE | Admit: 2014-03-29 | Discharge: 2014-03-29 | Disposition: A | Discharge status: Home / Self Care | Payer: BC Managed Care – PPO | Source: Ambulatory Visit | Attending: Internal Medicine | Admitting: Internal Medicine

## 2014-03-29 DIAGNOSIS — E109 Type 1 diabetes mellitus without complications: Secondary | ICD-10-CM | POA: Insufficient documentation

## 2014-03-29 DIAGNOSIS — R079 Chest pain, unspecified: Secondary | ICD-10-CM | POA: Insufficient documentation

## 2014-03-29 DIAGNOSIS — Z794 Long term (current) use of insulin: Secondary | ICD-10-CM | POA: Insufficient documentation

## 2014-03-29 DIAGNOSIS — F17210 Nicotine dependence, cigarettes, uncomplicated: Secondary | ICD-10-CM | POA: Diagnosis not present

## 2014-03-29 DIAGNOSIS — E139 Other specified diabetes mellitus without complications: Secondary | ICD-10-CM

## 2014-03-29 DIAGNOSIS — F17200 Nicotine dependence, unspecified, uncomplicated: Secondary | ICD-10-CM

## 2014-03-29 DIAGNOSIS — R931 Abnormal findings on diagnostic imaging of heart and coronary circulation: Secondary | ICD-10-CM

## 2014-03-29 DIAGNOSIS — E785 Hyperlipidemia, unspecified: Secondary | ICD-10-CM | POA: Insufficient documentation

## 2014-03-29 DIAGNOSIS — I10 Essential (primary) hypertension: Secondary | ICD-10-CM

## 2014-03-29 DIAGNOSIS — R0602 Shortness of breath: Secondary | ICD-10-CM | POA: Diagnosis not present

## 2014-03-29 DIAGNOSIS — M069 Rheumatoid arthritis, unspecified: Secondary | ICD-10-CM

## 2014-03-29 MED ORDER — REGADENOSON 0.4 MG/5ML IV SOLN
0.4000 mg | Freq: Once | INTRAVENOUS | Status: AC
  Administered 2014-03-29: 0.4 mg via INTRAVENOUS

## 2014-03-29 MED ORDER — TECHNETIUM TC 99M SESTAMIBI GENERIC - CARDIOLITE
30.8000 | Freq: Once | INTRAVENOUS | Status: AC | PRN
  Administered 2014-03-29: 30.8 via INTRAVENOUS

## 2014-03-29 MED ORDER — TECHNETIUM TC 99M SESTAMIBI GENERIC - CARDIOLITE
10.7000 | Freq: Once | INTRAVENOUS | Status: AC | PRN
  Administered 2014-03-29: 11 via INTRAVENOUS

## 2014-03-29 MED ORDER — AMINOPHYLLINE 25 MG/ML IV SOLN
75.0000 mg | Freq: Once | INTRAVENOUS | Status: AC
  Administered 2014-03-29: 75 mg via INTRAVENOUS

## 2014-03-29 NOTE — Procedures (Addendum)
[place]  CARDIOVASCULAR IMAGING [address] [place] [address] [phone]  Cardiology Nuclear Med Study  Hayley Hogan is a 68 y.o. female     MRN : [id number]     DOB: Mar 19, 1946  Procedure Date: 03/29/2014  Nuclear Med Background Indication for Stress Test:  Evaluation for Ischemia History:  Elevated Coronary Calcium;No other cardiac or respiratory history reported;No priro NUC MPI for comparison. Cardiac Risk Factors: IDDM Type 1, Lipids, NIDDM and Smoker  Symptoms:  Chest Pain, DOE and SOB   Nuclear Pre-Procedure Caffeine/Decaff Intake:  9:00pm NPO After: 7:00am   IV Site: R Forearm  IV 0.9% NS with Angio Cath:  22g  Chest Size (in):  n/a IV Started by: Rolene Course, RN  Height: 5' 7.75" (1.721 m)  Cup Size: B  BMI:  Body mass index is 26.95 kg/(m^2). Weight:  176 lb (79.833 kg)   Tech Comments:  n/a    Nuclear Med Study 1 or 2 day study: 1 day  Stress Test Type:  [place] Provider:  Lyman Bishop, MD   Resting Radionuclide: Technetium 65m Sestamibi  Resting Radionuclide Dose: 10.7 mCi   Stress Radionuclide:  Technetium 77m Sestamibi  Stress Radionuclide Dose: 30.8 mCi           Stress Protocol Rest HR: 55 Stress HR: 67  Rest BP: 149/99 Stress BP: 131/103  Exercise Time (min): n/a METS: n/a   Predicted Max HR: 154 bpm % Max HR: 48.05 bpm Rate Pressure Product: 11766  Dose of Adenosine (mg):  n/a Dose of Lexiscan: 0.4 mg  Dose of Atropine (mg): n/a Dose of Dobutamine: n/a mcg/kg/min (at max HR)  Stress Test Technologist: Leane Para, CCT Nuclear Technologist: Imagene Riches, CNMT   Rest Procedure:  Myocardial perfusion imaging was performed at rest 45 minutes following the intravenous administration of Technetium 9m Sestamibi. Stress Procedure:  The patient received IV Lexiscan 0.4 mg over 15-seconds.  Technetium 66m Sestamibi injected at 30-seconds.  Patient experienced SOB and Headache and 75 mg  Aminophylline IV was administered. There were no significant changes with Lexiscan.  Quantitative spect images were obtained after a 45 minute delay.  Transient Ischemic Dilatation (Normal <1.22):  1.13  QGS EDV:  66 ml QGS ESV:  25 ml LV Ejection Fraction: 62%  Rest ECG: NSR - Normal EKG  Stress ECG: No significant change from baseline ECG  QPS Raw Data Images:  Normal; no motion artifact; normal heart/lung ratio. Stress Images:  Normal homogeneous uptake in all areas of the myocardium. Rest Images:  Normal homogeneous uptake in all areas of the myocardium. Subtraction (SDS):  Normal  Impression Exercise Capacity:  Lexiscan with no exercise. BP Response:  Normal blood pressure response. Clinical Symptoms:  No significant symptoms noted. ECG Impression:  No significant ECG changes with Lexiscan. Comparison with Prior Nuclear Study: No previous nuclear study performed  Overall Impression:  Normal stress nuclear study.  LV Wall Motion:  NL LV Function; NL Wall Motion; EF 62%  Pixie Casino, MD, [facility] Board Certified in Nuclear Cardiology Attending Cardiologist [place], [place]  03/29/2014 12:10 PM

## 2014-04-24 ENCOUNTER — Ambulatory Visit
Admission: RE | Admit: 2014-04-24 | Discharge: 2014-04-24 | Disposition: A | Discharge status: Home / Self Care | Payer: BC Managed Care – PPO | Source: Ambulatory Visit | Attending: Rheumatology | Admitting: Rheumatology

## 2014-04-24 ENCOUNTER — Other Ambulatory Visit: Payer: Self-pay | Admitting: Rheumatology

## 2014-04-24 DIAGNOSIS — R0602 Shortness of breath: Secondary | ICD-10-CM

## 2014-04-27 ENCOUNTER — Encounter: Payer: Self-pay | Admitting: Internal Medicine

## 2014-04-27 ENCOUNTER — Ambulatory Visit (INDEPENDENT_AMBULATORY_CARE_PROVIDER_SITE_OTHER): Payer: BC Managed Care – PPO | Admitting: Internal Medicine

## 2014-04-27 VITALS — BP 122/84 | HR 64 | Ht 67.5 in | Wt 176.7 lb

## 2014-04-27 DIAGNOSIS — R0789 Other chest pain: Secondary | ICD-10-CM

## 2014-04-27 DIAGNOSIS — I739 Peripheral vascular disease, unspecified: Secondary | ICD-10-CM | POA: Insufficient documentation

## 2014-04-27 DIAGNOSIS — I10 Essential (primary) hypertension: Secondary | ICD-10-CM

## 2014-04-27 DIAGNOSIS — I2510 Atherosclerotic heart disease of native coronary artery without angina pectoris: Secondary | ICD-10-CM

## 2014-04-27 DIAGNOSIS — R931 Abnormal findings on diagnostic imaging of heart and coronary circulation: Secondary | ICD-10-CM

## 2014-04-27 DIAGNOSIS — M069 Rheumatoid arthritis, unspecified: Secondary | ICD-10-CM

## 2014-04-27 NOTE — Patient Instructions (Signed)
Your physician wants you to follow-up in: 1 year with Dr. Hilty. You will receive a reminder letter in the mail two months in advance. If you don't receive a letter, please call our office to schedule the follow-up appointment.  

## 2014-04-27 NOTE — Progress Notes (Signed)
OFFICE NOTE  Chief Complaint:  Chest pain  Primary Care Physician: Donnajean Lopes, MD  HPI:  Hayley Hogan is a pleasant 68 year old female he referred to me by Dr. Sharlett Iles. Her past history significant for well-controlled diabetes, rheumatoid arthritis, dyslipidemia and hypertension. She's also been a long-time smoker. Last year she underwent a CT scan in the setting of unexplained bleeding. This demonstrated coronary artery calcium, particularly in the right coronary artery which was significant. Recently she's been having some chest discomfort. She feels like she has a tightness in the center of her chest. The symptoms do not radiate and are sometimes associated with exertion and relieved by rest. Occasionally she feels like she needs to belch and that does relieve her symptoms. She's also reported some shortness of breath.  Hayley Hogan returns today for follow-up. Her stress test was negative for ischemia. She does have coronary artery calcium as well as calcified atherosclerosis of the distal abdominal aorta and iliac bifurcation. There is disease distending into the iliofemoral arteries bilaterally. She denies any claudication symptoms. She reports since her last visit her chest pain symptoms have improved. She thinks is mainly just been related to stress and he continued workup she had to undergo after her abdominal CT demonstrated a number of abnormalities.  PMHx:  Past Medical History  Diagnosis Date  . Hypertension   . Neuromuscular disorder   . Migraines     "when I was very young; before 1968"  . Rheumatoid arthritis(714.0)   . Transverse myelitis 1979    w/transient inability to walk  . DM type 2 (diabetes mellitus, type 2)   . Fatty liver disease, nonalcoholic     Past Surgical History  Procedure Laterality Date  . Vaginal hysterectomy  1982  . Orif ankle fracture  09/2008    right  . Breast lumpectomy  1970    right  . Fracture surgery      plates and screws  in right ankle  . Tubal ligation    . Colonoscopy N/A 05/11/2013    Procedure: COLONOSCOPY;  Surgeon: Ladene Artist, MD;  Location: [facility] ENDOSCOPY;  Service: Endoscopy;  Laterality: N/A;    FAMHx:  Family History  Problem Relation Age of Onset  . Hypertension Mother   . Cancer Father   . Heart Problems Maternal Grandfather   . Cancer Brother   . Cancer Brother   . Other Brother     GSW  . Stroke Sister     x2  . Hypertension Sister     x2  . COPD Sister     SOCHx:   reports that she has been smoking Cigarettes.  She has a 7 pack-year smoking history. She has never used smokeless tobacco. She reports that she drinks about 0.6 oz of alcohol per week. She reports that she uses illicit drugs (Marijuana).  ALLERGIES:  Allergies  Allergen Reactions  . Demerol Other (See Comments)    vomiting  . Peanut-Containing Drug Products     Mouth swelling    ROS: A comprehensive review of systems was negative.  HOME MEDS: Current Outpatient Prescriptions  Medication Sig Dispense Refill  . aspirin 81 MG tablet Take 81 mg by mouth daily.    . folic acid (FOLVITE) 324 MCG tablet Take 800 mcg by mouth daily.    . hydroxychloroquine (PLAQUENIL) 200 MG tablet Take 200 mg by mouth 2 (two) times daily.    . metFORMIN (GLUCOPHAGE) 500 MG tablet Take 500 mg by mouth  every evening.    . methotrexate (RHEUMATREX) 2.5 MG tablet Take 12.5 mg by mouth once a week. Wednesday    . metoprolol (LOPRESSOR) 100 MG tablet Take 100 mg by mouth 2 (two) times daily.    . Multiple Vitamin (MULITIVITAMIN WITH MINERALS) TABS Take 1 tablet by mouth daily.    . pravastatin (PRAVACHOL) 20 MG tablet Take 1 tablet by mouth daily.    . traMADol (ULTRAM) 50 MG tablet Take 50 mg by mouth every 6 (six) hours as needed for moderate pain.     . Vitamin D, Ergocalciferol, (DRISDOL) 50000 UNITS CAPS capsule Take 50,000 Units by mouth every 7 (seven) days. Wednesday    . varenicline (CHANTIX PAK) 0.5 MG X 11 & 1 MG X 42  tablet Take one 0.5 mg tablet by mouth once daily for 3 days, then increase to one 0.5 mg tablet twice daily for 4 days, then increase to one 1 mg tablet twice daily. 53 tablet 0   No current facility-administered medications for this visit.    LABS/IMAGING: No results found for this or any previous visit (from the past 48 hour(s)). No results found.  VITALS: BP 122/84 mmHg  Pulse 64  Ht 5' 7.5" (1.715 m)  Wt 176 lb 11.2 oz (80.151 kg)  BMI 27.25 kg/m2  EXAM: deferred  EKG: deferred  ASSESSMENT: 1. Chest pain and dyspnea - negative nuclear stress test 2. Elevated coronary artery calcium score 3. Rheumatoid arthritis 4. Diabetes type 2 5. Dyslipidemia 6. Hypertension 7. Tobacco abuse 8. Peripheral arterial disease  PLAN: 1.   Hayley Hogan had a negative nuclear stress test which is reassuring. She does have coronary artery disease and cardiovascular disease with abdominal aortic atherosclerosis and calcification which extends into the iliofemoral arteries. She is asymptomatic for any peripheral arterial disease. I recommend she continue on aspirin. She has been recently started on pravastatin. Her goal LDL should be less than 70. She may benefit from advanced particle testing which I would be happy to perform. I would like to see her back in one year.  Thank you again for the kind referral.  Pixie Casino, MD, [facility] Attending Cardiologist [facility]  Amario Longmore C 04/27/2014, 10:13 AM

## 2014-04-30 ENCOUNTER — Encounter: Payer: Self-pay | Admitting: Internal Medicine

## 2014-10-08 ENCOUNTER — Emergency Department (HOSPITAL_COMMUNITY): Payer: BC Managed Care – PPO

## 2014-10-08 ENCOUNTER — Encounter (HOSPITAL_COMMUNITY): Payer: Self-pay | Admitting: Emergency Medicine

## 2014-10-08 ENCOUNTER — Inpatient Hospital Stay (HOSPITAL_COMMUNITY)
Admission: EM | Admit: 2014-10-08 | Discharge: 2014-10-10 | DRG: 247 | Disposition: A | Discharge status: Home / Self Care | Payer: BC Managed Care – PPO | Attending: Internal Medicine | Admitting: Internal Medicine

## 2014-10-08 DIAGNOSIS — K760 Fatty (change of) liver, not elsewhere classified: Secondary | ICD-10-CM | POA: Diagnosis present

## 2014-10-08 DIAGNOSIS — R079 Chest pain, unspecified: Secondary | ICD-10-CM | POA: Diagnosis not present

## 2014-10-08 DIAGNOSIS — F17210 Nicotine dependence, cigarettes, uncomplicated: Secondary | ICD-10-CM | POA: Diagnosis present

## 2014-10-08 DIAGNOSIS — M069 Rheumatoid arthritis, unspecified: Secondary | ICD-10-CM | POA: Diagnosis present

## 2014-10-08 DIAGNOSIS — I214 Non-ST elevation (NSTEMI) myocardial infarction: Secondary | ICD-10-CM | POA: Diagnosis not present

## 2014-10-08 DIAGNOSIS — I2510 Atherosclerotic heart disease of native coronary artery without angina pectoris: Secondary | ICD-10-CM | POA: Diagnosis present

## 2014-10-08 DIAGNOSIS — Z7982 Long term (current) use of aspirin: Secondary | ICD-10-CM

## 2014-10-08 DIAGNOSIS — I739 Peripheral vascular disease, unspecified: Secondary | ICD-10-CM | POA: Diagnosis present

## 2014-10-08 DIAGNOSIS — E119 Type 2 diabetes mellitus without complications: Secondary | ICD-10-CM

## 2014-10-08 DIAGNOSIS — Z955 Presence of coronary angioplasty implant and graft: Secondary | ICD-10-CM | POA: Insufficient documentation

## 2014-10-08 DIAGNOSIS — I700 Atherosclerosis of aorta: Secondary | ICD-10-CM | POA: Diagnosis present

## 2014-10-08 DIAGNOSIS — Z79899 Other long term (current) drug therapy: Secondary | ICD-10-CM

## 2014-10-08 DIAGNOSIS — Z91010 Allergy to peanuts: Secondary | ICD-10-CM

## 2014-10-08 DIAGNOSIS — I249 Acute ischemic heart disease, unspecified: Secondary | ICD-10-CM | POA: Insufficient documentation

## 2014-10-08 DIAGNOSIS — Z888 Allergy status to other drugs, medicaments and biological substances status: Secondary | ICD-10-CM

## 2014-10-08 DIAGNOSIS — I10 Essential (primary) hypertension: Secondary | ICD-10-CM | POA: Diagnosis present

## 2014-10-08 LAB — BASIC METABOLIC PANEL
ANION GAP: 11 (ref 5–15)
BUN: 10 mg/dL (ref 6–23)
CHLORIDE: 102 mmol/L (ref 96–112)
CO2: 25 mmol/L (ref 19–32)
CREATININE: 1.01 mg/dL (ref 0.50–1.10)
Calcium: 8.4 mg/dL (ref 8.4–10.5)
GFR, EST AFRICAN AMERICAN: 66 mL/min — AB (ref >90)
GFR, EST NON AFRICAN AMERICAN: 57 mL/min — AB (ref >90)
Glucose, Bld: 260 mg/dL — ABNORMAL HIGH (ref 70–99)
Potassium: 3.8 mmol/L (ref 3.5–5.1)
SODIUM: 138 mmol/L (ref 135–145)

## 2014-10-08 LAB — I-STAT TROPONIN, ED: Troponin i, poc: 0.02 ng/mL (ref 0.00–0.08)

## 2014-10-08 NOTE — ED Provider Notes (Signed)
CSN: [id number]     Arrival date & time 10/08/14  2157 History   First MD Initiated Contact with Patient 10/08/14 2201     Chief Complaint  Patient presents with  . Chest Pain     (Consider location/radiation/quality/duration/timing/severity/associated sxs/prior Treatment) HPI Wilmary Levit is a 69 y.o. female with history of hypertension, rheumatoid arthritis on Plaquenil, diabetes, coronary artery disease, presents to emergency department complaining of chest pain. Patient states that her pain has been intermittent for the last 3 weeks. She states it usually comes and goes lasting just a few minutes at a time. Pain is in the left chest radiating into the left arm. She states she had an episode this morning that lasted several minutes and resolved on its own. However this afternoon patient started having similar pain and it persisted so patient decided to come to emergency department. She states this afternoon pain also radiated into the left jaw. She denies any exertional symptoms, but states she does not do anything strenuous ever. She states this pain is or random and are not associated with any activity. She denies any associated shortness of breath, dizziness, lightheadedness, diaphoresis. Patient had a negative stress test in October 2015. She has never had a cardiac catheterization. Patient states she took 1 g of aspirin prior to coming to emergency department. Currently pain is resolved.  Past Medical History  Diagnosis Date  . Hypertension   . Neuromuscular disorder   . Migraines     "when I was very young; before 1968"  . Rheumatoid arthritis(714.0)   . Transverse myelitis 1979    w/transient inability to walk  . DM type 2 (diabetes mellitus, type 2)   . Fatty liver disease, nonalcoholic    Past Surgical History  Procedure Laterality Date  . Vaginal hysterectomy  1982  . Orif ankle fracture  09/2008    right  . Breast lumpectomy  1970    right  . Fracture surgery      plates  and screws in right ankle  . Tubal ligation    . Colonoscopy N/A 05/11/2013    Procedure: COLONOSCOPY;  Surgeon: Ladene Artist, MD;  Location: [facility] ENDOSCOPY;  Service: Endoscopy;  Laterality: N/A;   Family History  Problem Relation Age of Onset  . Hypertension Mother   . Cancer Father   . Heart Problems Maternal Grandfather   . Cancer Brother   . Cancer Brother   . Other Brother     GSW  . Stroke Sister     x2  . Hypertension Sister     x2  . COPD Sister    History  Substance Use Topics  . Smoking status: Current Some Day Smoker -- 0.20 packs/day for 35 years    Types: Cigarettes    Last Attempt to Quit: 01/14/2011  . Smokeless tobacco: Never Used  . Alcohol Use: 0.6 oz/week    1 Glasses of wine per week   OB History    No data available     Review of Systems  Constitutional: Negative for fever and chills.  Respiratory: Positive for chest tightness. Negative for cough and shortness of breath.   Cardiovascular: Positive for chest pain. Negative for palpitations and leg swelling.  Gastrointestinal: Negative for nausea, vomiting, abdominal pain and diarrhea.  Genitourinary: Negative for dysuria and flank pain.  Musculoskeletal: Negative for myalgias, arthralgias, neck pain and neck stiffness.  Skin: Negative for rash.  Neurological: Negative for dizziness, weakness and headaches.  All other systems  reviewed and are negative.     Allergies  Demerol and Peanut-containing drug products  Home Medications   Prior to Admission medications   Medication Sig Start Date End Date Taking? Authorizing Provider  aspirin 81 MG tablet Take 81 mg by mouth daily.    Historical Provider, MD  folic acid (FOLVITE) 480 MCG tablet Take 800 mcg by mouth daily.    Historical Provider, MD  hydroxychloroquine (PLAQUENIL) 200 MG tablet Take 200 mg by mouth 2 (two) times daily.    Historical Provider, MD  metFORMIN (GLUCOPHAGE) 500 MG tablet Take 500 mg by mouth every evening.    Historical  Provider, MD  methotrexate (RHEUMATREX) 2.5 MG tablet Take 12.5 mg by mouth once a week. Wednesday    Historical Provider, MD  metoprolol (LOPRESSOR) 100 MG tablet Take 100 mg by mouth 2 (two) times daily.    Historical Provider, MD  Multiple Vitamin (MULITIVITAMIN WITH MINERALS) TABS Take 1 tablet by mouth daily.    Historical Provider, MD  pravastatin (PRAVACHOL) 20 MG tablet Take 1 tablet by mouth daily. 03/09/14   Historical Provider, MD  traMADol (ULTRAM) 50 MG tablet Take 50 mg by mouth every 6 (six) hours as needed for moderate pain.     Historical Provider, MD  varenicline (CHANTIX PAK) 0.5 MG X 11 & 1 MG X 42 tablet Take one 0.5 mg tablet by mouth once daily for 3 days, then increase to one 0.5 mg tablet twice daily for 4 days, then increase to one 1 mg tablet twice daily. 03/15/14   Pixie Casino, MD  Vitamin D, Ergocalciferol, (DRISDOL) 50000 UNITS CAPS capsule Take 50,000 Units by mouth every 7 (seven) days. Wednesday    Historical Provider, MD   BP 126/83 mmHg  Temp(Src) 98.6 F (37 C) (Oral)  Resp 16  Ht 5' 7.5" (1.715 m)  Wt 172 lb (78.019 kg)  BMI 26.53 kg/m2  SpO2 96% Physical Exam  Constitutional: She is oriented to person, place, and time. She appears well-developed and well-nourished. No distress.  HENT:  Head: Normocephalic.  Eyes: Conjunctivae are normal.  Neck: Neck supple.  Cardiovascular: Normal rate, regular rhythm and normal heart sounds.   Pulmonary/Chest: Effort normal and breath sounds normal. No respiratory distress. She has no wheezes. She has no rales. She exhibits no tenderness.  Abdominal: Soft. Bowel sounds are normal. She exhibits no distension. There is no tenderness. There is no rebound.  Musculoskeletal: She exhibits no edema.  Neurological: She is alert and oriented to person, place, and time.  Skin: Skin is warm and dry.  Psychiatric: She has a normal mood and affect. Her behavior is normal.  Nursing note and vitals reviewed.   ED Course   Procedures (including critical care time) Labs Review Labs Reviewed  CBC WITH DIFFERENTIAL/PLATELET  BASIC METABOLIC PANEL  I-STAT [place], ED    Imaging Review No results found.   EKG Interpretation   Date/Time:  Monday October 08 2014 22:06:49 EDT Ventricular Rate:  68 PR Interval:  164 QRS Duration: 100 QT Interval:  425 QTC Calculation: 452 R Axis:   64 Text Interpretation:  Sinus rhythm Borderline low voltage, extremity leads  Minimal ST depression, inferior leads Baseline wander in lead(s) II III  aVF since last tracing no significant change Confirmed by Eulis Foster  MD,  ELLIOTT ([id number]) on 10/08/2014 10:18:49 PM      MDM   Final diagnoses:  Chest pain, unspecified chest pain type    Patient with multiple episodes of  atypical chest pain over last 3 weeks, today symptoms have been lasting longer than usual with radiation to the left jaw and left hand. Patient currently is symptom free. She took 1 g of aspirin prior to coming in. Will get chest x-ray, labs. EKG is unchanged.  11:49 PM Pt with multiple risk factors for CAD including PAD, smoking, hyperlipidimia, DM, age. Heart score of 6. Spoke with Dr. Claiborne Billings  Cardiology. Given no active CP, negative work up so far, asked for medicine admission.   Spoke with medicine will admit  Jeannett Senior, PA-C 10/09/14 0057  Daleen Bo, MD 10/10/14 [phone]

## 2014-10-08 NOTE — ED Notes (Signed)
Per EMS: Pt c/o chest pressure that began approx 1 month ago and has been intermittent since.  Tonight pt was cooking dinner in the kitchen and felt weak.  Pt sts she sat down to rest and began to experience squeezing pain in her left chest.  The pain moved down her arm and in to her neck.  Pt took one BC power with 1g ASA in it.  Pt also sts she gets dizzy when she stands.  No nitro during transport.  Pt also c/o ankle swelling, which is known by her MD.  AxO in room.

## 2014-10-08 NOTE — ED Provider Notes (Signed)
  Face-to-face evaluation   History: Hayley Hogan is a 69 y.o. female who presents for evaluation  of a tight feeling, chest, that lasted one half hours, and started tonight while at rest. She has had similar pain in the past month when she exerts herself. She had a cardiac stress test October 2015, which was normal. There is no associated diaphoresis, nausea, or trouble breathing. Pain radiated to her left arm and neck.   Physical exam: Alert, elderly appearing female in mild distress. Heart regular rate and rhythm without murmur. Lungs clear to auscultation.    Medical screening examination/treatment/procedure(s) were conducted as a shared visit with non-physician practitioner(s) and myself.  I personally evaluated the patient during the encounter  Daleen Bo, MD 10/10/14 [phone]

## 2014-10-08 NOTE — ED Notes (Signed)
Dr Debara Pickett is pt's cardiologist

## 2014-10-08 NOTE — ED Notes (Signed)
Patient transported to X-ray 

## 2014-10-08 NOTE — ED Notes (Signed)
Dr. Wentz at bedside. 

## 2014-10-09 ENCOUNTER — Encounter (HOSPITAL_COMMUNITY)
Admission: EM | Disposition: A | Discharge status: Home / Self Care | Payer: BC Managed Care – PPO | Source: Home / Self Care | Attending: Internal Medicine

## 2014-10-09 DIAGNOSIS — M069 Rheumatoid arthritis, unspecified: Secondary | ICD-10-CM | POA: Diagnosis present

## 2014-10-09 DIAGNOSIS — I214 Non-ST elevation (NSTEMI) myocardial infarction: Secondary | ICD-10-CM | POA: Diagnosis present

## 2014-10-09 DIAGNOSIS — Z888 Allergy status to other drugs, medicaments and biological substances status: Secondary | ICD-10-CM | POA: Diagnosis not present

## 2014-10-09 DIAGNOSIS — I25110 Atherosclerotic heart disease of native coronary artery with unstable angina pectoris: Secondary | ICD-10-CM

## 2014-10-09 DIAGNOSIS — I249 Acute ischemic heart disease, unspecified: Secondary | ICD-10-CM | POA: Diagnosis not present

## 2014-10-09 DIAGNOSIS — R079 Chest pain, unspecified: Secondary | ICD-10-CM | POA: Diagnosis present

## 2014-10-09 DIAGNOSIS — Z79899 Other long term (current) drug therapy: Secondary | ICD-10-CM | POA: Diagnosis not present

## 2014-10-09 DIAGNOSIS — I10 Essential (primary) hypertension: Secondary | ICD-10-CM | POA: Diagnosis present

## 2014-10-09 DIAGNOSIS — I739 Peripheral vascular disease, unspecified: Secondary | ICD-10-CM

## 2014-10-09 DIAGNOSIS — K760 Fatty (change of) liver, not elsewhere classified: Secondary | ICD-10-CM | POA: Diagnosis present

## 2014-10-09 DIAGNOSIS — I209 Angina pectoris, unspecified: Secondary | ICD-10-CM

## 2014-10-09 DIAGNOSIS — I499 Cardiac arrhythmia, unspecified: Secondary | ICD-10-CM | POA: Diagnosis not present

## 2014-10-09 DIAGNOSIS — Z91010 Allergy to peanuts: Secondary | ICD-10-CM | POA: Diagnosis not present

## 2014-10-09 DIAGNOSIS — Z7982 Long term (current) use of aspirin: Secondary | ICD-10-CM | POA: Diagnosis not present

## 2014-10-09 DIAGNOSIS — I2510 Atherosclerotic heart disease of native coronary artery without angina pectoris: Secondary | ICD-10-CM | POA: Diagnosis present

## 2014-10-09 DIAGNOSIS — E119 Type 2 diabetes mellitus without complications: Secondary | ICD-10-CM | POA: Diagnosis present

## 2014-10-09 DIAGNOSIS — E1129 Type 2 diabetes mellitus with other diabetic kidney complication: Secondary | ICD-10-CM | POA: Diagnosis not present

## 2014-10-09 DIAGNOSIS — I700 Atherosclerosis of aorta: Secondary | ICD-10-CM | POA: Diagnosis present

## 2014-10-09 DIAGNOSIS — F17210 Nicotine dependence, cigarettes, uncomplicated: Secondary | ICD-10-CM | POA: Diagnosis present

## 2014-10-09 HISTORY — PX: LEFT HEART CATHETERIZATION WITH CORONARY ANGIOGRAM: SHX5451

## 2014-10-09 HISTORY — PX: CORONARY ANGIOPLASTY WITH STENT PLACEMENT: SHX49

## 2014-10-09 LAB — COMPREHENSIVE METABOLIC PANEL
ALK PHOS: 60 U/L (ref 39–117)
ALT: 32 U/L (ref 0–35)
AST: 22 U/L (ref 0–37)
Albumin: 3.3 g/dL — ABNORMAL LOW (ref 3.5–5.2)
Anion gap: 10 (ref 5–15)
BUN: 10 mg/dL (ref 6–23)
CO2: 26 mmol/L (ref 19–32)
Calcium: 8.7 mg/dL (ref 8.4–10.5)
Chloride: 105 mmol/L (ref 96–112)
Creatinine, Ser: 0.82 mg/dL (ref 0.50–1.10)
GFR calc Af Amer: 85 mL/min — ABNORMAL LOW (ref >90)
GFR calc non Af Amer: 73 mL/min — ABNORMAL LOW (ref >90)
Glucose, Bld: 143 mg/dL — ABNORMAL HIGH (ref 70–99)
Potassium: 3.9 mmol/L (ref 3.5–5.1)
Sodium: 141 mmol/L (ref 135–145)
Total Bilirubin: 0.3 mg/dL (ref 0.3–1.2)
Total Protein: 6.1 g/dL (ref 6.0–8.3)

## 2014-10-09 LAB — GLUCOSE, CAPILLARY
Glucose-Capillary: 115 mg/dL — ABNORMAL HIGH (ref 70–99)
Glucose-Capillary: 167 mg/dL — ABNORMAL HIGH (ref 70–99)
Glucose-Capillary: 108 mg/dL — ABNORMAL HIGH (ref 70–99)
Glucose-Capillary: 103 mg/dL — ABNORMAL HIGH (ref 70–99)
Glucose-Capillary: 114 mg/dL — ABNORMAL HIGH (ref 70–99)
Glucose-Capillary: 169 mg/dL — ABNORMAL HIGH (ref 70–99)

## 2014-10-09 LAB — TROPONIN I
TROPONIN I: 0.07 ng/mL — AB (ref <0.031)
Troponin I: 0.09 ng/mL — ABNORMAL HIGH (ref <0.031)
Troponin I: 0.08 ng/mL — ABNORMAL HIGH (ref <0.031)

## 2014-10-09 LAB — CBC WITH DIFFERENTIAL/PLATELET
BASOS PCT: 0 % (ref 0–1)
Basophils Absolute: 0 10*3/uL (ref 0.0–0.1)
Eosinophils Absolute: 0.1 10*3/uL (ref 0.0–0.7)
Eosinophils Relative: 2 % (ref 0–5)
HCT: 41 % (ref 36.0–46.0)
HEMOGLOBIN: 13.8 g/dL (ref 12.0–15.0)
LYMPHS PCT: 22 % (ref 12–46)
Lymphs Abs: 1.6 10*3/uL (ref 0.7–4.0)
MCH: 30.7 pg (ref 26.0–34.0)
MCHC: 33.7 g/dL (ref 30.0–36.0)
MCV: 91.1 fL (ref 78.0–100.0)
Monocytes Absolute: 0.6 10*3/uL (ref 0.1–1.0)
Monocytes Relative: 8 % (ref 3–12)
Neutro Abs: 5 10*3/uL (ref 1.7–7.7)
Neutrophils Relative %: 68 % (ref 43–77)
PLATELETS: 110 10*3/uL — AB (ref 150–400)
RBC: 4.5 MIL/uL (ref 3.87–5.11)
RDW: 13.1 % (ref 11.5–15.5)
WBC: 7.4 10*3/uL (ref 4.0–10.5)

## 2014-10-09 LAB — LIPID PANEL
CHOLESTEROL: 143 mg/dL (ref 0–200)
HDL: 47 mg/dL (ref >39)
LDL Cholesterol: 68 mg/dL (ref 0–99)
Total CHOL/HDL Ratio: 3 RATIO
Triglycerides: 139 mg/dL (ref <150)
VLDL: 28 mg/dL (ref 0–40)

## 2014-10-09 LAB — CBC
HCT: 41.2 % (ref 36.0–46.0)
Hemoglobin: 13.7 g/dL (ref 12.0–15.0)
MCH: 30.4 pg (ref 26.0–34.0)
MCHC: 33.3 g/dL (ref 30.0–36.0)
MCV: 91.4 fL (ref 78.0–100.0)
Platelets: 108 10*3/uL — ABNORMAL LOW (ref 150–400)
RBC: 4.51 MIL/uL (ref 3.87–5.11)
RDW: 13.2 % (ref 11.5–15.5)
WBC: 7.4 10*3/uL (ref 4.0–10.5)

## 2014-10-09 LAB — TSH: TSH: 3.805 u[IU]/mL (ref 0.350–4.500)

## 2014-10-09 LAB — POCT ACTIVATED CLOTTING TIME: Activated Clotting Time: 632 seconds

## 2014-10-09 LAB — PROTIME-INR
INR: 0.99 (ref 0.00–1.49)
PROTHROMBIN TIME: 13.2 s (ref 11.6–15.2)

## 2014-10-09 SURGERY — LEFT HEART CATHETERIZATION WITH CORONARY ANGIOGRAM

## 2014-10-09 MED ORDER — ASPIRIN 81 MG PO CHEW: 81.0000 mg | CHEWABLE_TABLET | ORAL | Status: AC

## 2014-10-09 MED ORDER — ASPIRIN 81 MG PO CHEW
81.0000 mg | CHEWABLE_TABLET | Freq: Every day | ORAL | Status: DC
  Administered 2014-10-10: 10:00:00 81 mg via ORAL
  Filled 2014-10-09: qty 1

## 2014-10-09 MED ORDER — TICAGRELOR 90 MG PO TABS
ORAL_TABLET | ORAL | Status: AC
  Filled 2014-10-09: qty 2

## 2014-10-09 MED ORDER — MORPHINE SULFATE 2 MG/ML IJ SOLN: 2.0000 mg | INTRAMUSCULAR | Status: DC | PRN

## 2014-10-09 MED ORDER — TICAGRELOR 90 MG PO TABS
90.0000 mg | ORAL_TABLET | Freq: Two times a day (BID) | ORAL | Status: DC
  Administered 2014-10-10: 05:00:00 90 mg via ORAL
  Filled 2014-10-09 (×3): qty 1

## 2014-10-09 MED ORDER — METOPROLOL TARTRATE 25 MG PO TABS
100.0000 mg | ORAL_TABLET | Freq: Two times a day (BID) | ORAL | Status: DC
  Administered 2014-10-09 – 2014-10-10 (×3): 100 mg via ORAL
  Filled 2014-10-09: qty 1
  Filled 2014-10-09: qty 4
  Filled 2014-10-09: qty 1
  Filled 2014-10-09: qty 4

## 2014-10-09 MED ORDER — INSULIN ASPART 100 UNIT/ML ~~LOC~~ SOLN
0.0000 [IU] | SUBCUTANEOUS | Status: DC
  Administered 2014-10-09 (×2): 2 [IU] via SUBCUTANEOUS

## 2014-10-09 MED ORDER — ASPIRIN 81 MG PO TABS: 81.0000 mg | ORAL_TABLET | Freq: Every day | ORAL | Status: DC

## 2014-10-09 MED ORDER — MORPHINE SULFATE 2 MG/ML IJ SOLN
INTRAMUSCULAR | Status: AC
Start: 2014-10-09 — End: 2014-10-09
  Filled 2014-10-09: qty 1

## 2014-10-09 MED ORDER — INSULIN ASPART 100 UNIT/ML ~~LOC~~ SOLN
0.0000 [IU] | Freq: Three times a day (TID) | SUBCUTANEOUS | Status: DC
  Administered 2014-10-10: 1 [IU] via SUBCUTANEOUS

## 2014-10-09 MED ORDER — NITROGLYCERIN 0.4 MG/SPRAY TL SOLN
Status: AC
  Filled 2014-10-09: qty 4.9

## 2014-10-09 MED ORDER — BIVALIRUDIN 250 MG IV SOLR
INTRAVENOUS | Status: AC
  Filled 2014-10-09: qty 250

## 2014-10-09 MED ORDER — VERAPAMIL HCL 2.5 MG/ML IV SOLN
INTRAVENOUS | Status: AC
Start: 2014-10-09 — End: 2014-10-09
  Filled 2014-10-09: qty 2

## 2014-10-09 MED ORDER — SODIUM CHLORIDE 0.9 % IV SOLN: 250.0000 mL | INTRAVENOUS | Status: DC | PRN

## 2014-10-09 MED ORDER — ASPIRIN EC 325 MG PO TBEC: 325.0000 mg | DELAYED_RELEASE_TABLET | Freq: Every day | ORAL | Status: DC

## 2014-10-09 MED ORDER — NITROGLYCERIN 1 MG/10 ML FOR IR/CATH LAB
INTRA_ARTERIAL | Status: AC
  Filled 2014-10-09: qty 10

## 2014-10-09 MED ORDER — HEPARIN (PORCINE) IN NACL 2-0.9 UNIT/ML-% IJ SOLN
INTRAMUSCULAR | Status: AC
  Filled 2014-10-09: qty 1000

## 2014-10-09 MED ORDER — METHOTREXATE 2.5 MG PO TABS
12.5000 mg | ORAL_TABLET | ORAL | Status: DC
  Administered 2014-10-10: 12.5 mg via ORAL
  Filled 2014-10-09: qty 5

## 2014-10-09 MED ORDER — LIDOCAINE HCL (PF) 1 % IJ SOLN
INTRAMUSCULAR | Status: AC
Start: 2014-10-09 — End: 2014-10-09
  Filled 2014-10-09: qty 30

## 2014-10-09 MED ORDER — MIDAZOLAM HCL 2 MG/2ML IJ SOLN
INTRAMUSCULAR | Status: AC
  Filled 2014-10-09: qty 2

## 2014-10-09 MED ORDER — SODIUM CHLORIDE 0.9 % IJ SOLN
3.0000 mL | Freq: Two times a day (BID) | INTRAMUSCULAR | Status: DC
Start: 2014-10-10 — End: 2014-10-09

## 2014-10-09 MED ORDER — PRAVASTATIN SODIUM 20 MG PO TABS
20.0000 mg | ORAL_TABLET | Freq: Every day | ORAL | Status: DC
  Administered 2014-10-09 – 2014-10-10 (×2): 20 mg via ORAL
  Filled 2014-10-09 (×2): qty 1

## 2014-10-09 MED ORDER — HYDROXYCHLOROQUINE SULFATE 200 MG PO TABS
200.0000 mg | ORAL_TABLET | Freq: Two times a day (BID) | ORAL | Status: DC
  Administered 2014-10-09 – 2014-10-10 (×3): 200 mg via ORAL
  Filled 2014-10-09 (×6): qty 1

## 2014-10-09 MED ORDER — HEPARIN SODIUM (PORCINE) 1000 UNIT/ML IJ SOLN
INTRAMUSCULAR | Status: AC
  Filled 2014-10-09: qty 1

## 2014-10-09 MED ORDER — LOSARTAN POTASSIUM 50 MG PO TABS
100.0000 mg | ORAL_TABLET | Freq: Every day | ORAL | Status: DC
  Administered 2014-10-09 – 2014-10-10 (×2): 100 mg via ORAL
  Filled 2014-10-09 (×2): qty 2

## 2014-10-09 MED ORDER — SODIUM CHLORIDE 0.9 % IJ SOLN
3.0000 mL | Freq: Two times a day (BID) | INTRAMUSCULAR | Status: DC
Start: 2014-10-09 — End: 2014-10-09

## 2014-10-09 MED ORDER — HEPARIN SODIUM (PORCINE) 5000 UNIT/ML IJ SOLN
5000.0000 [IU] | Freq: Three times a day (TID) | INTRAMUSCULAR | Status: DC
  Administered 2014-10-09 – 2014-10-10 (×2): 5000 [IU] via SUBCUTANEOUS
  Filled 2014-10-09 (×5): qty 1

## 2014-10-09 MED ORDER — SODIUM CHLORIDE 0.9 % IV SOLN
1.0000 mL/kg/h | INTRAVENOUS | Status: DC
  Administered 2014-10-09: 1 mL/kg/h via INTRAVENOUS

## 2014-10-09 MED ORDER — ACETAMINOPHEN 325 MG PO TABS
650.0000 mg | ORAL_TABLET | ORAL | Status: DC | PRN
  Administered 2014-10-09: 650 mg via ORAL
  Filled 2014-10-09: qty 2

## 2014-10-09 MED ORDER — SODIUM CHLORIDE 0.9 % IJ SOLN: 3.0000 mL | INTRAMUSCULAR | Status: DC | PRN

## 2014-10-09 MED ORDER — NITROGLYCERIN IN D5W 200-5 MCG/ML-% IV SOLN
INTRAVENOUS | Status: AC
  Filled 2014-10-09: qty 250

## 2014-10-09 MED ORDER — VERAPAMIL HCL 2.5 MG/ML IV SOLN
INTRAVENOUS | Status: AC
  Filled 2014-10-09: qty 2

## 2014-10-09 MED ORDER — ONDANSETRON HCL 4 MG/2ML IJ SOLN
4.0000 mg | Freq: Four times a day (QID) | INTRAMUSCULAR | Status: DC | PRN
  Administered 2014-10-09: 20:00:00 4 mg via INTRAVENOUS
  Filled 2014-10-09: qty 2

## 2014-10-09 MED ORDER — FENTANYL CITRATE (PF) 100 MCG/2ML IJ SOLN
INTRAMUSCULAR | Status: AC
  Filled 2014-10-09: qty 2

## 2014-10-09 MED ORDER — SODIUM CHLORIDE 0.9 % IV SOLN
1.0000 mL/kg/h | INTRAVENOUS | Status: AC
Start: 2014-10-09 — End: 2014-10-09
  Administered 2014-10-09: 19:00:00 1 mL/kg/h via INTRAVENOUS

## 2014-10-09 MED ORDER — ASPIRIN EC 325 MG PO TBEC
325.0000 mg | DELAYED_RELEASE_TABLET | Freq: Every day | ORAL | Status: DC
  Administered 2014-10-09: 325 mg via ORAL
  Filled 2014-10-09: qty 1

## 2014-10-09 MED ORDER — NITROGLYCERIN IN D5W 200-5 MCG/ML-% IV SOLN
0.0000 ug/min | INTRAVENOUS | Status: DC
  Administered 2014-10-09: 19:00:00 3 ug/min via INTRAVENOUS

## 2014-10-09 NOTE — Progress Notes (Signed)
Elberta Fortis RN paged 2 abnormal troponins. Barely elevated. New EKG without acute changes as compared to ED EKG. Pt is CHEST PAIN FREE. VSS. Pt is on full dose ASA, BB and a statin. Cardiology to see today.  KJKG, NP

## 2014-10-09 NOTE — Interval H&P Note (Signed)
APPROPRIATENESS OF USE  TIMI Score  Patient Information:  TIMI Score is 4   UA/NSTEMI and intermediate-risk features (e.g., TIMI score 3-4) for short-term risk of death or nonfatal MI  Revascularization of the presumed culprit artery   A (8)  Indication: 10; Score: 8    10/09/2014 4:45 PM

## 2014-10-09 NOTE — Progress Notes (Signed)
UR completed 

## 2014-10-09 NOTE — H&P (View-Only) (Signed)
Reason for Consult: NSTEMI  Requesting Physician: Candiss Norse  HPI: Hayley Hogan is a 69 year old mildly overweight married Caucasian female mother of 5 children admitted with chest pain and minimally positive enzymes. Her cardiac risk factors are notable for tobacco abuse, hypertension, hyperlipidemia, diabetes and family history (brother had 2 stents). She currently had a CT scan of her chest last year which showed coronary calcification and a Myoview stress test in October that was low risk and nonischemic. She's had 5 episodes of chest pain usually lasting a minute at a time however the the episode yesterday lasted several minutes and had associated left upper extremity radiation. Her EKG shows anterior T-wave inversion however this is not changed from her prior EKG in October. Her troponins are minimally elevated.   Problem List: Patient Active Problem List   Diagnosis Date Noted  . PAD (peripheral artery disease) 04/27/2014  . Chest pain 03/15/2014  . Elevated coronary artery calcium score 03/15/2014  . Lung nodule 05/11/2013  . Osteopenia 05/11/2013  . Atherosclerosis of aorta 05/11/2013  . Cirrhosis of liver without mention of alcohol 05/11/2013  . Thrombocytopenia, unspecified 05/11/2013  . Benign neoplasm of colon 05/11/2013  . Lower GI bleed 05/09/2013  . DM2 (diabetes mellitus, type 2) 05/09/2013  . Essential hypertension, benign 05/09/2013  . Acute lower GI bleeding 05/09/2013  . Chronic cholecystitis 02/18/2012  . Rheumatoid arthritis 01/07/2012  . Headache(784.0) 10/01/2011    Class: Acute  . Nausea and vomiting 10/01/2011    Class: Acute  . Hypertension 10/01/2011    Class: Acute    PMHx:  Past Medical History  Diagnosis Date  . Hypertension   . Neuromuscular disorder   . Migraines     "when I was very young; before 1968"  . Rheumatoid arthritis(714.0)   . Transverse myelitis 1979    w/transient inability to walk  . DM type 2 (diabetes mellitus, type 2)    . Fatty liver disease, nonalcoholic    Past Surgical History  Procedure Laterality Date  . Vaginal hysterectomy  1982  . Orif ankle fracture  09/2008    right  . Breast lumpectomy  1970    right  . Fracture surgery      plates and screws in right ankle  . Tubal ligation    . Colonoscopy N/A 05/11/2013    Procedure: COLONOSCOPY;  Surgeon: Ladene Artist, MD;  Location: [facility] ENDOSCOPY;  Service: Endoscopy;  Laterality: N/A;    FAMHx: Family History  Problem Relation Age of Onset  . Hypertension Mother   . Cancer Father   . Heart Problems Maternal Grandfather   . Cancer Brother   . Cancer Brother   . Other Brother     GSW  . Stroke Sister     x2  . Hypertension Sister     x2  . COPD Sister     SOCHx:  reports that she has been smoking Cigarettes.  She has a 7 pack-year smoking history. She has never used smokeless tobacco. She reports that she drinks about 0.6 oz of alcohol per week. She reports that she uses illicit drugs (Marijuana).  ALLERGIES: Allergies  Allergen Reactions  . Demerol Other (See Comments)    vomiting  . Peanut-Containing Drug Products     Mouth swelling    ROS: Pertinent items are noted in HPI.  HOME MEDICATIONS: Prescriptions prior to admission  Medication Sig Dispense Refill Last Dose  . aspirin 81 MG tablet Take 81 mg by mouth  daily.   10/09/2014 at Unknown time  . folic acid (FOLVITE) 161 MCG tablet Take 800 mcg by mouth daily.   10/09/2014 at Unknown time  . hydroxychloroquine (PLAQUENIL) 200 MG tablet Take 200 mg by mouth 2 (two) times daily.   10/09/2014 at Unknown time  . losartan (COZAAR) 100 MG tablet Take 100 mg by mouth daily.    10/09/2014 at Unknown time  . metFORMIN (GLUCOPHAGE) 500 MG tablet Take 500 mg by mouth every evening.   10/09/2014 at Unknown time  . methotrexate (RHEUMATREX) 2.5 MG tablet Take 12.5 mg by mouth once a week. Wednesday   Past Week at Unknown time  . metoprolol (LOPRESSOR) 100 MG tablet Take 100 mg by mouth 2  (two) times daily.   10/09/2014 at 1800  . Multiple Vitamin (MULITIVITAMIN WITH MINERALS) TABS Take 1 tablet by mouth daily.   10/09/2014 at Unknown time  . pravastatin (PRAVACHOL) 20 MG tablet Take 1 tablet by mouth daily.   10/09/2014 at Unknown time  . traMADol (ULTRAM) 50 MG tablet Take 50 mg by mouth every 6 (six) hours as needed for moderate pain.    Past Week at Unknown time  . Vitamin D, Ergocalciferol, (DRISDOL) 50000 UNITS CAPS capsule Take 50,000 Units by mouth every 7 (seven) days. Wednesday   Past Week at Unknown time  . varenicline (CHANTIX PAK) 0.5 MG X 11 & 1 MG X 42 tablet Take one 0.5 mg tablet by mouth once daily for 3 days, then increase to one 0.5 mg tablet twice daily for 4 days, then increase to one 1 mg tablet twice daily. (Patient not taking: Reported on 10/09/2014) 53 tablet 0 Not Taking at Unknown time    HOSPITAL MEDICATIONS: I have reviewed the patient's current medications.  VITALS: Blood pressure 125/87, pulse 62, temperature 98.5 F (36.9 C), temperature source Oral, resp. rate 18, height 5' 7.5" (1.715 m), weight 172 lb (78.019 kg), SpO2 99 %.  INPUT/OUTPUT        PHYSICAL EXAM: General appearance: alert and no distress Neck: no adenopathy, no carotid bruit, no JVD, supple, symmetrical, trachea midline and thyroid not enlarged, symmetric, no tenderness/mass/nodules Lungs: clear to auscultation bilaterally Heart: regular rate and rhythm, S1, S2 normal, no murmur, click, rub or gallop Extremities: extremities normal, atraumatic, no cyanosis or edema  LABS:  BMP  Recent Labs  10/08/14 2236 10/09/14 0217  NA 138 141  K 3.8 3.9  CL 102 105  CO2 25 26  GLUCOSE 260* 143*  BUN 10 10  CREATININE 1.01 0.82  CALCIUM 8.4 8.7  GFRNONAA 57* 73*  GFRAA 66* 85*    CBC  Recent Labs Lab 10/09/14 0217  WBC 7.4  RBC 4.51  HGB 13.7  HCT 41.2  PLT 108*  MCV 91.4    HEMOGLOBIN A1C Lab Results  Component Value Date   HGBA1C 7.0* 05/09/2013   MPG  154* 05/09/2013    Cardiac Panel (last 3 results)  Recent Labs  10/09/14 0217 10/09/14 0525  TROPONINI 0.07* 0.09*    BNP (last 3 results) No results for input(s): PROBNP in the last 8760 hours.  TSH  Recent Labs  10/09/14 0217  TSH 3.805    CHOLESTEROL  Recent Labs  10/09/14 0217  CHOL 143    Hepatic Function Panel  Recent Labs  10/09/14 0217  PROT 6.1  ALBUMIN 3.3*  AST 22  ALT 32  ALKPHOS 60  BILITOT 0.3    IMAGING: Dg Chest 2 View  10/08/2014  CLINICAL DATA:  Acute onset of left-sided chest pain, radiating to the left arm and jaw. Initial encounter.  EXAM: CHEST  2 VIEW  COMPARISON:  Chest radiograph performed 04/24/2014  FINDINGS: The lungs are well-aerated and clear. There is no evidence of focal opacification, pleural effusion or pneumothorax.  The heart is normal in size; the mediastinal contour is within normal limits. No acute osseous abnormalities are seen.  IMPRESSION: No acute cardiopulmonary process seen.   Electronically Signed   By: Garald Balding M.D.   On: 10/08/2014 23:14    EKG- NSR with ant TWI ( I personally reviewed this EKG)  IMPRESSION: 1. Chest pain: Patient has 5 cardiac risk factors, calcium on chest CT in her coronary arteries, a story worrisome for unstable angina and minimally positive enzymes. 2. Hyperlipidemia-on statin therapy followed by her PCP 3. Hypertension-on appropriate antihypertensive therapy   RECOMMENDATION: 1. Based on her story and positive enzymes she'll need diagnostic coronary arteriography this morning. I will keep nothing by mouth. I thoroughly discussed this and benefits including but not limited to death, stroke, radiocontrast nephropathy, vascular access issues.  Time Spent Directly with Patient: 30 minutes  Morrison Mcbryar J 10/09/2014, 8:01 AM

## 2014-10-09 NOTE — Consult Note (Signed)
Reason for Consult: NSTEMI  Requesting Physician: Candiss Norse  HPI: Hayley Hogan is a 69 year old mildly overweight married Caucasian female mother of 5 children admitted with chest pain and minimally positive enzymes. Her cardiac risk factors are notable for tobacco abuse, hypertension, hyperlipidemia, diabetes and family history (brother had 2 stents). She currently had a CT scan of her chest last year which showed coronary calcification and a Myoview stress test in October that was low risk and nonischemic. She's had 5 episodes of chest pain usually lasting a minute at a time however the the episode yesterday lasted several minutes and had associated left upper extremity radiation. Her EKG shows anterior T-wave inversion however this is not changed from her prior EKG in October. Her troponins are minimally elevated.   Problem List: Patient Active Problem List   Diagnosis Date Noted  . PAD (peripheral artery disease) 04/27/2014  . Chest pain 03/15/2014  . Elevated coronary artery calcium score 03/15/2014  . Lung nodule 05/11/2013  . Osteopenia 05/11/2013  . Atherosclerosis of aorta 05/11/2013  . Cirrhosis of liver without mention of alcohol 05/11/2013  . Thrombocytopenia, unspecified 05/11/2013  . Benign neoplasm of colon 05/11/2013  . Lower GI bleed 05/09/2013  . DM2 (diabetes mellitus, type 2) 05/09/2013  . Essential hypertension, benign 05/09/2013  . Acute lower GI bleeding 05/09/2013  . Chronic cholecystitis 02/18/2012  . Rheumatoid arthritis 01/07/2012  . Headache(784.0) 10/01/2011    Class: Acute  . Nausea and vomiting 10/01/2011    Class: Acute  . Hypertension 10/01/2011    Class: Acute    PMHx:  Past Medical History  Diagnosis Date  . Hypertension   . Neuromuscular disorder   . Migraines     "when I was very young; before 1968"  . Rheumatoid arthritis(714.0)   . Transverse myelitis 1979    w/transient inability to walk  . DM type 2 (diabetes mellitus, type 2)    . Fatty liver disease, nonalcoholic    Past Surgical History  Procedure Laterality Date  . Vaginal hysterectomy  1982  . Orif ankle fracture  09/2008    right  . Breast lumpectomy  1970    right  . Fracture surgery      plates and screws in right ankle  . Tubal ligation    . Colonoscopy N/A 05/11/2013    Procedure: COLONOSCOPY;  Surgeon: Ladene Artist, MD;  Location[facility] ENDOSCOPY;  Service: Endoscopy;  Laterality: N/A;    FAMHx: Family History  Problem Relation Age of Onset  . Hypertension Mother   . Cancer Father   . Heart Problems Maternal Grandfather   . Cancer Brother   . Cancer Brother   . Other Brother     GSW  . Stroke Sister     x2  . Hypertension Sister     x2  . COPD Sister     SOCHx:  reports that she has been smoking Cigarettes.  She has a 7 pack-year smoking history. She has never used smokeless tobacco. She reports that she drinks about 0.6 oz of alcohol per week. She reports that she uses illicit drugs (Marijuana).  ALLERGIES: Allergies  Allergen Reactions  . Demerol Other (See Comments)    vomiting  . Peanut-Containing Drug Products     Mouth swelling    ROS: Pertinent items are noted in HPI.  HOME MEDICATIONS: Prescriptions prior to admission  Medication Sig Dispense Refill Last Dose  . aspirin 81 MG tablet Take 81 mg by mouth  daily.   10/09/2014 at Unknown time  . folic acid (FOLVITE) 417 MCG tablet Take 800 mcg by mouth daily.   10/09/2014 at Unknown time  . hydroxychloroquine (PLAQUENIL) 200 MG tablet Take 200 mg by mouth 2 (two) times daily.   10/09/2014 at Unknown time  . losartan (COZAAR) 100 MG tablet Take 100 mg by mouth daily.    10/09/2014 at Unknown time  . metFORMIN (GLUCOPHAGE) 500 MG tablet Take 500 mg by mouth every evening.   10/09/2014 at Unknown time  . methotrexate (RHEUMATREX) 2.5 MG tablet Take 12.5 mg by mouth once a week. Wednesday   Past Week at Unknown time  . metoprolol (LOPRESSOR) 100 MG tablet Take 100 mg by mouth 2  (two) times daily.   10/09/2014 at 1800  . Multiple Vitamin (MULITIVITAMIN WITH MINERALS) TABS Take 1 tablet by mouth daily.   10/09/2014 at Unknown time  . pravastatin (PRAVACHOL) 20 MG tablet Take 1 tablet by mouth daily.   10/09/2014 at Unknown time  . traMADol (ULTRAM) 50 MG tablet Take 50 mg by mouth every 6 (six) hours as needed for moderate pain.    Past Week at Unknown time  . Vitamin D, Ergocalciferol, (DRISDOL) 50000 UNITS CAPS capsule Take 50,000 Units by mouth every 7 (seven) days. Wednesday   Past Week at Unknown time  . varenicline (CHANTIX PAK) 0.5 MG X 11 & 1 MG X 42 tablet Take one 0.5 mg tablet by mouth once daily for 3 days, then increase to one 0.5 mg tablet twice daily for 4 days, then increase to one 1 mg tablet twice daily. (Patient not taking: Reported on 10/09/2014) 53 tablet 0 Not Taking at Unknown time    HOSPITAL MEDICATIONS: I have reviewed the patient's current medications.  VITALS: Blood pressure 125/87, pulse 62, temperature 98.5 F (36.9 C), temperature source Oral, resp. rate 18, height 5' 7.5" (1.715 m), weight 172 lb (78.019 kg), SpO2 99 %.  INPUT/OUTPUT        PHYSICAL EXAM: General appearance: alert and no distress Neck: no adenopathy, no carotid bruit, no JVD, supple, symmetrical, trachea midline and thyroid not enlarged, symmetric, no tenderness/mass/nodules Lungs: clear to auscultation bilaterally Heart: regular rate and rhythm, S1, S2 normal, no murmur, click, rub or gallop Extremities: extremities normal, atraumatic, no cyanosis or edema  LABS:  BMP  Recent Labs  10/08/14 2236 10/09/14 0217  NA 138 141  K 3.8 3.9  CL 102 105  CO2 25 26  GLUCOSE 260* 143*  BUN 10 10  CREATININE 1.01 0.82  CALCIUM 8.4 8.7  GFRNONAA 57* 73*  GFRAA 66* 85*    CBC  Recent Labs Lab 10/09/14 0217  WBC 7.4  RBC 4.51  HGB 13.7  HCT 41.2  PLT 108*  MCV 91.4    HEMOGLOBIN A1C Lab Results  Component Value Date   HGBA1C 7.0* 05/09/2013   MPG  154* 05/09/2013    Cardiac Panel (last 3 results)  Recent Labs  10/09/14 0217 10/09/14 0525  TROPONINI 0.07* 0.09*    BNP (last 3 results) No results for input(s): PROBNP in the last 8760 hours.  TSH  Recent Labs  10/09/14 0217  TSH 3.805    CHOLESTEROL  Recent Labs  10/09/14 0217  CHOL 143    Hepatic Function Panel  Recent Labs  10/09/14 0217  PROT 6.1  ALBUMIN 3.3*  AST 22  ALT 32  ALKPHOS 60  BILITOT 0.3    IMAGING: Dg Chest 2 View  10/08/2014  CLINICAL DATA:  Acute onset of left-sided chest pain, radiating to the left arm and jaw. Initial encounter.  EXAM: CHEST  2 VIEW  COMPARISON:  Chest radiograph performed 04/24/2014  FINDINGS: The lungs are well-aerated and clear. There is no evidence of focal opacification, pleural effusion or pneumothorax.  The heart is normal in size; the mediastinal contour is within normal limits. No acute osseous abnormalities are seen.  IMPRESSION: No acute cardiopulmonary process seen.   Electronically Signed   By: Garald Balding M.D.   On: 10/08/2014 23:14    EKG- NSR with ant TWI ( I personally reviewed this EKG)  IMPRESSION: 1. Chest pain: Patient has 5 cardiac risk factors, calcium on chest CT in her coronary arteries, a story worrisome for unstable angina and minimally positive enzymes. 2. Hyperlipidemia-on statin therapy followed by her PCP 3. Hypertension-on appropriate antihypertensive therapy   RECOMMENDATION: 1. Based on her story and positive enzymes she'll need diagnostic coronary arteriography this morning. I will keep nothing by mouth. I thoroughly discussed this and benefits including but not limited to death, stroke, radiocontrast nephropathy, vascular access issues.  Time Spent Directly with Patient: 30 minutes  Robet Crutchfield J 10/09/2014, 8:01 AM

## 2014-10-09 NOTE — ED Notes (Signed)
Dr. Newton at bedside. 

## 2014-10-09 NOTE — Progress Notes (Signed)
Call placed to cath lab, patient next on the list for procedure.  Advised patient 1-1.5 hour approximate wait so she could call her family as per her request.

## 2014-10-09 NOTE — Progress Notes (Signed)
Patient Demographics  Hayley Hogan, is a 69 y.o. female, DOB - 1946-04-27, RXV:[id number]  Admit date - 10/08/2014   Admitting Physician Deneise Lever, MD  Outpatient Primary MD for the patient is Donnajean Lopes, MD  LOS -    Chief Complaint  Patient presents with  . Chest Pain        Subjective:   Hayley Hogan today has, No headache, No chest pain, No abdominal pain - No Nausea, No new weakness tingling or numbness, No Cough - SOB.    Assessment & Plan    1. [place]  With NSTEMI - cardiology on board, currently on aspirin, beta blocker and statin for secondary prevention. Does have history of PAD. She is currently chest pain-free and will be proceeding for left heart cath later today.   2. Smoking. Counseled to quit   3. Essential hypertension. On ARB and beta blocker continue to monitor.   4. DM type II. Hold metformin, ISS for now. Pending A1c   CBG (last 3)   Recent Labs  10/09/14 0452 10/09/14 0837 10/09/14 1103  GLUCAP 115* 167* 108*       Code Status: Full  Family Communication: None  Disposition Plan: Home   Procedures L Heart cath due   Consults  Cards   Medications  Scheduled Meds: . aspirin  81 mg Oral Pre-Cath  . aspirin EC  325 mg Oral Daily  . heparin  5,000 Units Subcutaneous 3 times per day  . hydroxychloroquine  200 mg Oral BID  . insulin aspart  0-9 Units Subcutaneous 6 times per day  . losartan  100 mg Oral Daily  . [START ON 10/10/2014] methotrexate  12.5 mg Oral Q Wed  . metoprolol  100 mg Oral BID  . pravastatin  20 mg Oral Daily  . sodium chloride  3 mL Intravenous Q12H   Continuous Infusions: . [START ON 10/10/2014] sodium chloride     PRN Meds:.sodium chloride, acetaminophen, morphine injection, ondansetron (ZOFRAN) IV, sodium  chloride  DVT Prophylaxis    Heparin   Lab Results  Component Value Date   PLT 108* 10/09/2014    Antibiotics     Anti-infectives    Start     Dose/Rate Route Frequency Ordered Stop   10/09/14 0145  hydroxychloroquine (PLAQUENIL) tablet 200 mg     200 mg Oral 2 times daily 10/09/14 0027            Objective:   Filed Vitals:   10/09/14 0045 10/09/14 0131 10/09/14 0455 10/09/14 0905  BP: 133/83 130/81 125/87   Pulse: 62 62 62   Temp:  97.8 F (36.6 C) 98.5 F (36.9 C)   TempSrc:  Oral Oral   Resp: 17 18 18    Height:      Weight:    78 kg (171 lb 15.3 oz)  SpO2: 97% 99% 99%     Wt Readings from Last 3 Encounters:  10/09/14 78 kg (171 lb 15.3 oz)  04/27/14 80.151 kg (176 lb 11.2 oz)  03/29/14 79.833 kg (176 lb)    No intake or output data in the 24 hours ending 10/09/14 1157   Physical Exam  Awake Alert, Oriented X 3, No new F.N deficits, Normal affect [place].AT,PERRAL  Supple Neck,No JVD, No cervical lymphadenopathy appriciated.  Symmetrical Chest wall movement, Good air movement bilaterally, CTAB RRR,No Gallops,Rubs or new Murmurs, No Parasternal Heave +ve B.Sounds, Abd Soft, No tenderness, No organomegaly appriciated, No rebound - guarding or rigidity. No Cyanosis, Clubbing or edema, No new Rash or bruise      Data Review   Micro Results No results found for this or any previous visit (from the past 240 hour(s)).  Radiology Reports Dg Chest 2 View  10/08/2014   CLINICAL DATA:  Acute onset of left-sided chest pain, radiating to the left arm and jaw. Initial encounter.  EXAM: CHEST  2 VIEW  COMPARISON:  Chest radiograph performed 04/24/2014  FINDINGS: The lungs are well-aerated and clear. There is no evidence of focal opacification, pleural effusion or pneumothorax.  The heart is normal in size; the mediastinal contour is within normal limits. No acute osseous abnormalities are seen.  IMPRESSION: No acute cardiopulmonary process seen.   Electronically Signed    By: Garald Balding M.D.   On: 10/08/2014 23:14     CBC  Recent Labs Lab 10/08/14 2236 10/09/14 0217  WBC 7.4 7.4  HGB 13.8 13.7  HCT 41.0 41.2  PLT 110* 108*  MCV 91.1 91.4  MCH 30.7 30.4  MCHC 33.7 33.3  RDW 13.1 13.2  LYMPHSABS 1.6  --   MONOABS 0.6  --   EOSABS 0.1  --   BASOSABS 0.0  --     Chemistries   Recent Labs Lab 10/08/14 2236 10/09/14 0217  NA 138 141  K 3.8 3.9  CL 102 105  CO2 25 26  GLUCOSE 260* 143*  BUN 10 10  CREATININE 1.01 0.82  CALCIUM 8.4 8.7  AST  --  22  ALT  --  32  ALKPHOS  --  60  BILITOT  --  0.3   ------------------------------------------------------------------------------------------------------------------ estimated creatinine clearance is 73.4 mL/min (by C-G formula based on Cr of 0.82). ------------------------------------------------------------------------------------------------------------------ No results for input(s): HGBA1C in the last 72 hours. ------------------------------------------------------------------------------------------------------------------  Recent Labs  10/09/14 0217  CHOL 143  HDL 47  LDLCALC 68  TRIG 139  CHOLHDL 3.0   ------------------------------------------------------------------------------------------------------------------  Recent Labs  10/09/14 0217  TSH 3.805   ------------------------------------------------------------------------------------------------------------------ No results for input(s): VITAMINB12, FOLATE, FERRITIN, TIBC, IRON, RETICCTPCT in the last 72 hours.  Coagulation profile No results for input(s): INR, PROTIME in the last 168 hours.  No results for input(s): DDIMER in the last 72 hours.  Cardiac Enzymes  Recent Labs Lab 10/09/14 0217 10/09/14 0525 10/09/14 0816  TROPONINI 0.07* 0.09* 0.08*   ------------------------------------------------------------------------------------------------------------------ Invalid input(s): POCBNP     Time  Spent in minutes   35   Arian Murley K M.D on 10/09/2014 at 11:57 AM  Between 7am to 7pm - Pager - [phone]  After 7pm go to [email or web address] - password [email or web address]  [facility]   Office  [phone]

## 2014-10-09 NOTE — CV Procedure (Signed)
CARDIAC CATHETERIZATION AND PERCUTANEOUS CORONARY INTERVENTION REPORT  NAME:  Hayley Hogan   MRN: [id number] DOB:  [birth year]   ADMIT DATE: 10/08/2014 Procedure Date: 10/09/2014  INTERVENTIONAL CARDIOLOGIST: Leonie Man, M.D., MS PRIMARY CARE PROVIDER: Donnajean Lopes, MD PRIMARY CARDIOLOGIST: Kenneth C (Mali) Debara Pickett, M.D.  PATIENT:  Hayley Hogan is a 69 y.o. female With a history of tobacco abuse, hypertension, hyperlipidemia and diabetes with a family history of CAD (brother with 2 stents). She presented to [facility] yesterday evening with signs and symptoms concerning for unstable angina/acute coronary syndrome. She had mild troponin elevations of 0.9. She was seen in consultation by Dr. Quay Burow who recommended cardiac catheterization. She apparently has had a stress test in the past year that was nonischemic. Her symptoms have deathly progressed.  PRE-OPERATIVE DIAGNOSIS:    Acute coronary syndrome/non-STEMI   PROCEDURES PERFORMED:    Left Heart Catheterization with Native Coronary Angiography  via Right Radial  Artery   Left Ventriculography  Procedure Is Coronary Intervention of the Mid RCA with a Xience Alpine DES 3.5 mm x 38 mm (postdilated distal  3.6 mm ->3.8 mm-> 4.0 mm proximal)  PROCEDURE: The patient was brought to the 2nd [facility] in the fasting state and prepped and draped in the usual sterile fashion for Right Radial artery  access. A modified Allen's test was performed on the right wrist  demonstrating excellent collateral flow for radial access.   Sterile technique was used including antiseptics, cap, gloves, gown, hand hygiene, mask and sheet. Skin prep: Chlorhexidine.   Consent: Risks of procedure as well as the alternatives and risks of each were explained to the (patient/caregiver). Consent for procedure obtained.   Time Out: Verified patient identification, verified procedure, site/side was marked, verified  correct patient position, special equipment/implants available, medications/allergies/relevent history reviewed, required imaging and test results available. Performed.  Access:   Right Radial  Artery: 6  Fr Sheath -  Seldinger Technique (Angiocath Micropuncture Kit)  Radial Cocktail - 10 mL; IV Heparin 5000 units   Left Heart Catheterization: 5 Fr Catheters advanced  over a Versicore wire and exchanged over a long exchange safety J-wire under direct fluoroscopic guidance; TIG 4.0 catheter advanced first.  Left & Right Coronary Artery Cineangiography: TIG 4.0 Catheter   LV Hemodynamics (LV Gram): Angled Pigtail Catheter  Sheath removed icardiac Cath Lab with TR band placement for hemostasis.  TR Band:  1745 hours, 13 mL air  FINDINGS:  Hemodynamics:   Central Aortic Pressure / Mean: 125/68/92  mmHg  Left Ventricular Pressure / LVEDP: 128/4/11  mmHg  Left Ventriculography:  EF: 60-65 %  Wall Motion: normal   Coronary Anatomy:  Dominance: right  Left Main: large-caliber vessel that bifurcates distally into the LAD and Circumflex. Angiographically normal.  LAD: large-caliber vessel that courses to the apex but not around. It gives rise to 2 diagonal branches. There is a focal 30-40% stenosis at the takeoff of D2 and a large septal perforator. Distally the vessel tapers and a small vessel down to the apex.  D1: small-caliber vessel with mild luminal irregularities.  D2: Moderate caliber, major diagonal branch. Minimal luminal irregularities.  Left Circumflex: Normal caliber vessel that bifurcates essentially into OM1 and the AV groove circumflex was terminates as OM 2. There is a small follow-on AV groove branch that comes off of OM 2.   OM1:  moderate caliber vessel with ostial/proximal 40-50% stenosis.  OM 2: Small moderate caliber vessel with a persistent  small caliber distal vessel. Minimal luminal irregularities.    RCA:  large-caliber, dominant vessel with mild  proximal disease and then tapers to a eccentric, hazy 99% subtotal occlusion in the mid vessel just after a smaller marginal. There are then tandem 50% lesions distal to that. The vessel then has diffuse mild disease until it bifurcates distally into the Right Posterior Descending Artery (RPDA) and the Posterior AV Groove Branch (RPAV).  RPDA: Moderate caliber vessel, ostial 20% stenosis. The vessel reaches two thirds way to the apex. Minimal luminal irregularities beyond the ostial lesion.  RPL Sysytem:The RPAV Begins as a moderate caliber vessel it gives rise to 3 posterolateral branches that are at least small moderate diameter. The follow-on branch before bifurcating into RPL 2 and 3 has mild eccentric 40% stenosis before the bifurcation.   After reviewing the initial angiography, the culprit lesion was thought to be near subtotal occlusion of the mid RCA. This is a long lesion - at least 33 mm.  Preparation were made to proceed with PCI on this lesion.  Percutaneous Coronary Intervention:    Brilinta 180 mg given by mouth   Angiomax bolus and drip initiated    Lesion: 95-99% stenosis of mid RCA with extensive disease on either side TIMI-3 flow   0% post stent; TIMI-3 flow post PCI  Guide: 6 Fr  JR4 Guidewire: BMW  Predilation Balloon #1:  Emerge 2.5  mm x  15  mm;   6  Atm x 30  Sec, 6  Atm x 30  Sec,  Initial attempts to advance stent were unsuccessful; a second larger balloon was chosen for predilation   Predilation Balloon #2:  Euphora  3.0  mm x  15  mm;   8  Atm x 30  Sec,  8 Atm x 30  Sec,  Stent: Xience Alpine DES 3.5  mm x 38  mm;   12  Atm x 30  Sec - final distal diameter 3.6 mm  Post-dilation Balloon: [place] Euphora 4.0 mm x 20 mm;   12 Atm x 30 Sec, -  mid stent  14 Atm 30  Sec -proximal stent   Final Diameter: Tapers from 4.0 mm proximal to 3.8 mm mid and 3.6 mm distal  Post deployment angiography in multiple views, with and without guidewire in place revealed  excellent stent deployment and lesion coverage.  There was no evidence of dissection or perforation.  MEDICATIONS:  Anesthesia:  Local Lidocaine  2 ml  Sedation:  2  mg IV Versed, 75  mcg IV fentanyl ; IV Moprhine 83m x 1  Omnipaque Contrast: 160 ml  Anticoagulation:  IV Heparin 5000 Units ;  Radial Cocktail: 5 mg Verapamil, 400 mcg NTG, 2 ml 2% Lidocaine in 10 ml NS IC NTG 200 mcg x 1 SL NTG 400 mcg x 1  Angiomax Bolus & drip  Anti-Platelet Agent:  Brilinta 180 mg  IV NTG infusion initiated.  PATIENT DISPOSITION:    The patient was transferred to the PACU holding area in a hemodynamicaly stable, chest pain free condition.  The patient tolerated the procedure well, and there were no complications.  EBL:   < 10 ml  The patient was stable before, during, and after the procedure.  The Patient had significant chest pain/angina during balloon inflation and stent inflation. Following PCI she continued to have significant pain that responded to sublingual and intracoronary nitroglycerin as well as IV morphine. Prior to leaving the Cath Lab she was essentially angina  free.  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   Dual Antiplatelet Therapy for minimum one year.  (ok to convert to Brilinta alone @ 3 months if part of trial)  Continue aggressive risk factor modification  Expect discharge in the morning if stable   Aadarsh Cozort, Leonie Green, M.D., M.S. Interventional Cardiologist   Pager # [phone]

## 2014-10-09 NOTE — Interval H&P Note (Signed)
History and Physical Interval Note:  10/09/2014 4:42 PM  Hayley Hogan  has presented today for surgery, with the diagnosis of non stemi  The various methods of treatment have been discussed with the patient and family. After consideration of risks, benefits and other options for treatment, the patient has consented to  Procedure(s): LEFT HEART CATHETERIZATION WITH CORONARY ANGIOGRAM (N/A) +/- PCI as a surgical intervention .  The patient's history has been reviewed, patient examined, no change in status, stable for surgery.  I have reviewed the patient's chart and labs.  Questions were answered to the patient's satisfaction.    Cath Lab Visit (complete for each Cath Lab visit)  Clinical Evaluation Leading to the Procedure:   ACS: Yes.    Non-ACS:    Anginal Classification: CCS III  Anti-ischemic medical therapy: Minimal Therapy (1 class of medications)  Non-Invasive Test Results: Low-risk stress test findings: cardiac mortality <1%/year  Prior CABG: No previous CABG   Lenell Mcconnell W

## 2014-10-09 NOTE — H&P (Signed)
Hospitalist Admission History and Physical  Patient name: Hayley Hogan Medical record number: [id number] Date of birth: 1946/06/11 Age: 69 y.o. Gender: female  Primary Care Provider: Donnajean Lopes, MD  Chief Complaint: chest pain   History of Present Illness:This is a 69 y.o. year old female with significant past medical history of HTN, NIDDM, rheumatoid arthritis, tobacco abuse presenting with chest pain. Pt reports intermittent episodes of chest pain over the past month the patient would have left of center anterior chest pain that radiated down the left arm. Symptoms usually self resolve within 1-2 minutes. Have progressively worsening symptoms over the course of the day today. Symptoms seem to persist over the course of minutes to hours. Intensity worsened to pain being 8 out of 10 at its worse. Morphology change somewhat with pain radiation to involve the left neck as well as left arm. Patient denies any associated nausea or diaphoresis. Chest pain occurs at rest. No alleviating or aggravating factors. Patient is known to have had a normal stress test back in October of last year. Patient states she took 1000 mg of aspirin associated with goody powders today with resolution of chest pain. Symptoms are unrelated to eating per patient. Symptoms had markedly improved by the time of EMS arrival. Presented to the ER afebrile, hemodynamically stable. CBC and be met within normal limits. Glucose 260. Chest x-ray within normal limits. Troponin negative 1. EKG normal sinus rhythm with nonspecific minimal  ST-T wave changes. Per ER physician assistant Lahoma Rocker, case discussed with on-call cardiology recommended medical admission.  Assessment and Plan: Hayley Hogan is a 69 y.o. year old female presenting with chest pain  Active Problems:   Chest pain   1- Chest Pain  -fairly typical symptoms -HEART Score 5-6  -trop and EKG grossly normal  -noted normal nuclear medicine stress test  03/2014 -full dose ASA -cycle CEs -risk stratification labs -tele bed  -cards c/s in am   2- NIDDM -SSI -A1C -hold orals  3- HTN -BP stable -cont home regimen   4-RA -stable  -cont home regimen   5- HLD -check lipid panel  -cont statin   FEN/GI: heart healthy-carb modified diet  Prophylaxis: sub q heparin  Disposition: pending further evaluation  Code Status:Full Code    Patient Active Problem List   Diagnosis Date Noted  . PAD (peripheral artery disease) 04/27/2014  . Chest pain 03/15/2014  . Elevated coronary artery calcium score 03/15/2014  . Lung nodule 05/11/2013  . Osteopenia 05/11/2013  . Atherosclerosis of aorta 05/11/2013  . Cirrhosis of liver without mention of alcohol 05/11/2013  . Thrombocytopenia, unspecified 05/11/2013  . Benign neoplasm of colon 05/11/2013  . Lower GI bleed 05/09/2013  . DM2 (diabetes mellitus, type 2) 05/09/2013  . Essential hypertension, benign 05/09/2013  . Acute lower GI bleeding 05/09/2013  . Chronic cholecystitis 02/18/2012  . Rheumatoid arthritis 01/07/2012  . Headache(784.0) 10/01/2011    Class: Acute  . Nausea and vomiting 10/01/2011    Class: Acute  . Hypertension 10/01/2011    Class: Acute   Past Medical History: Past Medical History  Diagnosis Date  . Hypertension   . Neuromuscular disorder   . Migraines     "when I was very young; before 1968"  . Rheumatoid arthritis(714.0)   . Transverse myelitis 1979    w/transient inability to walk  . DM type 2 (diabetes mellitus, type 2)   . Fatty liver disease, nonalcoholic     Past Surgical History: Past Surgical History  Procedure Laterality  Date  . Vaginal hysterectomy  1982  . Orif ankle fracture  09/2008    right  . Breast lumpectomy  1970    right  . Fracture surgery      plates and screws in right ankle  . Tubal ligation    . Colonoscopy N/A 05/11/2013    Procedure: COLONOSCOPY;  Surgeon: Ladene Artist, MD;  Location[facility] END[facility]COPY;  Service:  Endoscopy;  Laterality: N/A;    Social History: History   Social History  . Marital Status: Married    Spouse Name: N/A  . Number of Children: N/A  . Years of Education: N/A   Social History Main Topics  . Smoking status: Current Some Day Smoker -- 0.20 packs/day for 35 years    Types: Cigarettes    Last Attempt to Quit: 01/14/2011  . Smokeless tobacco: Never Used  . Alcohol Use: 0.6 oz/week    1 Glasses of wine per week  . Drug Use: Yes    Special: Marijuana     Comment: "marijuana in the 1960's"  . Sexual Activity: No   Other Topics Concern  . None   Social History Narrative    Family History: Family History  Problem Relation Age of Onset  . Hypertension Mother   . Cancer Father   . Heart Problems Maternal Grandfather   . Cancer Brother   . Cancer Brother   . Other Brother     GSW  . Stroke Sister     x2  . Hypertension Sister     x2  . COPD Sister     Allergies: Allergies  Allergen Reactions  . Demerol Other (See Comments)    vomiting  . Peanut-Containing Drug Products     Mouth swelling    Current Facility-Administered Medications  Medication Dose Route Frequency Provider Last Rate Last Dose  . acetaminophen (TYLENOL) tablet 650 mg  650 mg Oral Q4H PRN Deneise Lever, MD      . aspirin tablet 81 mg  81 mg Oral Daily Deneise Lever, MD      . heparin injection 5,000 Units  5,000 Units Subcutaneous 3 times per day Deneise Lever, MD      . hydroxychloroquine (PLAQUENIL) tablet 200 mg  200 mg Oral BID Deneise Lever, MD      . insulin aspart (novoLOG) injection 0-9 Units  0-9 Units Subcutaneous 6 times per day Deneise Lever, MD      . losartan (COZAAR) tablet 100 mg  100 mg Oral Daily Deneise Lever, MD      . methotrexate (RHEUMATREX) tablet 12.5 mg  12.5 mg Oral Weekly Deneise Lever, MD      . metoprolol tartrate (LOPRESSOR) tablet 100 mg  100 mg Oral BID Deneise Lever, MD      . morphine 2 MG/ML injection 2 mg  2 mg Intravenous Q2H PRN  Deneise Lever, MD      . ondansetron [facility]) injection 4 mg  4 mg Intravenous Q6H PRN Deneise Lever, MD      . pravastatin (PRAVACHOL) tablet 20 mg  20 mg Oral Daily Deneise Lever, MD       Current Outpatient Prescriptions  Medication Sig Dispense Refill  . aspirin 81 MG tablet Take 81 mg by mouth daily.    . folic acid (FOLVITE) 638 MCG tablet Take 800 mcg by mouth daily.    . hydroxychloroquine (PLAQUENIL) 200 MG tablet Take 200 mg by mouth  2 (two) times daily.    Marland Kitchen losartan (COZAAR) 100 MG tablet Take 100 mg by mouth daily.     . metFORMIN (GLUCOPHAGE) 500 MG tablet Take 500 mg by mouth every evening.    . methotrexate (RHEUMATREX) 2.5 MG tablet Take 12.5 mg by mouth once a week. Wednesday    . metoprolol (LOPRESSOR) 100 MG tablet Take 100 mg by mouth 2 (two) times daily.    . Multiple Vitamin (MULITIVITAMIN WITH MINERALS) TABS Take 1 tablet by mouth daily.    . pravastatin (PRAVACHOL) 20 MG tablet Take 1 tablet by mouth daily.    . traMADol (ULTRAM) 50 MG tablet Take 50 mg by mouth every 6 (six) hours as needed for moderate pain.     . Vitamin D, Ergocalciferol, (DRISDOL) 50000 UNITS CAPS capsule Take 50,000 Units by mouth every 7 (seven) days. Wednesday    . varenicline (CHANTIX PAK) 0.5 MG X 11 & 1 MG X 42 tablet Take one 0.5 mg tablet by mouth once daily for 3 days, then increase to one 0.5 mg tablet twice daily for 4 days, then increase to one 1 mg tablet twice daily. (Patient not taking: Reported on 10/09/2014) 53 tablet 0   Review Of Systems: 12 point ROS negative except as noted above in HPI.  Physical Exam: Filed Vitals:   10/08/14 2230  BP: 133/78  Pulse: 69  Temp:   Resp: 17    General: alert, cooperative and mildly obese HEENT: PERRLA and extra ocular movement intact Heart: S1, S2 normal, no murmur, rub or gallop, regular rate and rhythm Lungs: clear to auscultation, no wheezes or rales and unlabored breathing Abdomen: abdomen is soft without significant  tenderness, masses, organomegaly or guarding Extremities: extremities normal, atraumatic, no cyanosis or edema Skin:no rashes Neurology: normal without focal findings  Labs and Imaging: Lab Results  Component Value Date/Time   NA 138 10/08/2014 10:36 PM   K 3.8 10/08/2014 10:36 PM   CL 102 10/08/2014 10:36 PM   CO2 25 10/08/2014 10:36 PM   BUN 10 10/08/2014 10:36 PM   CREATININE 1.01 10/08/2014 10:36 PM   GLUCOSE 260* 10/08/2014 10:36 PM   Lab Results  Component Value Date   WBC 7.4 10/08/2014   HGB 13.8 10/08/2014   HCT 41.0 10/08/2014   MCV 91.1 10/08/2014   PLT 110* 10/08/2014    Dg Chest 2 View  10/08/2014   CLINICAL DATA:  Acute onset of left-sided chest pain, radiating to the left arm and jaw. Initial encounter.  EXAM: CHEST  2 VIEW  COMPARISON:  Chest radiograph performed 04/24/2014  FINDINGS: The lungs are well-aerated and clear. There is no evidence of focal opacification, pleural effusion or pneumothorax.  The heart is normal in size; the mediastinal contour is within normal limits. No acute osseous abnormalities are seen.  IMPRESSION: No acute cardiopulmonary process seen.   Electronically Signed   By: Garald Balding M.D.   On: 10/08/2014 23:14           Shanda Howells MD  Pager: [phone]

## 2014-10-09 NOTE — ED Notes (Signed)
Pt provided with Ginger Ale, okay'd by Dr. Eulis Foster

## 2014-10-09 NOTE — Progress Notes (Signed)
Troponin 0.07 patient alert and oriented x4 and denies chest pain, paged on call floor coverage will continue to monitor patient.

## 2014-10-09 NOTE — Progress Notes (Signed)
Pt watched the pre-cath video

## 2014-10-09 NOTE — Progress Notes (Signed)
TR BAND REMOVAL  LOCATION:    right radial  DEFLATED PER PROTOCOL:    Yes.    TIME BAND OFF / DRESSING APPLIED:    22:15   SITE UPON ARRIVAL:    Level 1  SITE AFTER BAND REMOVAL:    Level 1  REVERSE ALLEN'S TEST:     positive  CIRCULATION SENSATION AND MOVEMENT:    Within Normal Limits   Yes.    COMMENTS:   Pt tolerated removal of TR band without complication, will continue to monitor patient.

## 2014-10-10 ENCOUNTER — Telehealth: Payer: Self-pay | Admitting: Internal Medicine

## 2014-10-10 ENCOUNTER — Encounter (HOSPITAL_COMMUNITY): Payer: Self-pay | Admitting: Cardiology

## 2014-10-10 DIAGNOSIS — E119 Type 2 diabetes mellitus without complications: Secondary | ICD-10-CM

## 2014-10-10 DIAGNOSIS — I249 Acute ischemic heart disease, unspecified: Secondary | ICD-10-CM

## 2014-10-10 DIAGNOSIS — I499 Cardiac arrhythmia, unspecified: Secondary | ICD-10-CM

## 2014-10-10 DIAGNOSIS — I214 Non-ST elevation (NSTEMI) myocardial infarction: Principal | ICD-10-CM

## 2014-10-10 DIAGNOSIS — Z955 Presence of coronary angioplasty implant and graft: Secondary | ICD-10-CM

## 2014-10-10 DIAGNOSIS — I10 Essential (primary) hypertension: Secondary | ICD-10-CM

## 2014-10-10 LAB — COMPREHENSIVE METABOLIC PANEL
ALT: 27 U/L (ref 0–35)
AST: 20 U/L (ref 0–37)
Albumin: 3.3 g/dL — ABNORMAL LOW (ref 3.5–5.2)
Alkaline Phosphatase: 66 U/L (ref 39–117)
Anion gap: 10 (ref 5–15)
BUN: 6 mg/dL (ref 6–23)
CHLORIDE: 104 mmol/L (ref 96–112)
CO2: 24 mmol/L (ref 19–32)
Calcium: 8.5 mg/dL (ref 8.4–10.5)
Creatinine, Ser: 0.68 mg/dL (ref 0.50–1.10)
GFR calc Af Amer: >90 mL/min (ref >90)
GFR, EST NON AFRICAN AMERICAN: 89 mL/min — AB (ref >90)
Glucose, Bld: 128 mg/dL — ABNORMAL HIGH (ref 70–99)
Potassium: 3.6 mmol/L (ref 3.5–5.1)
Sodium: 138 mmol/L (ref 135–145)
Total Bilirubin: 0.4 mg/dL (ref 0.3–1.2)
Total Protein: 6.1 g/dL (ref 6.0–8.3)

## 2014-10-10 LAB — HEMOGLOBIN A1C
Hgb A1c MFr Bld: 7.7 % — ABNORMAL HIGH (ref 4.8–5.6)
Mean Plasma Glucose: 174 mg/dL

## 2014-10-10 LAB — CBC
HEMATOCRIT: 41.1 % (ref 36.0–46.0)
Hemoglobin: 13.6 g/dL (ref 12.0–15.0)
MCH: 29.8 pg (ref 26.0–34.0)
MCHC: 33.1 g/dL (ref 30.0–36.0)
MCV: 90.1 fL (ref 78.0–100.0)
Platelets: 104 10*3/uL — ABNORMAL LOW (ref 150–400)
RBC: 4.56 MIL/uL (ref 3.87–5.11)
RDW: 13.2 % (ref 11.5–15.5)
WBC: 7.7 10*3/uL (ref 4.0–10.5)

## 2014-10-10 LAB — MAGNESIUM: Magnesium: 2.1 mg/dL (ref 1.5–2.5)

## 2014-10-10 LAB — GLUCOSE, CAPILLARY: GLUCOSE-CAPILLARY: 141 mg/dL — AB (ref 70–99)

## 2014-10-10 MED ORDER — NYSTATIN 100000 UNIT/ML MT SUSP
5.0000 mL | Freq: Four times a day (QID) | OROMUCOSAL | Status: DC
  Administered 2014-10-10: 500000 [IU] via OROMUCOSAL
  Filled 2014-10-10 (×4): qty 5

## 2014-10-10 MED ORDER — METFORMIN HCL 500 MG PO TABS: 500.0000 mg | ORAL_TABLET | Freq: Every evening | ORAL | Status: DC

## 2014-10-10 MED ORDER — TICAGRELOR 90 MG PO TABS: 90.0000 mg | ORAL_TABLET | Freq: Two times a day (BID) | ORAL | Status: DC

## 2014-10-10 MED ORDER — ASPIRIN 81 MG PO TABS: 81.0000 mg | ORAL_TABLET | Freq: Every day | ORAL | Status: DC

## 2014-10-10 MED ORDER — NYSTATIN 100000 UNIT/ML MT SUSP: 5.0000 mL | Freq: Four times a day (QID) | OROMUCOSAL | Status: AC

## 2014-10-10 MED FILL — Sodium Chloride IV Soln 0.9%: INTRAVENOUS | Qty: 50 | Status: AC

## 2014-10-10 NOTE — Discharge Instructions (Signed)
Follow with Primary MD Hayley Lopes, MD in 7 days   Get CBC, CMP, 2 view Chest X ray checked  by Primary MD next visit.    Activity: As tolerated with Full fall precautions use walker/cane & assistance as needed   Disposition Home     Diet: Heart Healthy  Low Carb  For Heart failure patients - Check your Weight same time everyday, if you gain over 2 pounds, or you develop in leg swelling, experience more shortness of breath or chest pain, call your Primary MD immediately. Follow Cardiac Low Salt Diet and 1.5 lit/day fluid restriction.   On your next visit with your primary care physician please Get Medicines reviewed and adjusted.   Please request your Prim.MD to go over all Hospital Tests and Procedure/Radiological results at the follow up, please get all Hospital records sent to your Prim MD by signing hospital release before you go home.   If you experience worsening of your admission symptoms, develop shortness of breath, life threatening emergency, suicidal or homicidal thoughts you must seek medical attention immediately by calling 911 or calling your MD immediately  if symptoms less severe.  You Must read complete instructions/literature along with all the possible adverse reactions/side effects for all the Medicines you take and that have been prescribed to you. Take any new Medicines after you have completely understood and accpet all the possible adverse reactions/side effects.   Do not drive, operating heavy machinery, perform activities at heights, swimming or participation in water activities or provide baby sitting services if your were admitted for syncope or siezures until you have seen by Primary MD or a Neurologist and advised to do so again.  Do not drive when taking Pain medications.    Do not take more than prescribed Pain, Sleep and Anxiety Medications  Special Instructions: If you have smoked or chewed Tobacco  in the last 2 yrs please stop smoking, stop  any regular Alcohol  and or any Recreational drug use.  Wear Seat belts while driving.   Please note  You were cared for by a hospitalist during your hospital stay. If you have any questions about your discharge medications or the care you received while you were in the hospital after you are discharged, you can call the unit and asked to speak with the hospitalist on call if the hospitalist that took care of you is not available. Once you are discharged, your primary care physician will handle any further medical issues. Please note that NO REFILLS for any discharge medications will be authorized once you are discharged, as it is imperative that you return to your primary care physician (or establish a relationship with a primary care physician if you do not have one) for your aftercare needs so that they can reassess your need for medications and monitor your lab values.

## 2014-10-10 NOTE — Care Management Note (Unsigned)
    Page 1 of 1   10/10/2014     9:22:47 AM CARE MANAGEMENT NOTE 10/10/2014  Patient:  Hayley Hogan, Hayley Hogan   Account Number:  [id number]  Date Initiated:  10/10/2014  Documentation initiated by:  Whitman Hero  Subjective/Objective Assessment:   PTA from home admiited with CP.     Action/Plan:   Return to home when medically stable. CM to f/u with d/c needs.   Anticipated DC Date:  10/10/2014   Anticipated DC Plan:        DC Planning Services  CM consult      Choice offered to / List presented to:             Status of service:   Medicare Important Message given?   (If response is "NO", the following Medicare IM given date fields will be blank) Date Medicare IM given:   Medicare IM given by:   Date Additional Medicare IM given:   Additional Medicare IM given by:    Discharge Disposition:    Per UR Regulation:    If discussed at Long Length of Stay Meetings, dates discussed:    Comments:  10/10/2014 @ [place] RN,BSN,CM CM spoke with pt regarding Brilinta, pamphlet given with 30 day free card and copay card. Pt states uses [facility] in [place], [place], [phone]. CM called pharmacy to confirm medication in stock, pharmacy stated med. will be instock on tomorrow @ 4pm. CM made pt and nurse  aware, nurse to help with medication coverage for today's pm and tomorrow's am dose. CM will f/u with pt with benefit check involving copay, and prior authorization if needed. No other needs identified  @  the present time.

## 2014-10-10 NOTE — Progress Notes (Signed)
Pt experienced 11 beats of V-Tach, pt denied Chest Pain, SOB, pt in no distress, no complaints, Vital signs stable as charted, Dr. Colon Flattery aware, new orders received, will continue to monitor patient.

## 2014-10-10 NOTE — Progress Notes (Addendum)
    Subjective: No CP or SOB  Objective: Vital signs in last 24 hours: Temp:  [97.6 F (36.4 C)-98.7 F (37.1 C)] 98.7 F (37.1 C) (04/27 0759) Pulse Rate:  [56-65] 64 (04/27 0759) Resp:  [11-18] 18 (04/27 0759) BP: (115-147)/(67-88) 138/67 mmHg (04/27 0759) SpO2:  [93 %-99 %] 96 % (04/27 0759) Weight:  [167 lb 8.8 oz (76 kg)-171 lb 15.3 oz (78 kg)] 167 lb 8.8 oz (76 kg) (04/27 0005) Last BM Date: 10/09/14  Intake/Output from previous day: 04/26 0701 - 04/27 0700 In: 58.5 [I.V.:58.5] Out: 1700 [Urine:1700] Intake/Output this shift: Total I/O In: 360 [P.O.:360] Out: -   Medications Scheduled Meds: . aspirin  81 mg Oral Daily  . heparin  5,000 Units Subcutaneous 3 times per day  . hydroxychloroquine  200 mg Oral BID  . insulin aspart  0-9 Units Subcutaneous TID WC  . losartan  100 mg Oral Daily  . methotrexate  12.5 mg Oral Q Wed  . metoprolol  100 mg Oral BID  . pravastatin  20 mg Oral Daily  . ticagrelor  90 mg Oral BID   Continuous Infusions: . nitroGLYCERIN Stopped (10/10/14 0000)   PRN Meds:.acetaminophen, morphine injection, ondansetron (ZOFRAN) IV  PE: General appearance: alert, cooperative and no distress Heart: regular rate and rhythm, S1, S2 normal, no murmur, click, rub or gallop Extremities: No LEE Pulses: 2+ and symmetric Skin: Warm and dry.  Moderate area of ecchymosis and mild tenderness in the right wrist cath site.  Neurologic: Grossly normal  Lab Results:   Recent Labs  10/08/14 2236 10/09/14 0217 10/10/14 0337  WBC 7.4 7.4 7.7  HGB 13.8 13.7 13.6  HCT 41.0 41.2 41.1  PLT 110* 108* 104*   BMET  Recent Labs  10/08/14 2236 10/09/14 0217 10/10/14 0337  NA 138 141 138  K 3.8 3.9 3.6  CL 102 105 104  CO2 25 26 24   GLUCOSE 260* 143* 128*  BUN 10 10 6   CREATININE 1.01 0.82 0.68  CALCIUM 8.4 8.7 8.5   PT/INR  Recent Labs  10/09/14 1100  LABPROT 13.2  INR 0.99   Cholesterol  Recent Labs  10/09/14 0217  CHOL 143      Assessment/Plan   Principal Problem:   Chest pain Active Problems:   DM2 (diabetes mellitus, type 2)   Essential hypertension, benign   Atherosclerosis of aorta   PAD (peripheral artery disease)   Pain in the chest   Acute coronary syndrome  SP left heart cath via radial approach revealing severe single-vessel CAD involving the entire mid RCA treated successfully with a Drug-Eluting Stent.  Moderate disease of the Circumflex-OM1 and distal RCA.  Preserved LVEF with normal LVEDP.  ASA, brilinta.  Short ~8 beat accelerated idioventricular rhythm.  On lopressor 100mg  BID.  BP stable.  DC home today.     LOS: 1 day    HAGER, BRYAN PA-C 10/10/2014 8:16 AM  Personally seen and examined. Agree with above. NSTEMI - ECG changes (TWI precordial), mildly + Trop, chest pain. Mid RCA 99% lesion. DES. AIVR on tele.  No symptoms Feels well and would like to go home RCA DES - discussed DAPT Normal EF .  Meds reviewed.  Minor bruising right wrist.   1 week follow up.  Candee Furbish, MD

## 2014-10-10 NOTE — Progress Notes (Signed)
CARDIAC REHAB PHASE I   PRE:  Rate/Rhythm: 34 SR  BP:  Supine: 138/67  Sitting:   Standing:    SaO2:   MODE:  Ambulation: 600 ft   POST:  Rate/Rhythm: 84 SR  BP:  Supine:   Sitting: 129/76  Standing:    SaO2:  0750-0857 Pt walked 600 ft with steady gait. No CP. MI education completed with pt who voiced understanding. Stressed importance of brilinta with stent. Case manager to see re brilitna. Discussed CRP 2 and pt gave permission for referral to [facility] Phase 2. Pt stated she was familiar with carb counting. Gave heart healthy and diabetic handouts for her to read. Discussed smoking cessation. Pt to quit cold [place] but if she needs chantix which worked for her in past, she will discuss with cardiologist. Gave smoking cessation handouts.    Graylon Good, RN BSN  10/10/2014 8:52 AM

## 2014-10-10 NOTE — Telephone Encounter (Signed)
Pt. Wants to know when she can go back to work

## 2014-10-10 NOTE — Telephone Encounter (Signed)
Pt called in wanting to know when she will be able to go back to work , since she just have her Catheterization. Please call  Thanks

## 2014-10-10 NOTE — Discharge Summary (Signed)
Hayley Hogan, is a 69 y.o. female  DOB [birth year]  MRN [id number].  Admission date:  10/08/2014  Admitting Physician  Deneise Lever, MD  Discharge Date:  10/10/2014   Primary MD  Donnajean Lopes, MD  Recommendations for primary care physician for things to follow:   Monitor secondary to his factors for stroke   Admission Diagnosis  Chest pain, unspecified chest pain type [R07.9]   Discharge Diagnosis  Chest pain, unspecified chest pain type [R07.9]    Principal Problem:   Chest pain Active Problems:   DM2 (diabetes mellitus, type 2)   Essential hypertension, benign   Atherosclerosis of aorta   PAD (peripheral artery disease)   Pain in the chest   Acute coronary syndrome   Stented coronary artery      Past Medical History  Diagnosis Date  . Hypertension   . Neuromuscular disorder   . Migraines     "when I was very young; before 1968"  . Rheumatoid arthritis(714.0)   . Transverse myelitis 1979    w/transient inability to walk  . DM type 2 (diabetes mellitus, type 2)   . Fatty liver disease, nonalcoholic     Past Surgical History  Procedure Laterality Date  . Vaginal hysterectomy  1982  . Orif ankle fracture  09/2008    right  . Breast lumpectomy  1970    right  . Fracture surgery      plates and screws in right ankle  . Tubal ligation    . Colonoscopy N/A 05/11/2013    Procedure: COLONOSCOPY;  Surgeon: Ladene Artist, MD;  Location: [facility] ENDOSCOPY;  Service: Endoscopy;  Laterality: N/A;  . Left heart catheterization with coronary angiogram N/A 10/09/2014    Procedure: LEFT HEART CATHETERIZATION WITH CORONARY ANGIOGRAM;  Surgeon: Leonie Man, MD;  Location: [facility] CATH LAB;  Service: Cardiovascular;  Laterality: N/A;       History of present illness and  Hospital Course:     Kindly  see H&P for history of present illness and admission details, please review complete Labs, Consult reports and Test reports for all details in brief  HPI  from the history and physical done on the day of admission  This is a 69 y.o. year old female with significant past medical history of HTN, NIDDM, rheumatoid arthritis, tobacco abuse presenting with chest pain. Pt reports intermittent episodes of chest pain over the past month the patient would have left of center anterior chest pain that radiated down the left arm. Symptoms usually self resolve within 1-2 minutes. Have progressively worsening symptoms over the course of the day today. Symptoms seem to persist over the course of minutes to hours. Intensity worsened to pain being 8 out of 10 at its worse. Morphology change somewhat with pain radiation to involve the left neck as well as left arm. Patient denies any associated nausea or diaphoresis. Chest pain occurs at rest. No alleviating or aggravating factors. Patient is known to have had a normal stress test back in October  of last year. Patient states she took 1000 mg of aspirin associated with goody powders today with resolution of chest pain. Symptoms are unrelated to eating per patient. Symptoms had markedly improved by the time of EMS arrival. Presented to the ER afebrile, hemodynamically stable. CBC and be met within normal limits. Glucose 260. Chest x-ray within normal limits. Troponin negative 1. EKG normal sinus rhythm with nonspecific minimal ST-T wave changes. Per ER physician assistant Lahoma Rocker, case discussed with on-call cardiology recommended medical admission.  Hospital Course    1. [place] With NSTEMI - cardiology on board, underwent left heart cath requiring drug-eluting stent to RCA on 10/09/2014, will be placed on dual antiplatelet therapy which will include brilinta and  aspirin along with   beta blocker and statin for secondary prevention. Does have history of PAD as well. She is  currently chest pain-free and will be started home with outpatient follow-up with cardiologist. Cleared by cardiology for discharge.   2. Smoking. Counseled to quit   3. Essential hypertension. On ARB and beta blocker continue to monitor.   4. DM type II. Hold metformin for 2 days then resume. [facility] PCP to monitor glycemic control closely.  Lab Results  Component Value Date   HGBA1C 7.7* 10/09/2014      Discharge Condition: Stable   Follow UP  Follow-up Information    Follow up with [facility] R, NP On 10/30/2014.   Specialty:  Cardiology   Why:  8:30 AM   Contact information:   [place] [address] [phone]       Follow up with Donnajean Lopes, MD. Schedule an appointment as soon as possible for a visit in 1 week.   Specialty:  Internal Medicine   Contact information:   [address] [place] [address] [phone]         Discharge Instructions  and  Discharge Medications      Discharge Instructions    Amb Referral to Cardiac Rehabilitation    Complete by:  As directed      Discharge instructions    Complete by:  As directed   Follow with Primary MD Donnajean Lopes, MD in 7 days   Get CBC, CMP, 2 view Chest X ray checked  by Primary MD next visit.    Activity: As tolerated with Full fall precautions use walker/cane & assistance as needed   Disposition Home     Diet: Heart Healthy  Low Carb  For Heart failure patients - Check your Weight same time everyday, if you gain over 2 pounds, or you develop in leg swelling, experience more shortness of breath or chest pain, call your Primary MD immediately. Follow Cardiac Low Salt Diet and 1.5 lit/day fluid restriction.   On your next visit with your primary care physician please Get Medicines reviewed and adjusted.   Please request your Prim.MD to go over all Hospital Tests and Procedure/Radiological results at the follow up, please get all Hospital records sent  to your Prim MD by signing hospital release before you go home.   If you experience worsening of your admission symptoms, develop shortness of breath, life threatening emergency, suicidal or homicidal thoughts you must seek medical attention immediately by calling 911 or calling your MD immediately  if symptoms less severe.  You Must read complete instructions/literature along with all the possible adverse reactions/side effects for all the Medicines you take and that have been prescribed to you. Take any new Medicines after you have completely  understood and accpet all the possible adverse reactions/side effects.   Do not drive, operating heavy machinery, perform activities at heights, swimming or participation in water activities or provide baby sitting services if your were admitted for syncope or siezures until you have seen by Primary MD or a Neurologist and advised to do so again.  Do not drive when taking Pain medications.    Do not take more than prescribed Pain, Sleep and Anxiety Medications  Special Instructions: If you have smoked or chewed Tobacco  in the last 2 yrs please stop smoking, stop any regular Alcohol  and or any Recreational drug use.  Wear Seat belts while driving.   Please note  You were cared for by a hospitalist during your hospital stay. If you have any questions about your discharge medications or the care you received while you were in the hospital after you are discharged, you can call the unit and asked to speak with the hospitalist on call if the hospitalist that took care of you is not available. Once you are discharged, your primary care physician will handle any further medical issues. Please note that NO REFILLS for any discharge medications will be authorized once you are discharged, as it is imperative that you return to your primary care physician (or establish a relationship with a primary care physician if you do not have one) for your aftercare needs so  that they can reassess your need for medications and monitor your lab values.     Increase activity slowly    Complete by:  As directed             Medication List    TAKE these medications        aspirin 81 MG tablet  Take 1 tablet (81 mg total) by mouth daily.     folic acid 845 MCG tablet  Commonly known as:  FOLVITE  Take 800 mcg by mouth daily.     hydroxychloroquine 200 MG tablet  Commonly known as:  PLAQUENIL  Take 200 mg by mouth 2 (two) times daily.     losartan 100 MG tablet  Commonly known as:  COZAAR  Take 100 mg by mouth daily.     metFORMIN 500 MG tablet  Commonly known as:  GLUCOPHAGE  Take 1 tablet (500 mg total) by mouth every evening.  Start taking on:  10/12/2014     methotrexate 2.5 MG tablet  Commonly known as:  RHEUMATREX  Take 12.5 mg by mouth once a week. Wednesday     metoprolol 100 MG tablet  Commonly known as:  LOPRESSOR  Take 100 mg by mouth 2 (two) times daily.     multivitamin with minerals Tabs tablet  Take 1 tablet by mouth daily.     nystatin 100000 UNIT/ML suspension  Commonly known as:  MYCOSTATIN  Use as directed 5 mLs (500,000 Units total) in the mouth or throat 4 (four) times daily.     pravastatin 20 MG tablet  Commonly known as:  PRAVACHOL  Take 1 tablet by mouth daily.     ticagrelor 90 MG Tabs tablet  Commonly known as:  BRILINTA  Take 1 tablet (90 mg total) by mouth 2 (two) times daily.     traMADol 50 MG tablet  Commonly known as:  ULTRAM  Take 50 mg by mouth every 6 (six) hours as needed for moderate pain.     varenicline 0.5 MG X 11 & 1 MG X 42 tablet  Commonly  known as:  CHANTIX PAK  Take one 0.5 mg tablet by mouth once daily for 3 days, then increase to one 0.5 mg tablet twice daily for 4 days, then increase to one 1 mg tablet twice daily.     Vitamin D (Ergocalciferol) 50000 UNITS Caps capsule  Commonly known as:  DRISDOL  Take 50,000 Units by mouth every 7 (seven) days. Wednesday          Diet and  Activity recommendation: See Discharge Instructions above   Consults obtained - Cards   Major procedures and Radiology Reports - PLEASE review detailed and final reports for all details, in brief -    L heart Cath  POST-OPERATIVE DIAGNOSIS:  Severe single-vessel CAD involving the entire mid RCA treated successfully with a Drug-Eluting Stent  Moderate disease of the Circumflex-OM1 and distal R{AV  Preserved LVEF with normal LVEDP   PLAN OF CARE:  Standard post radial PCI care with TR band removal.   Dual Antiplatelet Therapy for minimum one year. (ok to convert to Brilinta alone @ 3 months if part of trial)  Continue aggressive risk factor modification  Expect discharge in the morning if stable   HARDING, Leonie Green, M.D., M.S. Interventional Cardiologist    Dg Chest 2 View  10/08/2014   CLINICAL DATA:  Acute onset of left-sided chest pain, radiating to the left arm and jaw. Initial encounter.  EXAM: CHEST  2 VIEW  COMPARISON:  Chest radiograph performed 04/24/2014  FINDINGS: The lungs are well-aerated and clear. There is no evidence of focal opacification, pleural effusion or pneumothorax.  The heart is normal in size; the mediastinal contour is within normal limits. No acute osseous abnormalities are seen.  IMPRESSION: No acute cardiopulmonary process seen.   Electronically Signed   By: Garald Balding M.D.   On: 10/08/2014 23:14    Micro Results      No results found for this or any previous visit (from the past 240 hour(s)).     Today   Subjective:   Hayley Hogan today has no headache,no chest abdominal pain,no new weakness tingling or numbness, feels much better wants to go home today.    Objective:   Blood pressure 138/67, pulse 64, temperature 98.7 F (37.1 C), temperature source Oral, resp. rate 18, height 5' 7.5" (1.715 m), weight 76 kg (167 lb 8.8 oz), SpO2 96 %.   Intake/Output Summary (Last 24 hours) at 10/10/14 0951 Last data filed at 10/10/14  0946  Gross per 24 hour  Intake 418.45 ml  Output   2700 ml  Net -2281.55 ml    Exam Awake Alert, Oriented x 3, No new F.N deficits, Normal affect [place].AT,PERRAL Supple Neck,No JVD, No cervical lymphadenopathy appriciated.  Symmetrical Chest wall movement, Good air movement bilaterally, CTAB RRR,No Gallops,Rubs or new Murmurs, No Parasternal Heave +ve B.Sounds, Abd Soft, Non tender, No organomegaly appriciated, No rebound -guarding or rigidity. No Cyanosis, Clubbing or edema, No new Rash or bruise  Data Review   CBC w Diff: Lab Results  Component Value Date   WBC 7.7 10/10/2014   HGB 13.6 10/10/2014   HCT 41.1 10/10/2014   PLT 104* 10/10/2014   LYMPHOPCT 22 10/08/2014   MONOPCT 8 10/08/2014   EOSPCT 2 10/08/2014   BASOPCT 0 10/08/2014    CMP: Lab Results  Component Value Date   NA 138 10/10/2014   K 3.6 10/10/2014   CL 104 10/10/2014   CO2 24 10/10/2014   BUN 6 10/10/2014   CREATININE  0.68 10/10/2014   PROT 6.1 10/10/2014   ALBUMIN 3.3* 10/10/2014   BILITOT 0.4 10/10/2014   ALKPHOS 66 10/10/2014   AST 20 10/10/2014   ALT 27 10/10/2014  .   Total Time in preparing paper work, data evaluation and todays exam - 35 minutes  Thurnell Lose M.D on 10/10/2014 at 9:51 AM  [facility]   Office  [phone]

## 2014-10-11 ENCOUNTER — Telehealth: Payer: Self-pay | Admitting: Internal Medicine

## 2014-10-11 NOTE — Telephone Encounter (Signed)
Pt says she needs a letter for work.

## 2014-10-11 NOTE — Telephone Encounter (Signed)
Staff message sent to [place] for work note

## 2014-10-11 NOTE — Telephone Encounter (Signed)
Usually after her follow-up .Marland Kitchen That is 5/17 with Mickel Baas.  Dr. Lemmie Evens

## 2014-10-11 NOTE — Telephone Encounter (Signed)
Pt. Informed that it would be after her fu visit on 5/17 before she could be released to go back to work

## 2014-10-12 ENCOUNTER — Encounter: Payer: Self-pay | Admitting: *Deleted

## 2014-10-12 ENCOUNTER — Telehealth: Payer: Self-pay | Admitting: *Deleted

## 2014-10-12 NOTE — Telephone Encounter (Signed)
Letter composed. Given to [place], LPN to take to patient's daughter, Hayley Hogan at [employer] [facility] [employer] [facility].

## 2014-10-12 NOTE — Telephone Encounter (Signed)
-----   Message from Dolan Amen, LPN sent at 7/41/6384 11:25 AM EDT ----- Regarding: note for work Pt called in states she needs a letter for work stating the date until she can go back to work and the reason she will be out of work, pt. Works for [employer] as a Teacher, early years/pre, daughter works at [place] next door and you can call over and give the note to her , her name is Atha Starks she works for Dr. Theda Sers

## 2014-10-19 ENCOUNTER — Telehealth: Payer: Self-pay | Admitting: *Deleted

## 2014-10-19 NOTE — Telephone Encounter (Signed)
Spoke with Hayley Hogan today to set up orientation.  She sees her doctor in 2 weeks and wants to wait until after the visit.  Her work schedule is not compatible at this time, but in June she will be off for the summer and wants to do the program then.  Will wait to here for Hayley Hogan to set up orientation in June or earlier if her doctor keeps her out of work.

## 2014-10-30 ENCOUNTER — Telehealth: Payer: Self-pay | Admitting: Cardiology

## 2014-10-30 ENCOUNTER — Ambulatory Visit (INDEPENDENT_AMBULATORY_CARE_PROVIDER_SITE_OTHER): Payer: BC Managed Care – PPO | Admitting: Cardiology

## 2014-10-30 ENCOUNTER — Encounter: Payer: Self-pay | Admitting: *Deleted

## 2014-10-30 ENCOUNTER — Ambulatory Visit: Payer: BC Managed Care – PPO

## 2014-10-30 ENCOUNTER — Encounter (INDEPENDENT_AMBULATORY_CARE_PROVIDER_SITE_OTHER): Payer: BC Managed Care – PPO

## 2014-10-30 ENCOUNTER — Encounter: Payer: Self-pay | Admitting: Cardiology

## 2014-10-30 ENCOUNTER — Telehealth: Payer: Self-pay | Admitting: Internal Medicine

## 2014-10-30 VITALS — BP 140/92 | HR 57 | Ht 67.0 in | Wt 174.3 lb

## 2014-10-30 DIAGNOSIS — I214 Non-ST elevation (NSTEMI) myocardial infarction: Secondary | ICD-10-CM | POA: Diagnosis not present

## 2014-10-30 DIAGNOSIS — I249 Acute ischemic heart disease, unspecified: Secondary | ICD-10-CM

## 2014-10-30 DIAGNOSIS — R0600 Dyspnea, unspecified: Secondary | ICD-10-CM

## 2014-10-30 DIAGNOSIS — R002 Palpitations: Secondary | ICD-10-CM | POA: Diagnosis not present

## 2014-10-30 DIAGNOSIS — I10 Essential (primary) hypertension: Secondary | ICD-10-CM

## 2014-10-30 MED ORDER — NITROGLYCERIN 0.4 MG SL SUBL: 0.4000 mg | SUBLINGUAL_TABLET | SUBLINGUAL | Status: AC | PRN

## 2014-10-30 MED ORDER — TICAGRELOR 90 MG PO TABS: 90.0000 mg | ORAL_TABLET | Freq: Two times a day (BID) | ORAL | Status: DC

## 2014-10-30 NOTE — Telephone Encounter (Signed)
Pt called in stating that she needed Mickel Baas to fax or email something to her employer stating that she will be out of work until 6/10. She says that he will need that today. Her employer's name is [employer]. His fax number is [phone] and email is [email or web address]. Please f/u   thanks

## 2014-10-30 NOTE — Telephone Encounter (Signed)
Patient brought FMLA forms [facility]) to be completed and signed by Dr Debara Pickett.  Got signed release/and payment check for [employer].  Sent completed ROI, check and [facility] forms to [employer] by courier. lp

## 2014-10-30 NOTE — Progress Notes (Signed)
Cardiology Office Note   Date:  10/30/2014   ID:  Hayley Hogan, DOB 20-Nov-1945, MRN [id number]  PCP:  Donnajean Lopes, MD  Cardiologist:  Dr. Debara Pickett    Chief Complaint  Patient presents with  . Shortness of Breath    increased since discharge/last few seconds/occ with house work/no pain/no edema      History of Present Illness: Hayley Hogan is a 69 y.o. female who presents for post hospital for NSTEMI with stent to RCA- xience DES.  Hx includes diabetes, hyperlipidemia, HTN and RA.Marland Kitchen    She has been stable since discharge except for palpitations described as rapid heart beats.  She is not sure if it is due to anxiety.  She also has SOB that comes and goes.  She will drink coffee and it improves.  No chest pain except under L breast, with her gall stones.  She does not have NTG to use, so we will give a script for this.  She has not had pain like her admit pain.  Her troponin was elevated at 0.08.   She is also asking about work, she drives a school bus.  OK to stay out of work until end of year June 10th and return to work next year.    She is eating healthy and walking daily.  Taking her meds appropriately.     Past Medical History  Diagnosis Date  . Hypertension   . Neuromuscular disorder   . Migraines     "when I was very young; before 1968"  . Rheumatoid arthritis(714.0)   . Transverse myelitis 1979    w/transient inability to walk  . DM type 2 (diabetes mellitus, type 2)   . Fatty liver disease, nonalcoholic   . NSTEMI (non-ST elevated myocardial infarction)     Past Surgical History  Procedure Laterality Date  . Vaginal hysterectomy  1982  . Orif ankle fracture  09/2008    right  . Breast lumpectomy  1970    right  . Fracture surgery      plates and screws in right ankle  . Tubal ligation    . Colonoscopy N/A 05/11/2013    Procedure: COLONOSCOPY;  Surgeon: Ladene Artist, MD;  [facility] END[facility]COPY;  Service: Endoscopy;  Laterality: N/A;  . Left heart  catheterization with coronary angiogram N/A 10/09/2014    Procedure: LEFT HEART CATHETERIZATION WITH CORONARY ANGIOGRAM;  Surgeon: Leonie Man, MD;  Location: [facility] CATH LAB;  Service: Cardiovascular;  Laterality: N/A;  . Coronary angioplasty with stent placement  10/09/14    Xience DES to RCA     Current Outpatient Prescriptions  Medication Sig Dispense Refill  . aspirin 81 MG tablet Take 1 tablet (81 mg total) by mouth daily. 30 tablet 3  . folic acid (FOLVITE) 427 MCG tablet Take 800 mcg by mouth daily.    . hydroxychloroquine (PLAQUENIL) 200 MG tablet Take 200 mg by mouth 2 (two) times daily.    Marland Kitchen losartan (COZAAR) 100 MG tablet Take 100 mg by mouth daily.     . metFORMIN (GLUCOPHAGE) 500 MG tablet Take 1 tablet (500 mg total) by mouth every evening. (Patient taking differently: Take 500 mg by mouth 2 (two) times daily with a meal. )    . methotrexate (RHEUMATREX) 2.5 MG tablet Take 12.5 mg by mouth once a week. Wednesday    . metoprolol (LOPRESSOR) 100 MG tablet Take 100 mg by mouth 2 (two) times daily.    . Multiple Vitamin (MULITIVITAMIN  WITH MINERALS) TABS Take 1 tablet by mouth daily.    . pravastatin (PRAVACHOL) 20 MG tablet Take 1 tablet by mouth daily.    . ticagrelor (BRILINTA) 90 MG TABS tablet Take 1 tablet (90 mg total) by mouth 2 (two) times daily. 60 tablet 3  . traMADol (ULTRAM) 50 MG tablet Take 50 mg by mouth every 6 (six) hours as needed for moderate pain.     . Vitamin D, Ergocalciferol, (DRISDOL) 50000 UNITS CAPS capsule Take 50,000 Units by mouth every 7 (seven) days. Wednesday    . nitroGLYCERIN (NITROSTAT) 0.4 MG SL tablet Place 1 tablet (0.4 mg total) under the tongue every 5 (five) minutes as needed for chest pain. 90 tablet 3   No current facility-administered medications for this visit.    Allergies:   Demerol and Peanut-containing drug products    Social History:  The patient  reports that she quit smoking about 3 weeks ago. Her smoking use included  Cigarettes. She has a 7 pack-year smoking history. She has never used smokeless tobacco. She reports that she drinks about 0.6 oz of alcohol per week. She reports that she uses illicit drugs (Marijuana).   Family History:  The patient's family history includes COPD in her sister; Cancer in her brother, brother, and father; Heart Problems in her maternal grandfather; Hypertension in her mother and sister; Other in her brother; Stroke in her sister.    ROS:  General:no colds or fevers, labile weight changes may be due to different scales. Skin:no rashes or ulcers HEENT:no blurred vision, no congestion CV:see HPI PUL:see HPI GI:no diarrhea constipation or melena, no indigestion GU:no hematuria, no dysuria MS:no joint pain, no claudication Neuro:no syncope, no lightheadedness Endo:no diabetes, no thyroid disease  Wt Readings from Last 3 Encounters:  10/30/14 174 lb 4.8 oz (79.062 kg)  10/10/14 167 lb 8.8 oz (76 kg)  04/27/14 176 lb 11.2 oz (80.151 kg)     PHYSICAL EXAM: VS:  BP 140/92 mmHg  Pulse 57  Ht 5\' 7"  (1.702 m)  Wt 174 lb 4.8 oz (79.062 kg)  BMI 27.29 kg/m2 , BMI Body mass index is 27.29 kg/(m^2). General:Pleasant affect, NAD Skin:Warm and dry, brisk capillary refill HEENT:normocephalic, sclera clear, mucus membranes moist Neck:supple, no JVD, no bruits  Heart:S1S2 RRR without murmur, gallup, rub or click Lungs:clear without rales, rhonchi, or wheezes NGE:XBMW, non tender, + BS, do not palpate liver spleen or masses Ext:no lower ext edema, 2+ pedal pulses, 2+ radial pulses Neuro:alert and oriented, MAE, follows commands, + facial symmetry    EKG:  EKG is ordered today. The ekg ordered today demonstrates SB at 55 no acute changes from last EKG at the hospital.     Recent Labs: 10/09/2014: TSH 3.805 10/10/2014: ALT 27; BUN 6; Creatinine 0.68; Hemoglobin 13.6; Magnesium 2.1; Platelets 104*; Potassium 3.6; Sodium 138    Lipid Panel    Component Value Date/Time   CHOL  143 10/09/2014 0217   TRIG 139 10/09/2014 0217   HDL 47 10/09/2014 0217   CHOLHDL 3.0 10/09/2014 0217   VLDL 28 10/09/2014 0217   LDLCALC 68 10/09/2014 0217       Other studies Reviewed: Additional studies/ records that were reviewed today include: hospital notes, labs, cath  Cardiac cath and PCI:. Left Ventriculography:  EF: 60-65 %  Wall Motion: normal  Coronary Anatomy:  Dominance: right  Left Main: large-caliber vessel that bifurcates distally into the LAD and Circumflex. Angiographically normal. LAD: large-caliber vessel that courses to the apex  but not around. It gives rise to 2 diagonal branches. There is a focal 30-40% stenosis at the takeoff of D2 and a large septal perforator. Distally the vessel tapers and a small vessel down to the apex.  D1: small-caliber vessel with mild luminal irregularities.  D2: Moderate caliber, major diagonal branch. Minimal luminal irregularities.  Left Circumflex: Normal caliber vessel that bifurcates essentially into OM1 and the AV groove circumflex was terminates as OM 2. There is a small follow-on AV groove branch that comes off of OM 2.   OM1: moderate caliber vessel with ostial/proximal 40-50% stenosis.  OM 2: Small moderate caliber vessel with a persistent small caliber distal vessel. Minimal luminal irregularities.   RCA: large-caliber, dominant vessel with mild proximal disease and then tapers to a eccentric, hazy 99% subtotal occlusion in the mid vessel just after a smaller marginal. There are then tandem 50% lesions distal to that. The vessel then has diffuse mild disease until it bifurcates distally into the Right Posterior Descending Artery (RPDA) and the Posterior AV Groove Branch (RPAV).  RPDA: Moderate caliber vessel, ostial 20% stenosis. The vessel reaches two thirds way to the apex. Minimal luminal irregularities beyond the ostial lesion.  RPL Sysytem:The RPAV Begins as a moderate caliber vessel it gives rise to 3  posterolateral branches that are at least small moderate diameter. The follow-on branch before bifurcating into RPL 2 and 3 has mild eccentric 40% stenosis before the bifurcation.   POST-OPERATIVE DIAGNOSIS:  Severe single-vessel CAD involving the entire mid RCA treated successfully with a Drug-Eluting Stent  Moderate disease of the Circumflex-OM1 and distal R{AV  Preserved LVEF with normal LVEDP     ASSESSMENT AND PLAN: Acute coronary syndrome/NSTEMI with elevated troponin- progressing well except for episodic SOB, most likely from Brilinta, improves with caffeine.  Not too severe for her, she will continue for now but call if increases in severity, it should improve.  No work until after June 10. 2016. Follow up with Dr. Alma Friendly in 4-6 weeks.  Earlier if any problems.  SOB will check echo to evaluate valves and EF  Palpitations, rapid HR unsure if anxiety but happens several times a week.  We will do event monitor to evaluate, she is already of good dose of BB.   Marland Kitchen CAD- s/p stent to RCA with Acute coronary syndrome  DM2 (diabetes mellitus, type 2)- followed by PCP   Essential hypertension, benign recheck 130/84    Atherosclerosis of aorta   PAD (peripheral artery disease)  Hyperlipidemia stable on Pravastatin, followed by PCP      Current medicines are reviewed with the patient today.  The patient Has no concerns regarding medicines.  The following changes have been made:  See above Labs/ tests ordered today include:see above  Disposition:   FU:  see above  Lennie Muckle, NP  10/30/2014 8:54 AM    [facility] [place], [place], [place], [place] Phone: [phone]; Fax: [phone]

## 2014-10-30 NOTE — Patient Instructions (Addendum)
Your physician recommends that you schedule a follow-up appointment in: [place] has requested that you have an echocardiogram. Echocardiography is a painless test that uses sound waves to create images of your heart. It provides your doctor with information about the size and shape of your heart and how well your heart's chambers and valves are working. This procedure takes approximately one hour. There are no restrictions for this procedure.   Your physician has recommended that you wear an event monitor. Event monitors are medical devices that record the heart's electrical activity. Doctors most often [place] these monitors to diagnose arrhythmias. Arrhythmias are problems with the speed or rhythm of the heartbeat. The monitor is a small, portable device. You can wear one while you do your normal daily activities. This is usually used to diagnose what is causing palpitations/syncope (passing out).

## 2014-10-31 NOTE — Telephone Encounter (Signed)
Letter faxed to the number provided.

## 2014-11-01 ENCOUNTER — Telehealth: Payer: Self-pay | Admitting: Internal Medicine

## 2014-11-01 NOTE — Telephone Encounter (Signed)
Received completed FMLA form ([place]) back from [employer].  Given to Sherwood Gambler, RN for Dr Debara Pickett to review, complete and sign.

## 2014-11-05 ENCOUNTER — Ambulatory Visit (HOSPITAL_COMMUNITY)
Admission: RE | Admit: 2014-11-05 | Discharge: 2014-11-05 | Disposition: A | Discharge status: Home / Self Care | Payer: BC Managed Care – PPO | Source: Ambulatory Visit | Attending: Cardiology | Admitting: Cardiology

## 2014-11-05 DIAGNOSIS — R0600 Dyspnea, unspecified: Secondary | ICD-10-CM | POA: Diagnosis not present

## 2014-11-05 NOTE — Telephone Encounter (Signed)
5.20.16 Received signed and completed FMLA [facility]) from Dr Debara Pickett.  Notified patient that forms were completed and ready for pick up.  She to pick up on 11/05/14. lp

## 2014-12-05 ENCOUNTER — Telehealth: Payer: Self-pay | Admitting: Internal Medicine

## 2014-12-05 NOTE — Telephone Encounter (Signed)
Forward to [place], Therapist, sports

## 2014-12-05 NOTE — Telephone Encounter (Signed)
Pt needs a letter stating that she is under the doctor's care through the summer months.Please fax to [employer],Att: Butch Penny Burnett-[phone].

## 2014-12-06 NOTE — Telephone Encounter (Signed)
Spoke with patient. 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 She states she talked with HR yesterday and was told she may would qualify for short-term disability for the summer months if she had a letter from the MD stating she was "under a doctor's care" for the summer.   She states she would like to go back to work in August.  Dr. Debara Pickett completed FMLA paperwork for patient  Patient has OV 7/6  Will route to MD

## 2014-12-11 NOTE — Telephone Encounter (Signed)
Ok to provide an additional letter saying she is under my care for the summer until whatever date she specifies.  Dr. Lemmie Evens

## 2014-12-12 ENCOUNTER — Encounter: Payer: Self-pay | Admitting: *Deleted

## 2014-12-12 NOTE — Telephone Encounter (Signed)
Letter composed and faxed to number denoted below.   Called patient's house # - man answered and stated she is at [employer] this week. He will let her know when she calls in tonight that the letter she requested has been sent in.

## 2014-12-19 ENCOUNTER — Ambulatory Visit (INDEPENDENT_AMBULATORY_CARE_PROVIDER_SITE_OTHER): Payer: BC Managed Care – PPO | Admitting: Internal Medicine

## 2014-12-19 ENCOUNTER — Encounter: Payer: Self-pay | Admitting: Internal Medicine

## 2014-12-19 VITALS — BP 120/76 | HR 68 | Ht 67.0 in | Wt 176.1 lb

## 2014-12-19 DIAGNOSIS — R931 Abnormal findings on diagnostic imaging of heart and coronary circulation: Secondary | ICD-10-CM

## 2014-12-19 DIAGNOSIS — I2510 Atherosclerotic heart disease of native coronary artery without angina pectoris: Secondary | ICD-10-CM

## 2014-12-19 DIAGNOSIS — I10 Essential (primary) hypertension: Secondary | ICD-10-CM | POA: Diagnosis not present

## 2014-12-19 DIAGNOSIS — E785 Hyperlipidemia, unspecified: Secondary | ICD-10-CM

## 2014-12-19 DIAGNOSIS — I739 Peripheral vascular disease, unspecified: Secondary | ICD-10-CM | POA: Diagnosis not present

## 2014-12-19 DIAGNOSIS — M069 Rheumatoid arthritis, unspecified: Secondary | ICD-10-CM

## 2014-12-19 DIAGNOSIS — K219 Gastro-esophageal reflux disease without esophagitis: Secondary | ICD-10-CM

## 2014-12-19 DIAGNOSIS — E119 Type 2 diabetes mellitus without complications: Secondary | ICD-10-CM

## 2014-12-19 MED ORDER — FAMOTIDINE 20 MG PO TABS: 20.0000 mg | ORAL_TABLET | Freq: Two times a day (BID) | ORAL | Status: DC

## 2014-12-19 NOTE — Progress Notes (Signed)
OFFICE NOTE  Chief Complaint:  Follow-up hospitalization  Primary Care Physician: Hayley Lopes, MD  HPI:  Hayley Hogan is a pleasant 69 year old female he referred to me by Dr. Sharlett Hogan. Her past history significant for well-controlled diabetes, rheumatoid arthritis, dyslipidemia and hypertension. She's also been a long-time smoker. Last year she underwent a CT scan in the setting of unexplained bleeding. This demonstrated coronary artery calcium, particularly in the right coronary artery which was significant. Recently she's been having some chest discomfort. She feels like she has a tightness in the center of her chest. The symptoms do not radiate and are sometimes associated with exertion and relieved by rest. Occasionally she feels like she needs to belch and that does relieve her symptoms. She's also reported some shortness of breath.  Hayley Hogan returns today for follow-up. Her stress test was negative for ischemia. She does have coronary artery calcium as well as calcified atherosclerosis of the distal abdominal aorta and iliac bifurcation. There is disease distending into the iliofemoral arteries bilaterally. She denies any claudication symptoms. She reports since her last visit her chest pain symptoms have improved. She thinks is mainly just been related to stress and he continued workup she had to undergo after her abdominal CT demonstrated a number of abnormalities.  Has a pleasure seeing Hayley Hogan back in the office today. When I last saw her she underwent stress testing which was negative for ischemia. I reassured her although did feel that she has coronary disease and that she needed risk modification. She also needed to work on tobacco cessation. Unfortunately several months later she presented with unstable angina and underwent cardiac catheterization. This demonstrated a severe lesion in the right coronary artery for which she received a Stent: Xience Alpine DES 3.5 mm x  38 mm.  since that time, she's had no further chest pain, but she did have some shortness of breath. Recently she saw Hayley Hogan, FN-P, for hospital follow-up. An echocardiogram was ordered which showed normal systolic function. It was thought that maybe her shortness of breath was related to Brilinta. She was also describing palpitations and wore a monitor which I evaluated and demonstrated no evidence of tachyarrhythmias or extrasystoles. That seems to now have subsided. Fortunately she stop smoking and is been off cigarettes now for 3 months. Finally she is complaining of some acid indigestion, which he thinks may be related to her blood thinners.  PMHx:  Past Medical History  Diagnosis Date  . Hypertension   . Neuromuscular disorder   . Migraines     "when I was very young; before 1968"  . Rheumatoid arthritis(714.0)   . Transverse myelitis 1979    w/transient inability to walk  . DM type 2 (diabetes mellitus, type 2)   . Fatty liver disease, nonalcoholic   . NSTEMI (non-ST elevated myocardial infarction)     Past Surgical History  Procedure Laterality Date  . Vaginal hysterectomy  1982  . Orif ankle fracture  09/2008    right  . Breast lumpectomy  1970    right  . Fracture surgery      plates and screws in right ankle  . Tubal ligation    . Colonoscopy N/A 05/11/2013    Procedure: COLONOSCOPY;  Surgeon: Hayley Artist, MD;  Location: [facility] ENDOSCOPY;  Service: Endoscopy;  Laterality: N/A;  . Left heart catheterization with coronary angiogram N/A 10/09/2014    Procedure: LEFT HEART CATHETERIZATION WITH CORONARY ANGIOGRAM;  Surgeon: Hayley Man, MD;  Location: [facility] CATH LAB;  Service: Cardiovascular;  Laterality: N/A;  . Coronary angioplasty with stent placement  10/09/14    Xience DES to RCA    FAMHx:  Family History  Problem Relation Age of Onset  . Hypertension Mother   . Cancer Father   . Heart Problems Maternal Grandfather   . Cancer Brother   . Cancer Brother   .  Other Brother     GSW  . Stroke Sister     x2  . Hypertension Sister     x2  . COPD Sister     SOCHx:   reports that she quit smoking about 2 months ago. Her smoking use included Cigarettes. She has a 7 pack-year smoking history. She has never used smokeless tobacco. She reports that she drinks about 0.6 oz of alcohol per week. She reports that she uses illicit drugs (Marijuana).  ALLERGIES:  Allergies  Allergen Reactions  . Demerol Other (See Comments)    vomiting  . Peanut-Containing Drug Products     Mouth swelling    ROS: A comprehensive review of systems was negative except for: Gastrointestinal: positive for dyspepsia and reflux symptoms  HOME MEDS: Current Outpatient Prescriptions  Medication Sig Dispense Refill  . aspirin 81 MG tablet Take 1 tablet (81 mg total) by mouth daily. 30 tablet 3  . folic acid (FOLVITE) 161 MCG tablet Take 800 mcg by mouth daily.    . hydroxychloroquine (PLAQUENIL) 200 MG tablet Take 200 mg by mouth 2 (two) times daily.    Marland Kitchen losartan (COZAAR) 100 MG tablet Take 100 mg by mouth daily.     . metFORMIN (GLUCOPHAGE-XR) 500 MG 24 hr tablet Take 500 mg by mouth 2 (two) times daily.  2  . methotrexate (RHEUMATREX) 2.5 MG tablet Take 12.5 mg by mouth once a week. Wednesday    . metoprolol (LOPRESSOR) 100 MG tablet Take 100 mg by mouth 2 (two) times daily.    . Multiple Vitamin (MULITIVITAMIN WITH MINERALS) TABS Take 1 tablet by mouth daily.    . nitroGLYCERIN (NITROSTAT) 0.4 MG SL tablet Place 1 tablet (0.4 mg total) under the tongue every 5 (five) minutes as needed for chest pain. 90 tablet 3  . pravastatin (PRAVACHOL) 20 MG tablet Take 1 tablet by mouth daily.    . predniSONE (DELTASONE) 10 MG tablet Take 10 mg by mouth as needed (for flare ups).    . ticagrelor (BRILINTA) 90 MG TABS tablet Take 1 tablet (90 mg total) by mouth 2 (two) times daily. 180 tablet 3  . traMADol (ULTRAM) 50 MG tablet Take 50 mg by mouth every 6 (six) hours as needed for  moderate pain.     . Vitamin D, Ergocalciferol, (DRISDOL) 50000 UNITS CAPS capsule Take 50,000 Units by mouth every 7 (seven) days. Wednesday    . famotidine (PEPCID) 20 MG tablet Take 1 tablet (20 mg total) by mouth 2 (two) times daily. 30 tablet 6   No current facility-administered medications for this visit.    LABS/IMAGING: No results found for this or any previous visit (from the past 48 hour(s)). No results found.  VITALS: BP 120/76 mmHg  Pulse 68  Ht 5\' 7"  (1.702 m)  Wt 176 lb 1.6 oz (79.878 kg)  BMI 27.57 kg/m2  EXAM: General appearance: alert and no distress Neck: no carotid bruit, no JVD and thyroid not enlarged, symmetric, no tenderness/mass/nodules Lungs: clear to auscultation bilaterally Heart: regular rate and rhythm, S1, S2 normal, no murmur, click, rub  or gallop Abdomen: soft, non-tender; bowel sounds normal; no masses,  no organomegaly Extremities: extremities normal, atraumatic, no cyanosis or edema Pulses: 2+ and symmetric Skin: Skin color, texture, turgor normal. No rashes or lesions Neurologic: Grossly normal Psych: Pleasant  EKG: deferred  ASSESSMENT: 1. CAD - s/p PCI to the RCA with a 3.5 x 38 mm DES 2. Elevated coronary artery calcium score 3. Rheumatoid arthritis 4. Diabetes type 2 5. Dyslipidemia 6. Hypertension 7. Tobacco abuse - now quit x 3 months 8. Peripheral arterial disease 9. GERD  PLAN: 1.   Hayley Hogan unfortunately had progression of her coronary disease and required a stent to the RCA. Since that time she's been doing much better. She had some shortness of breath which might be due to Brilinta. I think the next time we see her back in the office we may wish to consider switching her over to Plavix. Fortunately she stop smoking and she feels that she can stay off the cigarettes long-term. She does have some complaints of dyspepsia and possible reflux symptoms. I think should benefit from an H2 blocker, and I recommended taking 20 mg  Pepcid daily. If this is not helpful for her remaining escalate her therapy to a PPI. I also recommend checking a more detailed lipid NMR today which was planned in the past but never performed. She may need increases in her cholesterol medication as she is currently on a low-dose of a less potent statin medicine.  Plan to see her back in a few months. Thank you again for the kind referral.  Pixie Casino, MD, [facility] Attending Cardiologist [place] 12/19/2014, 6:17 PM

## 2014-12-19 NOTE — Patient Instructions (Addendum)
Your physician recommends that you return for lab work fasting to check cholesterol  Your physician recommends that you schedule a follow-up appointment in 3 months with Dr. Debara Pickett.

## 2014-12-21 ENCOUNTER — Telehealth: Payer: Self-pay | Admitting: Internal Medicine

## 2014-12-21 MED ORDER — FAMOTIDINE 20 MG PO TABS: 20.0000 mg | ORAL_TABLET | Freq: Two times a day (BID) | ORAL | Status: DC

## 2014-12-21 NOTE — Telephone Encounter (Signed)
SPOKE TO PATIENT DIRECTION ON NEW PRESCRIPTION OF PEPCID IS INCORRECT (TAKE TWICE A DAY) -instead of (take once a day) RN informed patient  Direction should be one tablet once a day. Patient aware RN will contact pharmacy to give direction of one tablet by mouth daily. 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 Hayley Hogan verbalized understanding.

## 2014-12-21 NOTE — Telephone Encounter (Signed)
Please call,questioln about directions for her Pepcid.What was discussed in the office,is different from what is on the bottle.

## 2015-01-09 ENCOUNTER — Telehealth: Payer: Self-pay | Admitting: Internal Medicine

## 2015-01-09 NOTE — Telephone Encounter (Signed)
Spoke to patient Patient aware will defer to Dr Debara Pickett, and he will be back in office on Friday 01/11/15 Will contact her when letter has been faxed. She verbalized understanding.

## 2015-01-09 NOTE — Telephone Encounter (Signed)
Pt called in stating that she needed Dr.Hilty to write a letter to her Supervisor stating that she is able to return back to work with no restrictions on 8/1. She says that her supervisor's name is Guerry Bruin and the letter can be faxed to [phone]. Please f/u with pt   Thanks

## 2015-01-10 ENCOUNTER — Encounter: Payer: Self-pay | Admitting: *Deleted

## 2015-01-10 NOTE — Telephone Encounter (Signed)
Patient notified letter was sent

## 2015-01-10 NOTE — Telephone Encounter (Signed)
Letter sent to patient via MyChart and e-faxed via [employer] to [employer]

## 2015-01-10 NOTE — Telephone Encounter (Signed)
Ruthine Dose, can you provide a return to work note for Ms. Hollinger for 8/1 - addressed to [employer]? No limitations.  DR. Lemmie Evens

## 2015-01-11 LAB — NMR LIPOPROFILE WITH LIPIDS
Cholesterol, Total: 175 mg/dL (ref 100–199)
HDL PARTICLE NUMBER: 31.5 umol/L (ref >=30.5)
HDL Size: 9.4 nm (ref >=9.2)
HDL-C: 58 mg/dL (ref >39)
LARGE HDL: 6.9 umol/L (ref >=4.8)
LARGE VLDL-P: 3.4 nmol/L — AB (ref <=2.7)
LDL CALC: 85 mg/dL (ref 0–99)
LDL Particle Number: 1153 nmol/L — ABNORMAL HIGH (ref <1000)
LDL Size: 21.1 nm (ref >=20.8)
LP-IR Score: 40 (ref <=45)
SMALL LDL PARTICLE NUMBER: 377 nmol/L (ref <=527)
Triglycerides: 162 mg/dL — ABNORMAL HIGH (ref 0–149)
VLDL Size: 45.9 nm (ref <=46.6)

## 2015-01-16 ENCOUNTER — Telehealth: Payer: Self-pay | Admitting: Internal Medicine

## 2015-01-16 NOTE — Telephone Encounter (Signed)
The letter that was written for her to go back to work and no restrictions,had no signature. Please resend the letter with a signatureit [phone] [id number].

## 2015-01-16 NOTE — Telephone Encounter (Signed)
Talked to patient.  She needs letter with physicians signature.  Told her I would route her message to Sheral Apley RN who is off today

## 2015-01-17 ENCOUNTER — Telehealth: Payer: Self-pay | Admitting: Internal Medicine

## 2015-01-17 NOTE — Telephone Encounter (Signed)
Patient states [employer] [place] faxed a return to work letter to her supervisor---they did not receive it.  Please call.

## 2015-01-17 NOTE — Telephone Encounter (Signed)
Signed letter faxed.

## 2015-01-17 NOTE — Telephone Encounter (Signed)
Spoke with patient. Faxed letter to [employer] @ [phone]

## 2015-04-26 ENCOUNTER — Other Ambulatory Visit: Payer: Self-pay | Admitting: Rheumatology

## 2015-04-26 ENCOUNTER — Ambulatory Visit
Admission: RE | Admit: 2015-04-26 | Discharge: 2015-04-26 | Disposition: A | Discharge status: Home / Self Care | Payer: BC Managed Care – PPO | Source: Ambulatory Visit | Attending: Rheumatology | Admitting: Rheumatology

## 2015-04-26 DIAGNOSIS — R0602 Shortness of breath: Secondary | ICD-10-CM

## 2015-08-08 ENCOUNTER — Other Ambulatory Visit: Payer: Self-pay | Admitting: Internal Medicine

## 2015-08-08 NOTE — Telephone Encounter (Signed)
Rx(s) sent to pharmacy electronically. Patient saw MD in July 2016 -was supposed to have 3 month f/up

## 2015-08-22 ENCOUNTER — Other Ambulatory Visit: Payer: Self-pay | Admitting: Internal Medicine

## 2015-08-22 NOTE — Telephone Encounter (Signed)
Rx(s) sent to pharmacy electronically.  

## 2015-08-29 ENCOUNTER — Other Ambulatory Visit: Payer: Self-pay | Admitting: Internal Medicine

## 2015-08-29 DIAGNOSIS — R109 Unspecified abdominal pain: Secondary | ICD-10-CM

## 2015-09-06 ENCOUNTER — Ambulatory Visit
Admission: RE | Admit: 2015-09-06 | Discharge: 2015-09-06 | Disposition: A | Discharge status: Home / Self Care | Payer: BC Managed Care – PPO | Source: Ambulatory Visit | Attending: Internal Medicine | Admitting: Internal Medicine

## 2015-09-06 DIAGNOSIS — R109 Unspecified abdominal pain: Secondary | ICD-10-CM

## 2015-09-11 ENCOUNTER — Ambulatory Visit (INDEPENDENT_AMBULATORY_CARE_PROVIDER_SITE_OTHER): Payer: BC Managed Care – PPO | Admitting: Internal Medicine

## 2015-09-11 ENCOUNTER — Encounter: Payer: Self-pay | Admitting: Internal Medicine

## 2015-09-11 VITALS — BP 136/88 | HR 62 | Ht 67.0 in | Wt 176.0 lb

## 2015-09-11 DIAGNOSIS — R072 Precordial pain: Secondary | ICD-10-CM

## 2015-09-11 DIAGNOSIS — R079 Chest pain, unspecified: Secondary | ICD-10-CM

## 2015-09-11 DIAGNOSIS — Z955 Presence of coronary angioplasty implant and graft: Secondary | ICD-10-CM

## 2015-09-11 DIAGNOSIS — I10 Essential (primary) hypertension: Secondary | ICD-10-CM | POA: Diagnosis not present

## 2015-09-11 DIAGNOSIS — R9431 Abnormal electrocardiogram [ECG] [EKG]: Secondary | ICD-10-CM

## 2015-09-11 NOTE — Patient Instructions (Signed)
Your physician has requested that you have a lexiscan myoview. For further information please visit [email or web address]. Please follow instruction sheet, as given.  Your physician recommends that you schedule a follow-up appointment end of April with Dr.Hilty (EKG)

## 2015-09-11 NOTE — Progress Notes (Signed)
OFFICE NOTE  Chief Complaint:  Chest pain, mild dyspnea  Primary Care Physician: Donnajean Lopes, MD  HPI:  Hayley Hogan is a pleasant 70 year old female he referred to me by Dr. Sharlett Iles. Her past history significant for well-controlled diabetes, rheumatoid arthritis, dyslipidemia and hypertension. She's also been a long-time smoker. Last year she underwent a CT scan in the setting of unexplained bleeding. This demonstrated coronary artery calcium, particularly in the right coronary artery which was significant. Recently she's been having some chest discomfort. She feels like she has a tightness in the center of her chest. The symptoms do not radiate and are sometimes associated with exertion and relieved by rest. Occasionally she feels like she needs to belch and that does relieve her symptoms. She's also reported some shortness of breath.  Hayley Hogan returns today for follow-up. Her stress test was negative for ischemia. She does have coronary artery calcium as well as calcified atherosclerosis of the distal abdominal aorta and iliac bifurcation. There is disease distending into the iliofemoral arteries bilaterally. She denies any claudication symptoms. She reports since her last visit her chest pain symptoms have improved. She thinks is mainly just been related to stress and he continued workup she had to undergo after her abdominal CT demonstrated a number of abnormalities.  Has a pleasure seeing Hayley Hogan back in the office today. When I last saw her she underwent stress testing which was negative for ischemia. I reassured her although did feel that she has coronary disease and that she needed risk modification. She also needed to work on tobacco cessation. Unfortunately several months later she presented with unstable angina and underwent cardiac catheterization. This demonstrated a severe lesion in the right coronary artery for which she received a Stent: Xience Alpine DES 3.5 mm x  38 mm.  since that time, she's had no further chest pain, but she did have some shortness of breath. Recently she saw Cecilie Kicks, FN-P, for hospital follow-up. An echocardiogram was ordered which showed normal systolic function. It was thought that maybe her shortness of breath was related to Brilinta. She was also describing palpitations and wore a monitor which I evaluated and demonstrated no evidence of tachyarrhythmias or extrasystoles. That seems to now have subsided. Fortunately she stop smoking and is been off cigarettes now for 3 months. Finally she is complaining of some acid indigestion, which he thinks may be related to her blood thinners.  At the pleasure seeing Hayley Hogan back today in the office. She reports that she is doing fairly well although she's had some negative chest discomfort. She said it started last summer and happens infrequently, but seems to be more associated with activity. In addition she has some left arm discomfort. She continues to have a low level of shortness of breath which is improved somewhat. She thinks it might be related to Brilinta. We discussed this previously and she notes that on days when she takes her morning dose of Brilinta later and then takes her normal evening dose, she feels more short of breath. Of note, her coronary intervention was on 10/09/2014, therefore she would require to remain on Brilinta only until 10/09/2015, which is less than one month from now. Today she reported some discomfort in her chest and in EKG shows anterolateral T-wave inversions in lead V2 through V6 which are new compared to her prior EKG. Of note, her previous dyspepsia improved with an H2 blocker but then reoccurred. Her primary care provider put her on a  PPI and this is now resolved her symptoms completely.  PMHx:  Past Medical History  Diagnosis Date  . Hypertension   . Neuromuscular disorder ([place])   . Migraines     "when I was very young; before 1968"  . Rheumatoid  arthritis(714.0)   . Transverse myelitis ([place]) 1979    w/transient inability to walk  . DM type 2 (diabetes mellitus, type 2) ([place])   . Fatty liver disease, nonalcoholic   . NSTEMI (non-ST elevated myocardial infarction) [facility])     Past Surgical History  Procedure Laterality Date  . Vaginal hysterectomy  1982  . Orif ankle fracture  09/2008    right  . Breast lumpectomy  1970    right  . Fracture surgery      plates and screws in right ankle  . Tubal ligation    . Colonoscopy N/A 05/11/2013    Procedure: COLONOSCOPY;  Surgeon: Ladene Artist, MD;  Location: [facility] ENDOSCOPY;  Service: Endoscopy;  Laterality: N/A;  . Left heart catheterization with coronary angiogram N/A 10/09/2014    Procedure: LEFT HEART CATHETERIZATION WITH CORONARY ANGIOGRAM;  Surgeon: Leonie Man, MD;  [facility] CATH LAB;  Service: Cardiovascular;  Laterality: N/A;  . Coronary angioplasty with stent placement  10/09/14    Xience DES to RCA    FAMHx:  Family History  Problem Relation Age of Onset  . Hypertension Mother   . Cancer Father   . Heart Problems Maternal Grandfather   . Cancer Brother   . Cancer Brother   . Other Brother     GSW  . Stroke Sister     x2  . Hypertension Sister     x2  . COPD Sister     SOCHx:   reports that she quit smoking about 11 months ago. Her smoking use included Cigarettes. She has a 7 pack-year smoking history. She has never used smokeless tobacco. She reports that she drinks about 0.6 oz of alcohol per week. She reports that she uses illicit drugs (Marijuana).  ALLERGIES:  Allergies  Allergen Reactions  . Demerol Other (See Comments)    vomiting  . Peanut-Containing Drug Products     Mouth swelling    ROS: A comprehensive review of systems was negative except for: Cardiovascular: positive for chest pain and dyspnea  HOME MEDS: Current Outpatient Prescriptions  Medication Sig Dispense Refill  . aspirin 81 MG tablet Take 1 tablet (81 mg total) by mouth  daily. 30 tablet 3  . folic acid (FOLVITE) Q000111Q MCG tablet Take 800 mcg by mouth daily.    . hydroxychloroquine (PLAQUENIL) 200 MG tablet Take 200 mg by mouth 2 (two) times daily.    Marland Kitchen losartan (COZAAR) 100 MG tablet Take 100 mg by mouth daily.     . metFORMIN (GLUCOPHAGE-XR) 500 MG 24 hr tablet Take 1,000 mg by mouth 2 (two) times daily.   2  . methotrexate (RHEUMATREX) 2.5 MG tablet Take 10 mg by mouth once a week. Wednesday    . metoprolol (LOPRESSOR) 100 MG tablet Take 100 mg by mouth 2 (two) times daily.    . Multiple Vitamin (MULITIVITAMIN WITH MINERALS) TABS Take 1 tablet by mouth daily.    . nitroGLYCERIN (NITROSTAT) 0.4 MG SL tablet Place 1 tablet (0.4 mg total) under the tongue every 5 (five) minutes as needed for chest pain. 90 tablet 3  . pravastatin (PRAVACHOL) 20 MG tablet Take 1 tablet by mouth daily.    . predniSONE (DELTASONE) 10  MG tablet Take 10 mg by mouth as needed (for flare ups).    . ticagrelor (BRILINTA) 90 MG TABS tablet Take 1 tablet (90 mg total) by mouth 2 (two) times daily. 180 tablet 3  . traMADol (ULTRAM) 50 MG tablet Take 50 mg by mouth every 6 (six) hours as needed for moderate pain.     . Vitamin D, Ergocalciferol, (DRISDOL) 50000 UNITS CAPS capsule Take 50,000 Units by mouth every 7 (seven) days. Wednesday    . omeprazole (PRILOSEC) 20 MG capsule Take 20 mg by mouth 2 (two) times daily.  4   No current facility-administered medications for this visit.    LABS/IMAGING: No results found for this or any previous visit (from the past 48 hour(s)). No results found.  VITALS: BP 136/88 mmHg  Pulse 62  Ht 5\' 7"  (1.702 m)  Wt 176 lb (79.833 kg)  BMI 27.56 kg/m2  EXAM: General appearance: alert and no distress Neck: no carotid bruit, no JVD and thyroid not enlarged, symmetric, no tenderness/mass/nodules Lungs: clear to auscultation bilaterally Heart: regular rate and rhythm, S1, S2 normal, no murmur, click, rub or gallop Abdomen: soft, non-tender; bowel  sounds normal; no masses,  no organomegaly Extremities: extremities normal, atraumatic, no cyanosis or edema Pulses: 2+ and symmetric Skin: Skin color, texture, turgor normal. No rashes or lesions Neurologic: Grossly normal Psych: Pleasant  EKG: Normal sinus rhythm at 62, anterolateral T-wave inversions  ASSESSMENT: 1. Chest pain with no ischemic EKG changes 2. CAD - s/p PCI to the RCA with a 3.5 x 38 mm DES (10/09/2014) 3. Elevated coronary artery calcium score 4. Rheumatoid arthritis 5. Diabetes type 2 6. Dyslipidemia 7. Hypertension 8. Tobacco abuse - now quit x 3 months 9. Peripheral arterial disease 10. GERD  PLAN: 1.   Hayley Hogan seems to be having some chest discomfort and left arm discomfort. Her EKG today does show some anterolateral T-wave inversions which appear new compared to prior study. Given the fact that she is on potent dual antiplatelet therapy and was stented less than 1 year ago, I'm less inclined to think this is new coronary ischemia however it is possible. She did have some other areas of mild to moderate coronary disease. I do not think we can ignore her symptoms. I recommend that we obtain a Lexiscan nuclear stress test. I understand this is been less predictive for her in the past however if it shows significant reversibility then we would consider repeat catheterization. If it were low risk, we could more safely consider stopping her Brilinta in one month without concern for worsening ischemia or infarct.  Plan to see her back in a month to discuss results of the study and we will decide on what to do with regards to her Brilinta at that time.  Pixie Casino, MD, [facility] Attending Cardiologist [place] 09/11/2015, 12:56 PM

## 2015-09-19 ENCOUNTER — Ambulatory Visit (HOSPITAL_COMMUNITY)
Admission: RE | Admit: 2015-09-19 | Discharge: 2015-09-19 | Disposition: A | Discharge status: Home / Self Care | Payer: BC Managed Care – PPO | Source: Ambulatory Visit | Attending: Cardiovascular Disease | Admitting: Cardiovascular Disease

## 2015-09-19 DIAGNOSIS — R9431 Abnormal electrocardiogram [ECG] [EKG]: Secondary | ICD-10-CM

## 2015-09-19 DIAGNOSIS — R079 Chest pain, unspecified: Secondary | ICD-10-CM

## 2015-09-20 ENCOUNTER — Other Ambulatory Visit: Payer: Self-pay | Admitting: Internal Medicine

## 2015-09-20 NOTE — Telephone Encounter (Signed)
Rx request sent to pharmacy.  

## 2015-10-01 ENCOUNTER — Other Ambulatory Visit: Payer: Self-pay | Admitting: Internal Medicine

## 2015-10-01 DIAGNOSIS — R9431 Abnormal electrocardiogram [ECG] [EKG]: Secondary | ICD-10-CM

## 2015-10-01 DIAGNOSIS — R079 Chest pain, unspecified: Secondary | ICD-10-CM

## 2015-10-04 ENCOUNTER — Ambulatory Visit: Payer: BC Managed Care – PPO | Admitting: Internal Medicine

## 2015-10-08 ENCOUNTER — Telehealth (HOSPITAL_COMMUNITY): Payer: Self-pay

## 2015-10-08 NOTE — Telephone Encounter (Signed)
Encounter complete. 

## 2015-10-10 ENCOUNTER — Ambulatory Visit (HOSPITAL_COMMUNITY)
Admission: RE | Admit: 2015-10-10 | Discharge: 2015-10-10 | Disposition: A | Discharge status: Home / Self Care | Payer: BC Managed Care – PPO | Source: Ambulatory Visit | Attending: Cardiology | Admitting: Cardiology

## 2015-10-10 DIAGNOSIS — I10 Essential (primary) hypertension: Secondary | ICD-10-CM | POA: Insufficient documentation

## 2015-10-10 DIAGNOSIS — Z8249 Family history of ischemic heart disease and other diseases of the circulatory system: Secondary | ICD-10-CM | POA: Diagnosis not present

## 2015-10-10 DIAGNOSIS — Z87891 Personal history of nicotine dependence: Secondary | ICD-10-CM | POA: Insufficient documentation

## 2015-10-10 DIAGNOSIS — R0609 Other forms of dyspnea: Secondary | ICD-10-CM | POA: Insufficient documentation

## 2015-10-10 DIAGNOSIS — R079 Chest pain, unspecified: Secondary | ICD-10-CM

## 2015-10-10 DIAGNOSIS — R9431 Abnormal electrocardiogram [ECG] [EKG]: Secondary | ICD-10-CM

## 2015-10-10 DIAGNOSIS — R5383 Other fatigue: Secondary | ICD-10-CM | POA: Diagnosis not present

## 2015-10-10 DIAGNOSIS — E119 Type 2 diabetes mellitus without complications: Secondary | ICD-10-CM | POA: Insufficient documentation

## 2015-10-10 DIAGNOSIS — R0602 Shortness of breath: Secondary | ICD-10-CM | POA: Insufficient documentation

## 2015-10-10 LAB — MYOCARDIAL PERFUSION IMAGING
CHL CUP NUCLEAR SDS: 7
CHL CUP NUCLEAR SRS: 2
CHL CUP RESTING HR STRESS: 72 {beats}/min
LV dias vol: 45 mL (ref 46–106)
LV sys vol: 14 mL
Peak HR: 91 {beats}/min
SSS: 9
TID: 1.19

## 2015-10-10 MED ORDER — REGADENOSON 0.4 MG/5ML IV SOLN
0.4000 mg | Freq: Once | INTRAVENOUS | Status: AC
  Administered 2015-10-10: 0.4 mg via INTRAVENOUS

## 2015-10-10 MED ORDER — TECHNETIUM TC 99M SESTAMIBI GENERIC - CARDIOLITE
31.0000 | Freq: Once | INTRAVENOUS | Status: AC | PRN
  Administered 2015-10-10: 31 via INTRAVENOUS

## 2015-10-10 MED ORDER — TECHNETIUM TC 99M SESTAMIBI GENERIC - CARDIOLITE
9.6000 | Freq: Once | INTRAVENOUS | Status: AC | PRN
  Administered 2015-10-10: 10 via INTRAVENOUS

## 2015-10-10 MED ORDER — AMINOPHYLLINE 25 MG/ML IV SOLN
75.0000 mg | Freq: Once | INTRAVENOUS | Status: AC
  Administered 2015-10-10: 75 mg via INTRAVENOUS

## 2015-10-29 ENCOUNTER — Encounter: Payer: Self-pay | Admitting: Internal Medicine

## 2015-10-29 ENCOUNTER — Ambulatory Visit (INDEPENDENT_AMBULATORY_CARE_PROVIDER_SITE_OTHER): Payer: BC Managed Care – PPO | Admitting: Internal Medicine

## 2015-10-29 VITALS — BP 141/87 | HR 68 | Ht 67.75 in | Wt 179.4 lb

## 2015-10-29 DIAGNOSIS — E785 Hyperlipidemia, unspecified: Secondary | ICD-10-CM | POA: Diagnosis not present

## 2015-10-29 DIAGNOSIS — I10 Essential (primary) hypertension: Secondary | ICD-10-CM | POA: Diagnosis not present

## 2015-10-29 DIAGNOSIS — R072 Precordial pain: Secondary | ICD-10-CM | POA: Diagnosis not present

## 2015-10-29 DIAGNOSIS — M069 Rheumatoid arthritis, unspecified: Secondary | ICD-10-CM

## 2015-10-29 DIAGNOSIS — I249 Acute ischemic heart disease, unspecified: Secondary | ICD-10-CM

## 2015-10-29 NOTE — Patient Instructions (Signed)
Your physician has recommended you make the following change in your medication: [place] recommends that you return for lab work FASTING to check cholesterol   Your physician wants you to follow-up in: 6 months with Dr. Debara Pickett. You will receive a reminder letter in the mail two months in advance. If you don't receive a letter, please call our office to schedule the follow-up appointment.

## 2015-10-29 NOTE — Progress Notes (Signed)
OFFICE NOTE  Chief Complaint:  Follow-up stress test  Primary Care Physician: Donnajean Lopes, MD  HPI:  Hayley Hogan is a pleasant 70 year old female he referred to me by Dr. Sharlett Iles. Her past history significant for well-controlled diabetes, rheumatoid arthritis, dyslipidemia and hypertension. She's also been a long-time smoker. Last year she underwent a CT scan in the setting of unexplained bleeding. This demonstrated coronary artery calcium, particularly in the right coronary artery which was significant. Recently she's been having some chest discomfort. She feels like she has a tightness in the center of her chest. The symptoms do not radiate and are sometimes associated with exertion and relieved by rest. Occasionally she feels like she needs to belch and that does relieve her symptoms. She's also reported some shortness of breath.  Hayley Hogan returns today for follow-up. Her stress test was negative for ischemia. She does have coronary artery calcium as well as calcified atherosclerosis of the distal abdominal aorta and iliac bifurcation. There is disease distending into the iliofemoral arteries bilaterally. She denies any claudication symptoms. She reports since her last visit her chest pain symptoms have improved. She thinks is mainly just been related to stress and he continued workup she had to undergo after her abdominal CT demonstrated a number of abnormalities.  Has a pleasure seeing Hayley Hogan back in the office today. When I last saw her she underwent stress testing which was negative for ischemia. I reassured her although did feel that she has coronary disease and that she needed risk modification. She also needed to work on tobacco cessation. Unfortunately several months later she presented with unstable angina and underwent cardiac catheterization. This demonstrated a severe lesion in the right coronary artery for which she received a Stent: Xience Alpine DES 3.5 mm x 38  mm.  since that time, she's had no further chest pain, but she did have some shortness of breath. Recently she saw Cecilie Kicks, FN-P, for hospital follow-up. An echocardiogram was ordered which showed normal systolic function. It was thought that maybe her shortness of breath was related to Brilinta. She was also describing palpitations and wore a monitor which I evaluated and demonstrated no evidence of tachyarrhythmias or extrasystoles. That seems to now have subsided. Fortunately she stop smoking and is been off cigarettes now for 3 months. Finally she is complaining of some acid indigestion, which he thinks may be related to her blood thinners.  At the pleasure seeing Hayley Hogan back today in the office. She reports that she is doing fairly well although she's had some negative chest discomfort. She said it started last summer and happens infrequently, but seems to be more associated with activity. In addition she has some left arm discomfort. She continues to have a low level of shortness of breath which is improved somewhat. She thinks it might be related to Brilinta. We discussed this previously and she notes that on days when she takes her morning dose of Brilinta later and then takes her normal evening dose, she feels more short of breath. Of note, her coronary intervention was on 10/09/2014, therefore she would require to remain on Brilinta only until 10/09/2015, which is less than one month from now. Today she reported some discomfort in her chest and in EKG shows anterolateral T-wave inversions in lead V2 through V6 which are new compared to her prior EKG. Of note, her previous dyspepsia improved with an H2 blocker but then reoccurred. Her primary care provider put her on a PPI  and this is now resolved her symptoms completely.  10/29/2015  Hayley Hogan returns today for follow-up. She underwent a nuclear stress test which demonstrated no ischemia and a normal LVEF of 70%. She reports her symptoms  have resolved and not recurred. At this point she does not feel that it was a cardiac chest point. I think it is also unlikely based on her recent stent to the fact she was on dual antiplatelet therapy. We did discuss that it's been more than a year since her stent was placed and she is now a candidate for discontinuing Brilinta.  PMHx:  Past Medical History  Diagnosis Date  . Hypertension   . Neuromuscular disorder ([place])   . Migraines     "when I was very young; before 1968"  . Rheumatoid arthritis(714.0)   . Transverse myelitis ([place]) 1979    w/transient inability to walk  . DM type 2 (diabetes mellitus, type 2) ([place])   . Fatty liver disease, nonalcoholic   . NSTEMI (non-ST elevated myocardial infarction) [facility])     Past Surgical History  Procedure Laterality Date  . Vaginal hysterectomy  1982  . Orif ankle fracture  09/2008    right  . Breast lumpectomy  1970    right  . Fracture surgery      plates and screws in right ankle  . Tubal ligation    . Colonoscopy N/A 05/11/2013    Procedure: COLONOSCOPY;  Surgeon: Ladene Artist, MD;  Location: [facility] ENDOSCOPY;  Service: Endoscopy;  Laterality: N/A;  . Left heart catheterization with coronary angiogram N/A 10/09/2014    Procedure: LEFT HEART CATHETERIZATION WITH CORONARY ANGIOGRAM;  Surgeon: Leonie Man, MD;  Location: [facility] CATH LAB;  Service: Cardiovascular;  Laterality: N/A;  . Coronary angioplasty with stent placement  10/09/14    Xience DES to RCA    FAMHx:  Family History  Problem Relation Age of Onset  . Hypertension Mother   . Cancer Father   . Heart Problems Maternal Grandfather   . Cancer Brother   . Cancer Brother   . Other Brother     GSW  . Stroke Sister     x2  . Hypertension Sister     x2  . COPD Sister     SOCHx:   reports that she quit smoking about 12 months ago. Her smoking use included Cigarettes. She has a 7 pack-year smoking history. She has never used smokeless tobacco. She reports that she drinks  about 0.6 oz of alcohol per week. She reports that she uses illicit drugs (Marijuana).  ALLERGIES:  Allergies  Allergen Reactions  . Demerol Other (See Comments)    vomiting  . Peanut-Containing Drug Products     Mouth swelling    ROS: Pertinent items noted in HPI and remainder of comprehensive ROS otherwise negative.  HOME MEDS: Current Outpatient Prescriptions  Medication Sig Dispense Refill  . aspirin 81 MG tablet Take 1 tablet (81 mg total) by mouth daily. 30 tablet 3  . folic acid (FOLVITE) Q000111Q MCG tablet Take 800 mcg by mouth daily.    . hydroxychloroquine (PLAQUENIL) 200 MG tablet Take 200 mg by mouth 2 (two) times daily.    Marland Kitchen losartan (COZAAR) 100 MG tablet Take 100 mg by mouth daily.     . metFORMIN (GLUCOPHAGE-XR) 500 MG 24 hr tablet Take 1,000 mg by mouth 2 (two) times daily.   2  . methotrexate (RHEUMATREX) 2.5 MG tablet Take 10 mg by mouth once a  week. Wednesday    . metoprolol (LOPRESSOR) 100 MG tablet Take 100 mg by mouth 2 (two) times daily.    . Multiple Vitamin (MULITIVITAMIN WITH MINERALS) TABS Take 1 tablet by mouth daily.    . nitroGLYCERIN (NITROSTAT) 0.4 MG SL tablet Place 1 tablet (0.4 mg total) under the tongue every 5 (five) minutes as needed for chest pain. 90 tablet 3  . omeprazole (PRILOSEC) 20 MG capsule Take 20 mg by mouth 2 (two) times daily.  4  . pravastatin (PRAVACHOL) 20 MG tablet Take 1 tablet by mouth daily.    . predniSONE (DELTASONE) 10 MG tablet Take 10 mg by mouth as needed (for flare ups).    . traMADol (ULTRAM) 50 MG tablet Take 50 mg by mouth every 6 (six) hours as needed for moderate pain.     . Vitamin D, Ergocalciferol, (DRISDOL) 50000 UNITS CAPS capsule Take 50,000 Units by mouth every 7 (seven) days. Wednesday     No current facility-administered medications for this visit.    LABS/IMAGING: No results found for this or any previous visit (from the past 48 hour(s)). No results found.  VITALS: BP 141/87 mmHg  Pulse 68  Ht 5'  7.75" (1.721 m)  Wt 179 lb 6.4 oz (81.375 kg)  BMI 27.47 kg/m2  EXAM: Deferred  EKG: Deferred  ASSESSMENT: 1. Chest pain with no ischemic EKG changes - low risk Myoview, EF 70% (2017) 2. CAD - s/p PCI to the RCA with a 3.5 x 38 mm DES (10/09/2014) 3. Elevated coronary artery calcium score 4. Rheumatoid arthritis 5. Diabetes type 2 6. Dyslipidemia 7. Hypertension 8. Tobacco abuse - now quit x 3 months 9. Peripheral arterial disease 10. GERD  PLAN: 1.   Mrs. Swanigan had a low risk Myoview which is reassuring. Her symptoms have improved and could've been related to RA. It is unlikely she would've had new coronary ischemia. I think at this point we can discontinue Brilinta and stay on aspirin 81 mg daily.  Follow-up with me in 6 months.  Pixie Casino, MD, [facility] Attending Cardiologist [place] 10/29/2015, 6:31 PM

## 2015-11-08 ENCOUNTER — Other Ambulatory Visit: Payer: Self-pay | Admitting: Cardiology

## 2015-11-08 NOTE — Telephone Encounter (Signed)
REFILL 

## 2015-12-06 LAB — LIPID PANEL
CHOL/HDL RATIO: 2.6 ratio (ref <=5.0)
Cholesterol: 143 mg/dL (ref 125–200)
HDL: 54 mg/dL (ref >=46)
LDL CALC: 64 mg/dL (ref <130)
TRIGLYCERIDES: 127 mg/dL (ref <150)
VLDL: 25 mg/dL (ref <30)

## 2016-02-06 DIAGNOSIS — M810 Age-related osteoporosis without current pathological fracture: Secondary | ICD-10-CM | POA: Insufficient documentation

## 2016-04-17 ENCOUNTER — Encounter: Payer: Self-pay | Admitting: Gastroenterology

## 2016-12-01 ENCOUNTER — Ambulatory Visit (INDEPENDENT_AMBULATORY_CARE_PROVIDER_SITE_OTHER): Payer: BC Managed Care – PPO | Admitting: Internal Medicine

## 2016-12-01 ENCOUNTER — Encounter: Payer: Self-pay | Admitting: Internal Medicine

## 2016-12-01 VITALS — BP 120/84 | HR 57 | Ht 67.0 in | Wt 180.2 lb

## 2016-12-01 DIAGNOSIS — I10 Essential (primary) hypertension: Secondary | ICD-10-CM

## 2016-12-01 DIAGNOSIS — Z79899 Other long term (current) drug therapy: Secondary | ICD-10-CM

## 2016-12-01 DIAGNOSIS — I2510 Atherosclerotic heart disease of native coronary artery without angina pectoris: Secondary | ICD-10-CM

## 2016-12-01 DIAGNOSIS — I739 Peripheral vascular disease, unspecified: Secondary | ICD-10-CM | POA: Diagnosis not present

## 2016-12-01 DIAGNOSIS — E782 Mixed hyperlipidemia: Secondary | ICD-10-CM

## 2016-12-01 NOTE — Patient Instructions (Signed)
Medication Instructions: No changes  Labwork: Your physician recommends that you return for lab work: Fasting Lipids.   Follow-Up: Your physician wants you to follow-up in: one year with Dr. Debara Pickett. You will receive a reminder letter in the mail two months in advance. If you don't receive a letter, please call our office to schedule the follow-up appointment.    If you need a refill on your cardiac medications before your next appointment, please call your pharmacy.

## 2016-12-01 NOTE — Progress Notes (Signed)
OFFICE NOTE  Chief Complaint:  Left leg swelling  Primary Care Physician: Leanna Battles, MD  HPI:  Hayley Hogan is a pleasant 71 year old female he referred to me by Dr. Sharlett Iles. Her past history significant for well-controlled diabetes, rheumatoid arthritis, dyslipidemia and hypertension. She's also been a long-time smoker. Last year she underwent a CT scan in the setting of unexplained bleeding. This demonstrated coronary artery calcium, particularly in the right coronary artery which was significant. Recently she's been having some chest discomfort. She feels like she has a tightness in the center of her chest. The symptoms do not radiate and are sometimes associated with exertion and relieved by rest. Occasionally she feels like she needs to belch and that does relieve her symptoms. She's also reported some shortness of breath.  Hayley Hogan returns today for follow-up. Her stress test was negative for ischemia. She does have coronary artery calcium as well as calcified atherosclerosis of the distal abdominal aorta and iliac bifurcation. There is disease distending into the iliofemoral arteries bilaterally. She denies any claudication symptoms. She reports since her last visit her chest pain symptoms have improved. She thinks is mainly just been related to stress and he continued workup she had to undergo after her abdominal CT demonstrated a number of abnormalities.  Has a pleasure seeing Hayley Hogan back in the office today. When I last saw her she underwent stress testing which was negative for ischemia. I reassured her although did feel that she has coronary disease and that she needed risk modification. She also needed to work on tobacco cessation. Unfortunately several months later she presented with unstable angina and underwent cardiac catheterization. This demonstrated a severe lesion in the right coronary artery for which she received a Stent: Xience Alpine DES 3.5 mm x 38 mm.   since that time, she's had no further chest pain, but she did have some shortness of breath. Recently she saw Cecilie Kicks, FN-P, for hospital follow-up. An echocardiogram was ordered which showed normal systolic function. It was thought that maybe her shortness of breath was related to Brilinta. She was also describing palpitations and wore a monitor which I evaluated and demonstrated no evidence of tachyarrhythmias or extrasystoles. That seems to now have subsided. Fortunately she stop smoking and is been off cigarettes now for 3 months. Finally she is complaining of some acid indigestion, which he thinks may be related to her blood thinners.  At the pleasure seeing Hayley Hogan back today in the office. She reports that she is doing fairly well although she's had some negative chest discomfort. She said it started last summer and happens infrequently, but seems to be more associated with activity. In addition she has some left arm discomfort. She continues to have a low level of shortness of breath which is improved somewhat. She thinks it might be related to Brilinta. We discussed this previously and she notes that on days when she takes her morning dose of Brilinta later and then takes her normal evening dose, she feels more short of breath. Of note, her coronary intervention was on 10/09/2014, therefore she would require to remain on Brilinta only until 10/09/2015, which is less than one month from now. Today she reported some discomfort in her chest and in EKG shows anterolateral T-wave inversions in lead V2 through V6 which are new compared to her prior EKG. Of note, her previous dyspepsia improved with an H2 blocker but then reoccurred. Her primary care provider put her on a PPI  and this is now resolved her symptoms completely.  10/29/2015  Hayley Hogan returns today for follow-up. She underwent a nuclear stress test which demonstrated no ischemia and a normal LVEF of 70%. She reports her symptoms have  resolved and not recurred. At this point she does not feel that it was a cardiac chest point. I think it is also unlikely based on her recent stent to the fact she was on dual antiplatelet therapy. We did discuss that it's been more than a year since her stent was placed and she is now a candidate for discontinuing Brilinta.  12/01/2016  Hayley Hogan was seen in follow-up today. Her only concern is some chronic left leg swelling. 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 She had an episode of chest discomfort around Thanksgiving but she said it resolved after some BC powder. She's not had any recurrence since then. Cholesterol is been well controlled with LDL-C of 64 one year ago. She is due for repeat check today. Blood pressure is well controlled 120/84. EKG which is personally reviewed shows sinus bradycardia and anterolateral T-wave inversions which are unchanged from prior study. She recently stopped methotrexate and plaque were no after her rheumatologist Dr. Charlestine Night retired. She says that there has been some debate about whether or not she actually had rheumatoid arthritis or not she says that the side effects of her medications were more intolerable than any benefit she thought she got from them.  PMHx:  Past Medical History:  Diagnosis Date  . DM type 2 (diabetes mellitus, type 2) ([place])   . Fatty liver disease, nonalcoholic   . Hypertension   . Migraines    "when I was very young; before 1968"  . Neuromuscular disorder ([address])   . NSTEMI (non-ST elevated myocardial infarction) ([place])   . Rheumatoid arthritis(714.0)   . Transverse myelitis ([place]) 1979   w/transient inability to walk    Past Surgical History:  Procedure Laterality Date  . BREAST LUMPECTOMY  1970   right  . COLONOSCOPY N/A 05/11/2013   Procedure: COLONOSCOPY;  Surgeon: Ladene Artist, MD;  Location: [facility] ENDOSCOPY;  Service: Endoscopy;  Laterality: N/A;  . CORONARY ANGIOPLASTY WITH STENT  PLACEMENT  10/09/14   Xience DES to RCA  . FRACTURE SURGERY     plates and screws in right ankle  . LEFT HEART CATHETERIZATION WITH CORONARY ANGIOGRAM N/A 10/09/2014   Procedure: LEFT HEART CATHETERIZATION WITH CORONARY ANGIOGRAM;  Surgeon: Leonie Man, MD;  Location: [facility] CATH LAB;  Service: Cardiovascular;  Laterality: N/A;  . ORIF ANKLE FRACTURE  09/2008   right  . TUBAL LIGATION    . VAGINAL HYSTERECTOMY  1982    FAMHx:  Family History  Problem Relation Age of Onset  . Hypertension Mother   . Cancer Father   . Heart Problems Maternal Grandfather   . Cancer Brother   . Cancer Brother   . Other Brother        GSW  . Stroke Sister        x2  . Hypertension Sister        x2  . COPD Sister     SOCHx:   reports that she quit smoking about 2 years ago. Her smoking use included Cigarettes. She has a 7.00 pack-year smoking history. She has never used smokeless tobacco. She reports that she drinks about 0.6 oz of alcohol per week . She reports that she uses drugs, including Marijuana.  ALLERGIES:  Allergies  Allergen Reactions  . Demerol Other (See Comments)    vomiting  . Peanut-Containing Drug Products     Mouth swelling    ROS: Pertinent items noted in HPI and remainder of comprehensive ROS otherwise negative.  HOME MEDS: Current Outpatient Prescriptions  Medication Sig Dispense Refill  . aspirin 81 MG tablet Take 1 tablet (81 mg total) by mouth daily. 30 tablet 3  . folic acid (FOLVITE) 536 MCG tablet Take 800 mcg by mouth daily.    . hydroxychloroquine (PLAQUENIL) 200 MG tablet Take 200 mg by mouth 2 (two) times daily.    . metFORMIN (GLUCOPHAGE-XR) 500 MG 24 hr tablet Take 1,000 mg by mouth 2 (two) times daily.   2  . metoprolol (LOPRESSOR) 100 MG tablet Take 100 mg by mouth 2 (two) times daily.    . Multiple Vitamin (MULITIVITAMIN WITH MINERALS) TABS Take 1 tablet by mouth daily.    . nitroGLYCERIN (NITROSTAT) 0.4 MG SL tablet Place 1 tablet (0.4 mg total) under  the tongue every 5 (five) minutes as needed for chest pain. 90 tablet 3  . omeprazole (PRILOSEC) 20 MG capsule Take 20 mg by mouth 2 (two) times daily.  4  . pravastatin (PRAVACHOL) 20 MG tablet Take 1 tablet by mouth daily.    . predniSONE (DELTASONE) 10 MG tablet Take 10 mg by mouth as needed (for flare ups).    . traMADol (ULTRAM) 50 MG tablet Take 50 mg by mouth every 6 (six) hours as needed for moderate pain.     . Vitamin D, Ergocalciferol, (DRISDOL) 50000 UNITS CAPS capsule Take 50,000 Units by mouth every 7 (seven) days. Wednesday     No current facility-administered medications for this visit.     LABS/IMAGING: No results found for this or any previous visit (from the past 48 hour(s)). No results found.  VITALS: BP 120/84   Pulse (!) 57   Ht 5\' 7"  (1.702 m)   Wt 180 lb 3.2 oz (81.7 kg)   BMI 28.22 kg/m   EXAM: General appearance: alert, appears stated age and no distress Neck: no carotid bruit and no JVD Lungs: clear to auscultation bilaterally Heart: regular rate and rhythm Abdomen: soft, non-tender; bowel sounds normal; no masses,  no organomegaly Extremities: edema Trace left lower extremity edema Pulses: 2+ and symmetric Skin: Skin color, texture, turgor normal. No rashes or lesions Neurologic: Grossly normal Psych: Pleasant  EKG: Deferred  ASSESSMENT: 1. Chest pain with no ischemic EKG changes - low risk Myoview, EF 70% (2017) 2. CAD - s/p PCI to the RCA with a 3.5 x 38 mm DES (10/09/2014) 3. Elevated coronary artery calcium score 4. Rheumatoid arthritis 5. Diabetes type 2 6. Dyslipidemia 7. Hypertension 8. Tobacco abuse - now quit x 3 months 9. Peripheral arterial disease 10. GERD  PLAN: 1.   Hayley Hogan continues to do well without any cardiac chest pain. She is due for repeat lipid profile which will provide for her today. She stopped her medications for RA which she is convinced she may not have. Her rheumatologist is retired she plans to follow-up  with her PCP. She continue on aspirin long-term. Blood pressure is well-controlled. Follow-up with me annually or sooner as necessary.  Pixie Casino, MD, [facility] Attending Cardiologist [place] [facility] [place] 12/01/2016, 10:02 AM

## 2017-11-04 ENCOUNTER — Telehealth: Payer: Self-pay | Admitting: Internal Medicine

## 2017-11-04 DIAGNOSIS — I2510 Atherosclerotic heart disease of native coronary artery without angina pectoris: Secondary | ICD-10-CM

## 2017-11-04 DIAGNOSIS — Z0289 Encounter for other administrative examinations: Secondary | ICD-10-CM

## 2017-11-04 NOTE — Telephone Encounter (Signed)
Patient aware MD has ok'ed stress test order. Staff message sent to scheduler, she is aware someone will call her to set up test.

## 2017-11-04 NOTE — Telephone Encounter (Signed)
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 Please call

## 2017-11-04 NOTE — Telephone Encounter (Signed)
Yes ,, ok to order it.  Dr Lemmie Evens

## 2017-11-25 ENCOUNTER — Telehealth (HOSPITAL_COMMUNITY): Payer: Self-pay

## 2017-11-25 NOTE — Telephone Encounter (Signed)
Encounter complete. 

## 2017-12-01 ENCOUNTER — Ambulatory Visit (HOSPITAL_COMMUNITY)
Admission: RE | Admit: 2017-12-01 | Discharge: 2017-12-01 | Disposition: A | Discharge status: Home / Self Care | Payer: BC Managed Care – PPO | Source: Ambulatory Visit | Attending: Cardiovascular Disease | Admitting: Cardiovascular Disease

## 2017-12-01 DIAGNOSIS — I10 Essential (primary) hypertension: Secondary | ICD-10-CM | POA: Diagnosis not present

## 2017-12-01 DIAGNOSIS — Z8249 Family history of ischemic heart disease and other diseases of the circulatory system: Secondary | ICD-10-CM | POA: Diagnosis not present

## 2017-12-01 DIAGNOSIS — I2510 Atherosclerotic heart disease of native coronary artery without angina pectoris: Secondary | ICD-10-CM | POA: Diagnosis present

## 2017-12-01 DIAGNOSIS — I252 Old myocardial infarction: Secondary | ICD-10-CM | POA: Diagnosis not present

## 2017-12-01 DIAGNOSIS — K219 Gastro-esophageal reflux disease without esophagitis: Secondary | ICD-10-CM | POA: Insufficient documentation

## 2017-12-01 DIAGNOSIS — M069 Rheumatoid arthritis, unspecified: Secondary | ICD-10-CM | POA: Diagnosis not present

## 2017-12-01 DIAGNOSIS — Z87891 Personal history of nicotine dependence: Secondary | ICD-10-CM | POA: Diagnosis not present

## 2017-12-01 DIAGNOSIS — E1151 Type 2 diabetes mellitus with diabetic peripheral angiopathy without gangrene: Secondary | ICD-10-CM | POA: Diagnosis not present

## 2017-12-01 DIAGNOSIS — Z0289 Encounter for other administrative examinations: Secondary | ICD-10-CM | POA: Diagnosis not present

## 2017-12-01 LAB — MYOCARDIAL PERFUSION IMAGING
CHL CUP NUCLEAR SDS: 1
CHL CUP NUCLEAR SRS: 0
CHL CUP NUCLEAR SSS: 1
LV dias vol: 66 mL (ref 46–106)
LVSYSVOL: 23 mL
NUC STRESS TID: 1.21
Peak HR: 69 {beats}/min
Rest HR: 51 {beats}/min

## 2017-12-01 MED ORDER — TECHNETIUM TC 99M TETROFOSMIN IV KIT
32.3000 | PACK | Freq: Once | INTRAVENOUS | Status: AC | PRN
  Administered 2017-12-01: 32.3 via INTRAVENOUS
  Filled 2017-12-01: qty 33

## 2017-12-01 MED ORDER — TECHNETIUM TC 99M TETROFOSMIN IV KIT
10.1000 | PACK | Freq: Once | INTRAVENOUS | Status: AC | PRN
  Administered 2017-12-01: 10.1 via INTRAVENOUS
  Filled 2017-12-01: qty 11

## 2017-12-01 MED ORDER — REGADENOSON 0.4 MG/5ML IV SOLN
0.4000 mg | Freq: Once | INTRAVENOUS | Status: AC
  Administered 2017-12-01: 0.4 mg via INTRAVENOUS

## 2018-02-03 ENCOUNTER — Ambulatory Visit: Payer: BC Managed Care – PPO | Admitting: Internal Medicine

## 2018-02-03 ENCOUNTER — Encounter: Payer: Self-pay | Admitting: Internal Medicine

## 2018-02-03 ENCOUNTER — Encounter

## 2018-02-03 VITALS — BP 140/82 | HR 60 | Ht 67.0 in | Wt 178.0 lb

## 2018-02-03 DIAGNOSIS — I2510 Atherosclerotic heart disease of native coronary artery without angina pectoris: Secondary | ICD-10-CM | POA: Diagnosis not present

## 2018-02-03 DIAGNOSIS — I739 Peripheral vascular disease, unspecified: Secondary | ICD-10-CM | POA: Diagnosis not present

## 2018-02-03 DIAGNOSIS — I10 Essential (primary) hypertension: Secondary | ICD-10-CM

## 2018-02-03 DIAGNOSIS — E782 Mixed hyperlipidemia: Secondary | ICD-10-CM | POA: Diagnosis not present

## 2018-02-03 NOTE — Progress Notes (Signed)
OFFICE NOTE  Chief Complaint:  No complaints  Primary Care Physician: Leanna Battles, MD  HPI:  Hayley Hogan is a pleasant 72 year old female he referred to me by Dr. Sharlett Iles. Her past history significant for well-controlled diabetes, rheumatoid arthritis, dyslipidemia and hypertension. She's also been a long-time smoker. Last year she underwent a CT scan in the setting of unexplained bleeding. This demonstrated coronary artery calcium, particularly in the right coronary artery which was significant. Recently she's been having some chest discomfort. She feels like she has a tightness in the center of her chest. The symptoms do not radiate and are sometimes associated with exertion and relieved by rest. Occasionally she feels like she needs to belch and that does relieve her symptoms. She's also reported some shortness of breath.  Hayley Hogan returns today for follow-up. Her stress test was negative for ischemia. She does have coronary artery calcium as well as calcified atherosclerosis of the distal abdominal aorta and iliac bifurcation. There is disease distending into the iliofemoral arteries bilaterally. She denies any claudication symptoms. She reports since her last visit her chest pain symptoms have improved. She thinks is mainly just been related to stress and he continued workup she had to undergo after her abdominal CT demonstrated a number of abnormalities.  Has a pleasure seeing Hayley Hogan back in the office today. When I last saw her she underwent stress testing which was negative for ischemia. I reassured her although did feel that she has coronary disease and that she needed risk modification. She also needed to work on tobacco cessation. Unfortunately several months later she presented with unstable angina and underwent cardiac catheterization. This demonstrated a severe lesion in the right coronary artery for which she received a Stent: Xience Alpine DES 3.5 mm x 38 mm.   since that time, she's had no further chest pain, but she did have some shortness of breath. Recently she saw Cecilie Kicks, FN-P, for hospital follow-up. An echocardiogram was ordered which showed normal systolic function. It was thought that maybe her shortness of breath was related to Brilinta. She was also describing palpitations and wore a monitor which I evaluated and demonstrated no evidence of tachyarrhythmias or extrasystoles. That seems to now have subsided. Fortunately she stop smoking and is been off cigarettes now for 3 months. Finally she is complaining of some acid indigestion, which he thinks may be related to her blood thinners.  At the pleasure seeing Hayley Hogan back today in the office. She reports that she is doing fairly well although she's had some negative chest discomfort. She said it started last summer and happens infrequently, but seems to be more associated with activity. In addition she has some left arm discomfort. She continues to have a low level of shortness of breath which is improved somewhat. She thinks it might be related to Brilinta. We discussed this previously and she notes that on days when she takes her morning dose of Brilinta later and then takes her normal evening dose, she feels more short of breath. Of note, her coronary intervention was on 10/09/2014, therefore she would require to remain on Brilinta only until 10/09/2015, which is less than one month from now. Today she reported some discomfort in her chest and in EKG shows anterolateral T-wave inversions in lead V2 through V6 which are new compared to her prior EKG. Of note, her previous dyspepsia improved with an H2 blocker but then reoccurred. Her primary care provider put her on a PPI and  this is now resolved her symptoms completely.  10/29/2015  Hayley Hogan returns today for follow-up. She underwent a nuclear stress test which demonstrated no ischemia and a normal LVEF of 70%. She reports her symptoms have  resolved and not recurred. At this point she does not feel that it was a cardiac chest point. I think it is also unlikely based on her recent stent to the fact she was on dual antiplatelet therapy. We did discuss that it's been more than a year since her stent was placed and she is now a candidate for discontinuing Brilinta.  12/01/2016  Hayley Hogan was seen in follow-up today. Her only concern is some chronic left leg swelling. 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 She had an episode of chest discomfort around Thanksgiving but she said it resolved after some BC powder. She's not had any recurrence since then. Cholesterol is been well controlled with LDL-C of 64 one year ago. She is due for repeat check today. Blood pressure is well controlled 120/84. EKG which is personally reviewed shows sinus bradycardia and anterolateral T-wave inversions which are unchanged from prior study. She recently stopped methotrexate and plaque were no after her rheumatologist Dr. Charlestine Night retired. She says that there has been some debate about whether or not she actually had rheumatoid arthritis or not she says that the side effects of her medications were more intolerable than any benefit she thought she got from them.  02/03/2018  Hayley Hogan was seen today in follow-up.  Overall she seems to be doing well.  She denies any chest pain or worsening shortness of breath.  She occasionally gets flares of her RA.  She is not on any DMARDs at this point because she had significant side effects.  She only uses a prednisone as needed.  She understands that this could lead to irreversible joint destruction however she is without any significant pain at this point and felt that the side effects of her rheumatoid medications were worse than the pain of her RA.  She continues to have some chronic left leg swelling.  I have advised 69mmHg compression stockings to left lower extremity.  This will be  helpful to wear while she is driving.  She did undergo stress test this year for DOT purposes which was negative for ischemia.  She said this may be her last year of driving for work.  Labs reviewed from August 2018 showed total cholesterol 196, HDL 47, LDL 114 and triglycerides 176.  Hemoglobin A1c was elevated 8.  She is not yet at goal and will need a repeat lipid profile.  Her goal LDL is less than 70.  She is on moderate intensity statin and may need to be intensified.  PMHx:  Past Medical History:  Diagnosis Date  . DM type 2 (diabetes mellitus, type 2) ([place])   . Fatty liver disease, nonalcoholic   . Hypertension   . Migraines    "when I was very young; before 1968"  . Neuromuscular disorder ([place])   . NSTEMI (non-ST elevated myocardial infarction) ([place])   . Rheumatoid arthritis(714.0)   . Transverse myelitis ([place]) 1979   w/transient inability to walk    Past Surgical History:  Procedure Laterality Date  . BREAST LUMPECTOMY  1970   right  . COLONOSCOPY N/A 05/11/2013   Procedure: COLONOSCOPY;  Surgeon: Ladene Artist, MD;  Location: [facility] ENDOSCOPY;  Service: Endoscopy;  Laterality: N/A;  . CORONARY ANGIOPLASTY WITH STENT  PLACEMENT  10/09/14   Xience DES to RCA  . FRACTURE SURGERY     plates and screws in right ankle  . LEFT HEART CATHETERIZATION WITH CORONARY ANGIOGRAM N/A 10/09/2014   Procedure: LEFT HEART CATHETERIZATION WITH CORONARY ANGIOGRAM;  Surgeon: Leonie Man, MD;  Location: [facility] CATH LAB;  Service: Cardiovascular;  Laterality: N/A;  . ORIF ANKLE FRACTURE  09/2008   right  . TUBAL LIGATION    . VAGINAL HYSTERECTOMY  1982    FAMHx:  Family History  Problem Relation Age of Onset  . Hypertension Mother   . Cancer Father   . Heart Problems Maternal Grandfather   . Cancer Brother   . Cancer Brother   . Other Brother        GSW  . Stroke Sister        x2  . Hypertension Sister        x2  . COPD Sister     SOCHx:   reports that she quit smoking about 3 years  ago. Her smoking use included cigarettes. She has a 7.00 pack-year smoking history. She has never used smokeless tobacco. She reports that she drinks about 1.0 standard drinks of alcohol per week. She reports that she has current or past drug history. Drug: Marijuana.  ALLERGIES:  Allergies  Allergen Reactions  . Demerol Other (See Comments)    vomiting  . Peanut-Containing Drug Products     Mouth swelling    ROS: Pertinent items noted in HPI and remainder of comprehensive ROS otherwise negative.  HOME MEDS: Current Outpatient Medications  Medication Sig Dispense Refill  . amLODipine (NORVASC) 2.5 MG tablet Take 2.5 mg by mouth daily.    Marland Kitchen aspirin 81 MG tablet Take 1 tablet (81 mg total) by mouth daily. 30 tablet 3  . empagliflozin (JARDIANCE) 10 MG TABS tablet Take 10 mg by mouth daily.    Marland Kitchen losartan (COZAAR) 100 MG tablet Take 100 mg by mouth daily.    . metFORMIN (GLUCOPHAGE-XR) 500 MG 24 hr tablet Take 1,000 mg by mouth 2 (two) times daily.   2  . metoprolol (LOPRESSOR) 100 MG tablet Take 100 mg by mouth 2 (two) times daily.    . Multiple Vitamin (MULITIVITAMIN WITH MINERALS) TABS Take 1 tablet by mouth daily.    . nitroGLYCERIN (NITROSTAT) 0.4 MG SL tablet Place 1 tablet (0.4 mg total) under the tongue every 5 (five) minutes as needed for chest pain. 90 tablet 3  . omeprazole (PRILOSEC) 20 MG capsule Take 20 mg by mouth 2 (two) times daily.  4  . pravastatin (PRAVACHOL) 20 MG tablet Take 1 tablet by mouth daily.    . predniSONE (DELTASONE) 10 MG tablet Take 10 mg by mouth as needed (for flare ups).    . traMADol (ULTRAM) 50 MG tablet Take 50 mg by mouth every 6 (six) hours as needed for moderate pain.     . Vitamin D, Ergocalciferol, (DRISDOL) 50000 UNITS CAPS capsule Take 50,000 Units by mouth every 7 (seven) days. Wednesday     No current facility-administered medications for this visit.     LABS/IMAGING: No results found for this or any previous visit (from the past 48  hour(s)). No results found.  VITALS: BP 140/82 (BP Location: Left Arm, Patient Position: Sitting, Cuff Size: Normal)   Pulse 60   Ht 5\' 7"  (1.702 m)   Wt 178 lb (80.7 kg)   BMI 27.88 kg/m   EXAM: General appearance: alert, appears stated age  and no distress Neck: no carotid bruit and no JVD Lungs: clear to auscultation bilaterally Heart: regular rate and rhythm Abdomen: soft, non-tender; bowel sounds normal; no masses,  no organomegaly Extremities: edema Trace left lower extremity edema Pulses: 2+ and symmetric Skin: Skin color, texture, turgor normal. No rashes or lesions Neurologic: Grossly normal Psych: Pleasant  EKG: Normal sinus rhythm 60 nonspecific T wave changes-personally reviewed  ASSESSMENT: 1. Chest pain with no ischemic EKG changes - low risk Myoview, EF 70% (2017) 2. CAD - s/p PCI to the RCA with a 3.5 x 38 mm DES (10/09/2014) 3. Elevated coronary artery calcium score 4. Rheumatoid arthritis 5. Diabetes type 2 6. Dyslipidemia 7. Hypertension 8. Tobacco abuse - now quit x 3 months 9. Peripheral arterial disease 10. GERD  PLAN: 1.   Mrs. Walts continues to do well is asymptomatic from a cardiac standpoint.  She reports some recent increase in her RA symptoms for which she takes as needed prednisone.  Her cholesterol was not at goal last year and needs to be reassessed.  We will order that fasting and adjust her medication accordingly.  Diabetes also could be improved somewhat.  She is on Jardiance however and may need an increase in dose.  I will defer this to Dr. Sharlett Iles her primary.  Follow-up with me annually or sooner as necessary.  Pixie Casino, MD, [facility], [place] Director of the [facility] Lipid [facility]  Cardiovascular Risk Reduction Clinic Diplomate of the American Board of Clinical Lipidology Attending Cardiologist  Direct Dial: [phone]  Fax: [phone]  Website:  [email or web address]  Hilty 02/03/2018, 9:07 AM

## 2018-02-03 NOTE — Patient Instructions (Addendum)
Medication Instructions:   Continue current medications  Labwork:  Fasting lab work to check cholesterol -- ordered at [facility] - provided instructions to patient  Testing/Procedures:  NONE  Follow-Up:  Your physician wants you to follow-up in: ONE YEAR with Dr. Debara Pickett. You will receive a reminder letter in the mail two months in advance. If you don't receive a letter, please call our office to schedule the follow-up appointment.   If you need a refill on your cardiac medications before your next appointment, please call your pharmacy.  Any Other Special Instructions Will Be Listed Below (If Applicable).

## 2018-02-04 ENCOUNTER — Other Ambulatory Visit
Admission: RE | Admit: 2018-02-04 | Discharge: 2018-02-04 | Disposition: A | Discharge status: Home / Self Care | Payer: BC Managed Care – PPO | Source: Ambulatory Visit | Attending: Internal Medicine | Admitting: Internal Medicine

## 2018-02-04 DIAGNOSIS — E782 Mixed hyperlipidemia: Secondary | ICD-10-CM | POA: Insufficient documentation

## 2018-02-04 LAB — LIPID PANEL
CHOLESTEROL: 178 mg/dL (ref 0–200)
HDL: 50 mg/dL (ref >40)
LDL CALC: 93 mg/dL (ref 0–99)
Total CHOL/HDL Ratio: 3.6 RATIO
Triglycerides: 176 mg/dL — ABNORMAL HIGH (ref <150)
VLDL: 35 mg/dL (ref 0–40)

## 2018-02-10 ENCOUNTER — Telehealth: Payer: Self-pay | Admitting: Internal Medicine

## 2018-02-10 DIAGNOSIS — E782 Mixed hyperlipidemia: Secondary | ICD-10-CM

## 2018-02-10 MED ORDER — ATORVASTATIN CALCIUM 80 MG PO TABS: 80.0000 mg | ORAL_TABLET | Freq: Every day | ORAL | 3 refills | Status: DC

## 2018-02-10 NOTE — Telephone Encounter (Signed)
Notes recorded by Fidel Levy, RN on 02/10/2018 at 10:08 AM EDT Patient aware of results. Agrees w/MD recommendations. Rx(s) sent to pharmacy electronically. Lab order mailed and she will have done at PCP office in approx Dec 2019 ------  Notes recorded by Pixie Casino, MD on 02/04/2018 at 9:16 PM EDT Cholesterol somewhat worse than 2 years ago. Change pravastatin 20 mg to atorvastatin 80 mg daily. Repeat lipid profile in 3 months.  Dr. Lemmie Evens

## 2019-02-05 ENCOUNTER — Other Ambulatory Visit: Payer: Self-pay | Admitting: Internal Medicine

## 2019-02-23 ENCOUNTER — Other Ambulatory Visit: Payer: Self-pay

## 2019-02-23 ENCOUNTER — Encounter: Payer: Self-pay | Admitting: Emergency Medicine

## 2019-02-23 ENCOUNTER — Emergency Department: Payer: BC Managed Care – PPO

## 2019-02-23 ENCOUNTER — Inpatient Hospital Stay
Admission: EM | Admit: 2019-02-23 | Discharge: 2019-02-28 | DRG: 872 | Disposition: A | Discharge status: Home / Self Care | Payer: BC Managed Care – PPO | Attending: Internal Medicine | Admitting: Internal Medicine

## 2019-02-23 DIAGNOSIS — Z825 Family history of asthma and other chronic lower respiratory diseases: Secondary | ICD-10-CM | POA: Diagnosis not present

## 2019-02-23 DIAGNOSIS — Z885 Allergy status to narcotic agent status: Secondary | ICD-10-CM | POA: Diagnosis not present

## 2019-02-23 DIAGNOSIS — Z79891 Long term (current) use of opiate analgesic: Secondary | ICD-10-CM

## 2019-02-23 DIAGNOSIS — I252 Old myocardial infarction: Secondary | ICD-10-CM | POA: Diagnosis not present

## 2019-02-23 DIAGNOSIS — E782 Mixed hyperlipidemia: Secondary | ICD-10-CM | POA: Diagnosis present

## 2019-02-23 DIAGNOSIS — Z8249 Family history of ischemic heart disease and other diseases of the circulatory system: Secondary | ICD-10-CM | POA: Diagnosis not present

## 2019-02-23 DIAGNOSIS — Z955 Presence of coronary angioplasty implant and graft: Secondary | ICD-10-CM

## 2019-02-23 DIAGNOSIS — Z87891 Personal history of nicotine dependence: Secondary | ICD-10-CM

## 2019-02-23 DIAGNOSIS — R509 Fever, unspecified: Secondary | ICD-10-CM | POA: Diagnosis not present

## 2019-02-23 DIAGNOSIS — K7460 Unspecified cirrhosis of liver: Secondary | ICD-10-CM | POA: Diagnosis present

## 2019-02-23 DIAGNOSIS — Z7984 Long term (current) use of oral hypoglycemic drugs: Secondary | ICD-10-CM

## 2019-02-23 DIAGNOSIS — K760 Fatty (change of) liver, not elsewhere classified: Secondary | ICD-10-CM | POA: Diagnosis present

## 2019-02-23 DIAGNOSIS — L03116 Cellulitis of left lower limb: Secondary | ICD-10-CM | POA: Diagnosis present

## 2019-02-23 DIAGNOSIS — M8580 Other specified disorders of bone density and structure, unspecified site: Secondary | ICD-10-CM | POA: Diagnosis present

## 2019-02-23 DIAGNOSIS — Z7982 Long term (current) use of aspirin: Secondary | ICD-10-CM

## 2019-02-23 DIAGNOSIS — Z823 Family history of stroke: Secondary | ICD-10-CM | POA: Diagnosis not present

## 2019-02-23 DIAGNOSIS — D696 Thrombocytopenia, unspecified: Secondary | ICD-10-CM | POA: Diagnosis present

## 2019-02-23 DIAGNOSIS — M069 Rheumatoid arthritis, unspecified: Secondary | ICD-10-CM | POA: Diagnosis present

## 2019-02-23 DIAGNOSIS — I700 Atherosclerosis of aorta: Secondary | ICD-10-CM | POA: Diagnosis present

## 2019-02-23 DIAGNOSIS — I2510 Atherosclerotic heart disease of native coronary artery without angina pectoris: Secondary | ICD-10-CM | POA: Diagnosis present

## 2019-02-23 DIAGNOSIS — A419 Sepsis, unspecified organism: Secondary | ICD-10-CM | POA: Diagnosis not present

## 2019-02-23 DIAGNOSIS — Z20828 Contact with and (suspected) exposure to other viral communicable diseases: Secondary | ICD-10-CM | POA: Diagnosis present

## 2019-02-23 DIAGNOSIS — I10 Essential (primary) hypertension: Secondary | ICD-10-CM | POA: Diagnosis present

## 2019-02-23 DIAGNOSIS — M7989 Other specified soft tissue disorders: Secondary | ICD-10-CM

## 2019-02-23 DIAGNOSIS — E1151 Type 2 diabetes mellitus with diabetic peripheral angiopathy without gangrene: Secondary | ICD-10-CM | POA: Diagnosis present

## 2019-02-23 DIAGNOSIS — Z79899 Other long term (current) drug therapy: Secondary | ICD-10-CM | POA: Diagnosis not present

## 2019-02-23 LAB — CBC WITH DIFFERENTIAL/PLATELET
Abs Immature Granulocytes: 0.14 10*3/uL — ABNORMAL HIGH (ref 0.00–0.07)
Basophils Absolute: 0 10*3/uL (ref 0.0–0.1)
Basophils Relative: 0 %
Eosinophils Absolute: 0 10*3/uL (ref 0.0–0.5)
Eosinophils Relative: 0 %
HCT: 44.8 % (ref 36.0–46.0)
Hemoglobin: 14.4 g/dL (ref 12.0–15.0)
Immature Granulocytes: 1 %
Lymphocytes Relative: 5 %
Lymphs Abs: 0.7 10*3/uL (ref 0.7–4.0)
MCH: 27.1 pg (ref 26.0–34.0)
MCHC: 32.1 g/dL (ref 30.0–36.0)
MCV: 84.2 fL (ref 80.0–100.0)
Monocytes Absolute: 1.1 10*3/uL — ABNORMAL HIGH (ref 0.1–1.0)
Monocytes Relative: 8 %
Neutro Abs: 12 10*3/uL — ABNORMAL HIGH (ref 1.7–7.7)
Neutrophils Relative %: 86 %
Platelets: 136 10*3/uL — ABNORMAL LOW (ref 150–400)
RBC: 5.32 MIL/uL — ABNORMAL HIGH (ref 3.87–5.11)
RDW: 13.7 % (ref 11.5–15.5)
WBC: 14 10*3/uL — ABNORMAL HIGH (ref 4.0–10.5)
nRBC: 0 % (ref 0.0–0.2)

## 2019-02-23 LAB — COMPREHENSIVE METABOLIC PANEL
ALT: 33 U/L (ref 0–44)
AST: 30 U/L (ref 15–41)
Albumin: 3.9 g/dL (ref 3.5–5.0)
Alkaline Phosphatase: 57 U/L (ref 38–126)
Anion gap: 13 (ref 5–15)
BUN: 17 mg/dL (ref 8–23)
CO2: 21 mmol/L — ABNORMAL LOW (ref 22–32)
Calcium: 9.1 mg/dL (ref 8.9–10.3)
Chloride: 101 mmol/L (ref 98–111)
Creatinine, Ser: 0.85 mg/dL (ref 0.44–1.00)
GFR calc Af Amer: >60 mL/min (ref >60)
GFR calc non Af Amer: >60 mL/min (ref >60)
Glucose, Bld: 188 mg/dL — ABNORMAL HIGH (ref 70–99)
Potassium: 3.5 mmol/L (ref 3.5–5.1)
Sodium: 135 mmol/L (ref 135–145)
Total Bilirubin: 0.5 mg/dL (ref 0.3–1.2)
Total Protein: 7.2 g/dL (ref 6.5–8.1)

## 2019-02-23 LAB — LACTIC ACID, PLASMA
Lactic Acid, Venous: 3.4 mmol/L (ref 0.5–1.9)
Lactic Acid, Venous: 2.6 mmol/L (ref 0.5–1.9)

## 2019-02-23 LAB — URINALYSIS, COMPLETE (UACMP) WITH MICROSCOPIC
Bacteria, UA: NONE SEEN
Bilirubin Urine: NEGATIVE
Glucose, UA: >=500 mg/dL — AB
Hgb urine dipstick: NEGATIVE
Ketones, ur: 5 mg/dL — AB
Leukocytes,Ua: NEGATIVE
Nitrite: NEGATIVE
Protein, ur: NEGATIVE mg/dL
Specific Gravity, Urine: 1.029 (ref 1.005–1.030)
pH: 5 (ref 5.0–8.0)

## 2019-02-23 LAB — INFLUENZA PANEL BY PCR (TYPE A & B)
Influenza A By PCR: NEGATIVE
Influenza B By PCR: NEGATIVE

## 2019-02-23 LAB — TROPONIN I (HIGH SENSITIVITY): Troponin I (High Sensitivity): 8 ng/L (ref <18)

## 2019-02-23 LAB — SARS CORONAVIRUS 2 BY RT PCR (HOSPITAL ORDER, PERFORMED IN ~~LOC~~ HOSPITAL LAB): SARS Coronavirus 2: NEGATIVE

## 2019-02-23 MED ORDER — ACETAMINOPHEN 325 MG PO TABS
ORAL_TABLET | ORAL | Status: AC
  Filled 2019-02-23: qty 2

## 2019-02-23 MED ORDER — VANCOMYCIN HCL IN DEXTROSE 1-5 GM/200ML-% IV SOLN
1000.0000 mg | INTRAVENOUS | Status: DC
  Administered 2019-02-24 – 2019-02-26 (×3): 1000 mg via INTRAVENOUS
  Filled 2019-02-23 (×4): qty 200

## 2019-02-23 MED ORDER — ACETAMINOPHEN 650 MG RE SUPP: 650.0000 mg | Freq: Four times a day (QID) | RECTAL | Status: DC | PRN

## 2019-02-23 MED ORDER — SODIUM CHLORIDE 0.9 % IV BOLUS
1000.0000 mL | Freq: Once | INTRAVENOUS | Status: AC
  Administered 2019-02-23: 19:00:00 1000 mL via INTRAVENOUS

## 2019-02-23 MED ORDER — PIPERACILLIN-TAZOBACTAM 3.375 G IVPB
3.3750 g | Freq: Three times a day (TID) | INTRAVENOUS | Status: DC
  Administered 2019-02-23 – 2019-02-27 (×12): 3.375 g via INTRAVENOUS
  Filled 2019-02-23 (×12): qty 50

## 2019-02-23 MED ORDER — SODIUM CHLORIDE 0.9 % IV SOLN
1.0000 g | Freq: Once | INTRAVENOUS | Status: AC
  Administered 2019-02-23: 1 g via INTRAVENOUS
  Filled 2019-02-23: qty 10

## 2019-02-23 MED ORDER — SODIUM CHLORIDE 0.9 % IV BOLUS
1000.0000 mL | Freq: Once | INTRAVENOUS | Status: AC
  Administered 2019-02-23: 1000 mL via INTRAVENOUS

## 2019-02-23 MED ORDER — VANCOMYCIN HCL 10 G IV SOLR
1750.0000 mg | Freq: Once | INTRAVENOUS | Status: AC
  Administered 2019-02-23: 23:00:00 1750 mg via INTRAVENOUS
  Filled 2019-02-23: qty 1750

## 2019-02-23 MED ORDER — IOHEXOL 300 MG/ML  SOLN
100.0000 mL | Freq: Once | INTRAMUSCULAR | Status: AC | PRN
  Administered 2019-02-23: 100 mL via INTRAVENOUS
  Filled 2019-02-23: qty 100

## 2019-02-23 MED ORDER — ACETAMINOPHEN 325 MG PO TABS
650.0000 mg | ORAL_TABLET | Freq: Four times a day (QID) | ORAL | Status: DC | PRN
  Administered 2019-02-23 – 2019-02-28 (×6): 650 mg via ORAL
  Filled 2019-02-23 (×8): qty 2

## 2019-02-23 MED ORDER — ACETAMINOPHEN 325 MG PO TABS
650.0000 mg | ORAL_TABLET | Freq: Once | ORAL | Status: AC | PRN
  Administered 2019-02-23: 650 mg via ORAL

## 2019-02-23 MED ORDER — SODIUM CHLORIDE 0.9 % IV SOLN
INTRAVENOUS | Status: DC
  Administered 2019-02-24 – 2019-02-26 (×5): via INTRAVENOUS

## 2019-02-23 NOTE — ED Notes (Signed)
ED TO INPATIENT HANDOFF REPORT  ED Nurse Name and Phone #: Lashon Beringer [phone]  S Name/Age/Gender Aneta Mins 73 y.o. female Room/Bed: ED42A/ED42A  Code Status   Code Status: Prior  Home/SNF/Other Home Patient oriented to: self, place, time and situation Is this baseline? Yes   Triage Complete: Triage complete  Chief Complaint Fever Cold Flu like symptoms  EMS  Triage Note Pt comes into the ED via EMS from home with c/o sudden onset SOB today, O2 sats 98%, CBG 181. Pt is in NAD. ST 110  PT c/o fever, chills, body aches that started today. Denies cough or SOB. PT A&OX4   Allergies Allergies  Allergen Reactions  . Demerol Other (See Comments)    vomiting  . Peanut-Containing Drug Products     Mouth swelling    Level of Care/Admitting Diagnosis ED Disposition    ED Disposition Condition [facility] Area: [place] [[id number]]  Level of Care: Med-Surg [16]  Covid Evaluation: Confirmed COVID Negative  [facility]: [id number]  Admitting Physician: Harrie Foreman [id number]  Attending Physician: Harrie Foreman [id number]  Estimated length of stay: past midnight tomorrow  Certification:: I certify this patient will need inpatient services for at least 2 midnights  PT Class (Do Not Modify): Inpatient [101]  PT Acc Code (Do Not Modify): Private [1]       B Medical/Surgery History Past Medical History:  Diagnosis Date  . DM type 2 (diabetes mellitus, type 2) ([place])   . Fatty liver disease, nonalcoholic   . Hypertension   . Migraines    "when I was very young; before 1968"  . Neuromuscular disorder ([place])   . NSTEMI (non-ST elevated myocardial infarction) ([place])   . Rheumatoid arthritis(714.0)   . Transverse myelitis ([place]) 1979   w/transient inability to walk   Past Surgical History:  Procedure Laterality Date  . BREAST LUMPECTOMY  1970   right  . COLONOSCOPY N/A 05/11/2013   Procedure: COLONOSCOPY;  Surgeon: Ladene Artist, MD;  [facility] ENDOSCOPY;  Service: Endoscopy;  Laterality: N/A;  . CORONARY ANGIOPLASTY WITH STENT PLACEMENT  10/09/14   Xience DES to RCA  . FRACTURE SURGERY     plates and screws in right ankle  . LEFT HEART CATHETERIZATION WITH CORONARY ANGIOGRAM N/A 10/09/2014   Procedure: LEFT HEART CATHETERIZATION WITH CORONARY ANGIOGRAM;  Surgeon: Leonie Man, MD;  Location: [facility] CATH LAB;  Service: Cardiovascular;  Laterality: N/A;  . ORIF ANKLE FRACTURE  09/2008   right  . TUBAL LIGATION    . VAGINAL HYSTERECTOMY  1982     A IV Location/Drains/Wounds Patient Lines/Drains/Airways Status   Active Line/Drains/Airways    Name:   Placement date:   Placement time:   Site:   Days:   Peripheral IV 02/23/19 Left Forearm   02/23/19    1753    Forearm   less than 1   Peripheral IV 02/23/19 Right Forearm   02/23/19    1955    Forearm   less than 1   Incision (Closed) 01/05/14 Abdomen Right;Upper   01/05/14    1124     1875          Intake/Output Last 24 hours No intake or output data in the 24 hours ending 02/23/19 2328  Labs/Imaging Results for orders placed or performed during the hospital encounter of 02/23/19 (from the past 48 hour(s))  CBC with Differential     Status: Abnormal  Collection Time: 02/23/19  5:50 PM  Result Value Ref Range   WBC 14.0 (H) 4.0 - 10.5 K/uL   RBC 5.32 (H) 3.87 - 5.11 MIL/uL   Hemoglobin 14.4 12.0 - 15.0 g/dL   HCT 44.8 36.0 - 46.0 %   MCV 84.2 80.0 - 100.0 fL   MCH 27.1 26.0 - 34.0 pg   MCHC 32.1 30.0 - 36.0 g/dL   RDW 13.7 11.5 - 15.5 %   Platelets 136 (L) 150 - 400 K/uL   nRBC 0.0 0.0 - 0.2 %   Neutrophils Relative % 86 %   Neutro Abs 12.0 (H) 1.7 - 7.7 K/uL   Lymphocytes Relative 5 %   Lymphs Abs 0.7 0.7 - 4.0 K/uL   Monocytes Relative 8 %   Monocytes Absolute 1.1 (H) 0.1 - 1.0 K/uL   Eosinophils Relative 0 %   Eosinophils Absolute 0.0 0.0 - 0.5 K/uL   Basophils Relative 0 %   Basophils Absolute 0.0 0.0 - 0.1 K/uL   Immature  Granulocytes 1 %   Abs Immature Granulocytes 0.14 (H) 0.00 - 0.07 K/uL    Comment: Performed at [facility], [address] [place], [place] [address]  Comprehensive metabolic panel     Status: Abnormal   Collection Time: 02/23/19  5:50 PM  Result Value Ref Range   Sodium 135 135 - 145 mmol/L   Potassium 3.5 3.5 - 5.1 mmol/L   Chloride 101 98 - 111 mmol/L   CO2 21 (L) 22 - 32 mmol/L   Glucose, Bld 188 (H) 70 - 99 mg/dL   BUN 17 8 - 23 mg/dL   Creatinine, Ser 0.85 0.44 - 1.00 mg/dL   Calcium 9.1 8.9 - 10.3 mg/dL   Total Protein 7.2 6.5 - 8.1 g/dL   Albumin 3.9 3.5 - 5.0 g/dL   AST 30 15 - 41 U/L   ALT 33 0 - 44 U/L   Alkaline Phosphatase 57 38 - 126 U/L   Total Bilirubin 0.5 0.3 - 1.2 mg/dL   GFR calc non Af Amer >60 >60 mL/min   GFR calc Af Amer >60 >60 mL/min   Anion gap 13 5 - 15    Comment: Performed at [facility], [address] [place], [place] [address]  Urinalysis, Complete w Microscopic     Status: Abnormal   Collection Time: 02/23/19  5:50 PM  Result Value Ref Range   Color, Urine YELLOW (A) YELLOW   APPearance CLEAR (A) CLEAR   Specific Gravity, Urine 1.029 1.005 - 1.030   pH 5.0 5.0 - 8.0   Glucose, UA >=500 (A) NEGATIVE mg/dL   Hgb urine dipstick NEGATIVE NEGATIVE   Bilirubin Urine NEGATIVE NEGATIVE   Ketones, ur 5 (A) NEGATIVE mg/dL   Protein, ur NEGATIVE NEGATIVE mg/dL   Nitrite NEGATIVE NEGATIVE   Leukocytes,Ua NEGATIVE NEGATIVE   RBC / HPF 0-5 0 - 5 RBC/hpf   WBC, UA 0-5 0 - 5 WBC/hpf   Bacteria, UA NONE SEEN NONE SEEN   Squamous Epithelial / LPF 0-5 0 - 5    Comment: Performed at [facility], [address] [place], [place] [address]  Troponin I (High Sensitivity)     Status: None   Collection Time: 02/23/19  5:50 PM  Result Value Ref Range   Troponin I (High Sensitivity) 8 <18 ng/L    Comment: (NOTE) Elevated high sensitivity troponin I (hsTnI) values and significant  changes across serial measurements may suggest  ACS but  many other  chronic and acute conditions are known to elevate hsTnI results.  Refer to the "Links" section for chest pain algorithms and additional  guidance. Performed at [facility], [address] [place], [place] [address]   Lactic acid, plasma     Status: Abnormal   Collection Time: 02/23/19  5:50 PM  Result Value Ref Range   Lactic Acid, Venous 3.4 (HH) 0.5 - 1.9 mmol/L    Comment: CRITICAL RESULT CALLED TO, READ BACK BY AND VERIFIED WITH LINDA [facility] 02/23/19 1857 KLW Performed at [facility], [address] [place], [place] [address]   SARS Coronavirus 2 [facility] order, Performed in [facility] hospital lab) Nasopharyngeal Nasopharyngeal Swab     Status: None   Collection Time: 02/23/19  5:50 PM   Specimen: Nasopharyngeal Swab  Result Value Ref Range   SARS Coronavirus 2 NEGATIVE NEGATIVE    Comment: (NOTE) If result is NEGATIVE SARS-CoV-2 target nucleic acids are NOT DETECTED. The SARS-CoV-2 RNA is generally detectable in upper and lower  respiratory specimens during the acute phase of infection. The lowest  concentration of SARS-CoV-2 viral copies this assay can detect is 250  copies / mL. A negative result does not preclude SARS-CoV-2 infection  and should not be used as the sole basis for treatment or other  patient management decisions.  A negative result may occur with  improper specimen collection / handling, submission of specimen other  than nasopharyngeal swab, presence of viral mutation(s) within the  areas targeted by this assay, and inadequate number of viral copies  (<250 copies / mL). A negative result must be combined with clinical  observations, patient history, and epidemiological information. If result is POSITIVE SARS-CoV-2 target nucleic acids are DETECTED. The SARS-CoV-2 RNA is generally detectable in upper and lower  respiratory specimens dur ing the acute phase of infection.  Positive  results are indicative of  active infection with SARS-CoV-2.  Clinical  correlation with patient history and other diagnostic information is  necessary to determine patient infection status.  Positive results do  not rule out bacterial infection or co-infection with other viruses. If result is PRESUMPTIVE POSTIVE SARS-CoV-2 nucleic acids MAY BE PRESENT.   A presumptive positive result was obtained on the submitted specimen  and confirmed on repeat testing.  While 2019 novel coronavirus  (SARS-CoV-2) nucleic acids may be present in the submitted sample  additional confirmatory testing may be necessary for epidemiological  and / or clinical management purposes  to differentiate between  SARS-CoV-2 and other Sarbecovirus currently known to infect humans.  If clinically indicated additional testing with an alternate test  methodology [phone]) is advised. The SARS-CoV-2 RNA is generally  detectable in upper and lower respiratory sp ecimens during the acute  phase of infection. The expected result is Negative. Fact Sheet for Patients:  StrictlyIdeas.no Fact Sheet for Healthcare Providers: BankingDealers.co.za This test is not yet approved or cleared by the [place] FDA and has been authorized for detection and/or diagnosis of SARS-CoV-2 by FDA under an Emergency Use Authorization (EUA).  This EUA will remain in effect (meaning this test can be used) for the duration of the COVID-19 declaration under Section 564(b)(1) of the Act, 21 U.S.C. section 360bbb-3(b)(1), unless the authorization is terminated or revoked sooner. Performed at [facility], [address] [place], [place] [address]   Influenza panel by PCR (type A & B)     Status: None   Collection Time: 02/23/19  7:17 PM  Result Value Ref Range   Influenza A By PCR NEGATIVE NEGATIVE   Influenza B By PCR NEGATIVE NEGATIVE    Comment: (NOTE) The Xpert Xpress Flu assay is intended as an aid in the  diagnosis of  influenza and should not be used as a sole basis for treatment.  This  assay is FDA approved for nasopharyngeal swab specimens only. Nasal  washings and aspirates are unacceptable for Xpert Xpress Flu testing. Performed at [facility], [address] [place], [place] [address]   Lactic acid, plasma     Status: Abnormal   Collection Time: 02/23/19  7:33 PM  Result Value Ref Range   Lactic Acid, Venous 2.6 (HH) 0.5 - 1.9 mmol/L    Comment: CRITICAL RESULT CALLED TO, READ BACK BY AND VERIFIED WITH JENICE MENSHER RN AT 2025 ON 02/23/2019 [facility] Performed at [facility], [address] [place], [place] [address]    Dg Chest 1 View  Result Date: 02/23/2019 CLINICAL DATA:  Concern for COVID-19 EXAM: CHEST  1 VIEW COMPARISON:  04/26/2015 FINDINGS: The heart size and mediastinal contours are within normal limits. Both lungs are clear. The visualized skeletal structures are unremarkable. IMPRESSION: No acute abnormality of the lungs in AP portable projection. Electronically Signed   By: Eddie Candle M.D.   On: 02/23/2019 18:17   Ct Abdomen Pelvis W Contrast  Result Date: 02/23/2019 CLINICAL DATA:  Generalized abdominal pain, fever, chills, body aches EXAM: CT ABDOMEN AND PELVIS WITH CONTRAST TECHNIQUE: Multidetector CT imaging of the abdomen and pelvis was performed using the standard protocol following bolus administration of intravenous contrast. CONTRAST:  17mL OMNIPAQUE IOHEXOL 300 MG/ML  SOLN COMPARISON:  05/10/2013.  Ultrasound 09/06/2015. FINDINGS: Lower chest: calcified visualized right coronary artery. Heart is normal size. Lungs clear. No effusions. Hepatobiliary: Small gallstones within the gallbladder. No focal hepatic abnormality or biliary ductal dilatation. Pancreas: No focal abnormality or ductal dilatation. Spleen: No focal abnormality.  Normal size. Adrenals/Urinary Tract: No adrenal abnormality. No focal renal abnormality. No stones or hydronephrosis.  Urinary bladder is unremarkable. 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 Diffuse severe colonic diverticulosis. No active diverticulitis. Small bowel decompressed, unremarkable. Appendix is normal. Vascular/Lymphatic: Diffuse aortic and iliac atherosclerosis. Focal bulge in the infrarenal aorta measures 3 cm. No adenopathy. Reproductive: Prior hysterectomy.  No adnexal masses. Other: No free fluid or free air. Musculoskeletal: No acute bony abnormality. IMPRESSION: No acute findings in the abdomen or pelvis. Diffuse colonic diverticulosis.  No active diverticulitis. Cholelithiasis.  No CT evidence for cholecystitis. Aortic atherosclerosis.  Coronary artery disease. 3 cm infrarenal abdominal aortic aneurysm.Recommend followup by ultrasound in 3 years. This recommendation follows ACR consensus guidelines: White Paper of the ACR Incidental Findings Committee II on Vascular Findings. J Am Coll Radiol 2013; 10:789-794. Aortic aneurysm NOS (ICD10-I71.9) Electronically Signed   By: Rolm Baptise M.D.   On: 02/23/2019 19:48    Pending Labs Unresulted Labs (From admission, onward)    Start     Ordered   02/23/19 1900  Blood culture (routine x 2)  BLOOD CULTURE X 2,   STAT     02/23/19 1859   Signed and Held  Hemoglobin A1c  Once,   R    Comments: To assess prior glycemic control    Signed and Held   Signed and Held  Creatinine, serum  (enoxaparin (LOVENOX)    CrCl >/= 30 ml/min)  Weekly,   R    Comments: while on enoxaparin  therapy    Signed and Held   Signed and Held  TSH  Add-on,   R     Signed and Held          Vitals/Pain Today's Vitals   02/23/19 2004 02/23/19 2200 02/23/19 2222 02/23/19 2244  BP: (!) 114/54   (!) 113/46  Pulse: (!) 102   (!) 105  Resp: 18     Temp: 99.8 F (37.7 C)  (!) 100.5 F (38.1 C)   TempSrc: Oral  Oral   SpO2: 95%   97%  Weight:      Height:  5' 4.96" (1.65 m)    PainSc: 6        Isolation Precautions Droplet  precaution  Medications Medications  acetaminophen (TYLENOL) tablet 650 mg (650 mg Oral Given 02/23/19 2221)    Or  acetaminophen (TYLENOL) suppository 650 mg ( Rectal See Alternative 02/23/19 2221)  0.9 %  sodium chloride infusion (has no administration in time range)  vancomycin (VANCOCIN) 1,750 mg in sodium chloride 0.9 % 500 mL IVPB (1,750 mg Intravenous New Bag/Given 02/23/19 2241)  piperacillin-tazobactam (ZOSYN) IVPB 3.375 g (3.375 g Intravenous New Bag/Given 02/23/19 2239)  acetaminophen (TYLENOL) tablet 650 mg (650 mg Oral Given 02/23/19 1726)  sodium chloride 0.9 % bolus 1,000 mL (0 mLs Intravenous Stopped 02/23/19 2006)  sodium chloride 0.9 % bolus 1,000 mL (0 mLs Intravenous Stopped 02/23/19 2221)  iohexol (OMNIPAQUE) 300 MG/ML solution 100 mL (100 mLs Intravenous Contrast Given 02/23/19 1932)  cefTRIAXone (ROCEPHIN) 1 g in sodium chloride 0.9 % 100 mL IVPB (0 g Intravenous Stopped 02/23/19 2221)    Mobility walks with person assist Low fall risk   Focused Assessments Cardiac Assessment Handoff:    Lab Results  Component Value Date   TROPONINI 0.08 (H) 10/09/2014   No results found for: DDIMER Does the Patient currently have chest pain? No     R Recommendations: See Admitting Provider Note  Report given to:   Additional Notes:

## 2019-02-23 NOTE — ED Triage Notes (Signed)
Pt comes into the ED via EMS from home with c/o sudden onset SOB today, O2 sats 98%, CBG 181. Pt is in NAD. ST 110

## 2019-02-23 NOTE — ED Triage Notes (Signed)
PT c/o fever, chills, body aches that started today. Denies cough or SOB. PT A&OX4

## 2019-02-23 NOTE — Progress Notes (Signed)
Pharmacy Antibiotic Note  Hayley Hogan is a 73 y.o. female admitted on 02/23/2019 with sepsis.  Pharmacy has been consulted for vanc/zosyn dosing.  Plan: Patient received vanc 1.75g IV load and ceftriaxone 1g IV x 1  Vancomycin 1000 mg IV Q 24 hrs. Goal AUC 400-550. Expected AUC: 500.5 SCr used: 0.85 Cssmin: 11.3  Will start zosyn 3.375g IV q8h and will continue to monitor.  Height: 5' 4.96" (165 cm) Weight: 175 lb (79.4 kg) IBW/kg (Calculated) : 56.91  Temp (24hrs), Avg:100.4 F (38 C), Min:98.6 F (37 C), Max:102.8 F (39.3 C)  Recent Labs  Lab 02/23/19 1750 02/23/19 1933  WBC 14.0*  --   CREATININE 0.85  --   LATICACIDVEN 3.4* 2.6*    Estimated Creatinine Clearance: 64.1 mL/min (by C-G formula based on SCr of 0.85 mg/dL).    Allergies  Allergen Reactions  . Demerol Other (See Comments)    vomiting  . Peanut-Containing Drug Products     Mouth swelling    Thank you for allowing pharmacy to be a part of this patient's care.  Tobie Lords, PharmD, BCPS Clinical Pharmacist 02/23/2019 11:55 PM

## 2019-02-23 NOTE — ED Provider Notes (Addendum)
[facility] Emergency Department Provider Note  ____________________________________________  Time seen: Approximately 7:15 PM  I have reviewed the triage vital signs and the nursing notes.   HISTORY  Chief Complaint URI    HPI Hayley Hogan is a 73 y.o. female with a history of diabetes and hypertension, presents to the emergency department with fever, chills, headache and body aches that started today.  Highest temperature was 103 F at home.  Patient denies shortness of breath, chest tightness and chest pain.  Patient states that she developed some right upper quadrant abdominal pain that has been intermittent over the past several weeks.  Patient states that she cannot lay on her right side.  Patient has had no emesis or diarrhea.  She was around a family member who was exposed to COVID-19.  She denies nasal congestion or nonproductive cough. No other alleviating measures have been attempted.         Past Medical History:  Diagnosis Date  . DM type 2 (diabetes mellitus, type 2) ([place])   . Fatty liver disease, nonalcoholic   . Hypertension   . Migraines    "when I was very young; before 1968"  . Neuromuscular disorder ([place])   . NSTEMI (non-ST elevated myocardial infarction) ([place])   . Rheumatoid arthritis(714.0)   . Transverse myelitis ([place]) 1979   w/transient inability to walk    Patient Active Problem List   Diagnosis Date Noted  . CAD in native artery 02/03/2018  . Mixed hyperlipidemia 12/01/2016  . GERD (gastroesophageal reflux disease) 12/19/2014  . Stented coronary artery   . Pain in the chest   . Acute coronary syndrome ([place])   . PAD (peripheral artery disease) ([place]) 04/27/2014  . Chest pain 03/15/2014  . Elevated coronary artery calcium score 03/15/2014  . Lung nodule 05/11/2013  . Osteopenia 05/11/2013  . Atherosclerosis of aorta ([place]) 05/11/2013  . Cirrhosis of liver without mention of alcohol 05/11/2013  . Thrombocytopenia,  unspecified ([place]) 05/11/2013  . Benign neoplasm of colon 05/11/2013  . Lower GI bleed 05/09/2013  . DM2 (diabetes mellitus, type 2) ([place]) 05/09/2013  . Essential hypertension, benign 05/09/2013  . Acute lower GI bleeding 05/09/2013  . Chronic cholecystitis 02/18/2012  . Rheumatoid arthritis ([place]) 01/07/2012  . Headache(784.0) 10/01/2011    Class: Acute  . Nausea and vomiting 10/01/2011    Class: Acute  . Hypertension 10/01/2011    Class: Acute    Past Surgical History:  Procedure Laterality Date  . BREAST LUMPECTOMY  1970   right  . COLONOSCOPY N/A 05/11/2013   Procedure: COLONOSCOPY;  Surgeon: Ladene Artist, MD;  Location: [facility] ENDOSCOPY;  Service: Endoscopy;  Laterality: N/A;  . CORONARY ANGIOPLASTY WITH STENT PLACEMENT  10/09/14   Xience DES to RCA  . FRACTURE SURGERY     plates and screws in right ankle  . LEFT HEART CATHETERIZATION WITH CORONARY ANGIOGRAM N/A 10/09/2014   Procedure: LEFT HEART CATHETERIZATION WITH CORONARY ANGIOGRAM;  Surgeon: Leonie Man, MD;  Location: [facility] CATH LAB;  Service: Cardiovascular;  Laterality: N/A;  . ORIF ANKLE FRACTURE  09/2008   right  . TUBAL LIGATION    . VAGINAL HYSTERECTOMY  1982    Prior to Admission medications   Medication Sig Start Date End Date Taking? Authorizing Provider  amLODipine (NORVASC) 2.5 MG tablet Take 2.5 mg by mouth daily.    [provider]  aspirin 81 MG tablet Take 1 tablet (81 mg total) by mouth daily. 10/10/14   Candiss Norse,  Margaree Mackintosh, MD  atorvastatin (LIPITOR) 80 MG tablet Take 1 tablet (80 mg total) by mouth daily. CALL FOR APPOINTMENT 02/06/19   Pixie Casino, MD  empagliflozin (JARDIANCE) 10 MG TABS tablet Take 10 mg by mouth daily.    [provider]  losartan (COZAAR) 100 MG tablet Take 100 mg by mouth daily.    [provider]  metFORMIN (GLUCOPHAGE-XR) 500 MG 24 hr tablet Take 1,000 mg by mouth 2 (two) times daily.  11/26/14   [provider]  metoprolol (LOPRESSOR) 100  MG tablet Take 100 mg by mouth 2 (two) times daily.    [provider]  Multiple Vitamin (MULITIVITAMIN WITH MINERALS) TABS Take 1 tablet by mouth daily.    [provider]  nitroGLYCERIN (NITROSTAT) 0.4 MG SL tablet Place 1 tablet (0.4 mg total) under the tongue every 5 (five) minutes as needed for chest pain. 10/30/14   Isaiah Serge, NP  omeprazole (PRILOSEC) 20 MG capsule Take 20 mg by mouth 2 (two) times daily. 08/28/15   [provider]  predniSONE (DELTASONE) 10 MG tablet Take 10 mg by mouth as needed (for flare ups).    [provider]  traMADol (ULTRAM) 50 MG tablet Take 50 mg by mouth every 6 (six) hours as needed for moderate pain.     [provider]  Vitamin D, Ergocalciferol, (DRISDOL) 50000 UNITS CAPS capsule Take 50,000 Units by mouth every 7 (seven) days. Wednesday    [provider]    Allergies Demerol and Peanut-containing drug products  Family History  Problem Relation Age of Onset  . Hypertension Mother   . Cancer Father   . Heart Problems Maternal Grandfather   . Cancer Brother   . Cancer Brother   . Other Brother        GSW  . Stroke Sister        x2  . Hypertension Sister        x2  . COPD Sister     Social History Social History   Tobacco Use  . Smoking status: Former Smoker    Packs/day: 0.20    Years: 35.00    Pack years: 7.00    Types: Cigarettes    Quit date: 10/08/2014    Years since quitting: 4.3  . Smokeless tobacco: Never Used  Substance Use Topics  . Alcohol use: Yes    Alcohol/week: 1.0 standard drinks    Types: 1 Glasses of wine per week  . Drug use: Yes    Types: Marijuana    Comment: "marijuana in the 1960's"     Review of Systems  Constitutional: Patient has fever. Eyes: No visual changes. No discharge ENT: No upper respiratory complaints. Cardiovascular: no chest pain. Respiratory: no cough. No SOB. Gastrointestinal: No abdominal pain.  No nausea, no vomiting.  No  diarrhea.  No constipation. Genitourinary: Negative for dysuria. No hematuria Musculoskeletal: Patient has body aches. Skin: Patient has some mild erythema at left ankle. Neurological: Patient has headache.   ____________________________________________   PHYSICAL EXAM:  VITAL SIGNS: ED Triage Vitals  Enc Vitals Group     BP 02/23/19 1720 136/65     Pulse Rate 02/23/19 1720 (!) 116     Resp 02/23/19 1720 18     Temp 02/23/19 1720 (!) 102.8 F (39.3 C)     Temp Source 02/23/19 1720 Oral     SpO2 02/23/19 1720 96 %     Weight 02/23/19 1724 175 lb (79.4 kg)  Height --      Head Circumference --      Peak Flow --      Pain Score 02/23/19 1720 10     Pain Loc --      Pain Edu? --      Excl. in [place]? --      Constitutional: Alert and oriented. Well appearing and in no acute distress. Eyes: Conjunctivae are normal. PERRL. EOMI. Head: Atraumatic. ENT:      Ears: TMs are pearly.      Nose: No congestion/rhinnorhea.      Mouth/Throat: Mucous membranes are moist.  Neck: No stridor.  No cervical spine tenderness to palpation. Cardiovascular: Normal rate, regular rhythm. Normal S1 and S2.  Good peripheral circulation. Respiratory: Normal respiratory effort without tachypnea or retractions. Lungs CTAB. Good air entry to the bases with no decreased or absent breath sounds. Gastrointestinal: Bowel sounds 4 quadrants. Soft and nontender to palpation. No guarding or rigidity. No palpable masses. No distention. No CVA tenderness. Musculoskeletal: Full range of motion to all extremities. No gross deformities appreciated. Neurologic:  Normal speech and language. No gross focal neurologic deficits are appreciated.  Skin: Patient has mild erythema and edema at left ankle.  Patient states this is chronic. Psychiatric: Mood and affect are normal. Speech and behavior are normal. Patient exhibits appropriate insight and judgement.   ____________________________________________   LABS (all  labs ordered are listed, but only abnormal results are displayed)  Labs Reviewed  CBC WITH DIFFERENTIAL/PLATELET - Abnormal; Notable for the following components:      Result Value   WBC 14.0 (*)    RBC 5.32 (*)    Platelets 136 (*)    Neutro Abs 12.0 (*)    Monocytes Absolute 1.1 (*)    Abs Immature Granulocytes 0.14 (*)    All other components within normal limits  COMPREHENSIVE METABOLIC PANEL - Abnormal; Notable for the following components:   CO2 21 (*)    Glucose, Bld 188 (*)    All other components within normal limits  URINALYSIS, COMPLETE (UACMP) WITH MICROSCOPIC - Abnormal; Notable for the following components:   Color, Urine YELLOW (*)    APPearance CLEAR (*)    Glucose, UA >=500 (*)    Ketones, ur 5 (*)    All other components within normal limits  LACTIC ACID, PLASMA - Abnormal; Notable for the following components:   Lactic Acid, Venous 3.4 (*)    All other components within normal limits  LACTIC ACID, PLASMA - Abnormal; Notable for the following components:   Lactic Acid, Venous 2.6 (*)    All other components within normal limits  SARS CORONAVIRUS 2 (HOSPITAL ORDER, [place] [facility])  CULTURE, BLOOD (ROUTINE X 2)  CULTURE, BLOOD (ROUTINE X 2)  INFLUENZA PANEL BY PCR (TYPE A & B)  TROPONIN I (HIGH SENSITIVITY)  TROPONIN I (HIGH SENSITIVITY)   ____________________________________________  EKG   ____________________________________________  RADIOLOGY I personally viewed and evaluated these images as part of my medical decision making, as well as reviewing the written report by the radiologist.  Dg Chest 1 View  Result Date: 02/23/2019 CLINICAL DATA:  Concern for COVID-19 EXAM: CHEST  1 VIEW COMPARISON:  04/26/2015 FINDINGS: The heart size and mediastinal contours are within normal limits. Both lungs are clear. The visualized skeletal structures are unremarkable. IMPRESSION: No acute abnormality of the lungs in AP portable projection.  Electronically Signed   By: Eddie Candle M.D.   On: 02/23/2019 18:17  Ct Abdomen Pelvis W Contrast  Result Date: 02/23/2019 CLINICAL DATA:  Generalized abdominal pain, fever, chills, body aches EXAM: CT ABDOMEN AND PELVIS WITH CONTRAST TECHNIQUE: Multidetector CT imaging of the abdomen and pelvis was performed using the standard protocol following bolus administration of intravenous contrast. CONTRAST:  162mL OMNIPAQUE IOHEXOL 300 MG/ML  SOLN COMPARISON:  05/10/2013.  Ultrasound 09/06/2015. FINDINGS: Lower chest: calcified visualized right coronary artery. Heart is normal size. Lungs clear. No effusions. Hepatobiliary: Small gallstones within the gallbladder. No focal hepatic abnormality or biliary ductal dilatation. Pancreas: No focal abnormality or ductal dilatation. Spleen: No focal abnormality.  Normal size. Adrenals/Urinary Tract: No adrenal abnormality. No focal renal abnormality. No stones or hydronephrosis. Urinary bladder is unremarkable. 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 Diffuse severe colonic diverticulosis. No active diverticulitis. Small bowel decompressed, unremarkable. Appendix is normal. Vascular/Lymphatic: Diffuse aortic and iliac atherosclerosis. Focal bulge in the infrarenal aorta measures 3 cm. No adenopathy. Reproductive: Prior hysterectomy.  No adnexal masses. Other: No free fluid or free air. Musculoskeletal: No acute bony abnormality. IMPRESSION: No acute findings in the abdomen or pelvis. Diffuse colonic diverticulosis.  No active diverticulitis. Cholelithiasis.  No CT evidence for cholecystitis. Aortic atherosclerosis.  Coronary artery disease. 3 cm infrarenal abdominal aortic aneurysm.Recommend followup by ultrasound in 3 years. This recommendation follows ACR consensus guidelines: White Paper of the ACR Incidental Findings Committee II on Vascular Findings. J Am Coll Radiol 2013; 10:789-794. Aortic aneurysm NOS (ICD10-I71.9)  Electronically Signed   By: Rolm Baptise M.D.   On: 02/23/2019 19:48    ____________________________________________    PROCEDURES  Procedure(s) performed:    Procedures    Medications  cefTRIAXone (ROCEPHIN) 1 g in sodium chloride 0.9 % 100 mL IVPB (1 g Intravenous New Bag/Given 02/23/19 2039)  acetaminophen (TYLENOL) tablet 650 mg (650 mg Oral Given 02/23/19 1726)  sodium chloride 0.9 % bolus 1,000 mL (0 mLs Intravenous Stopped 02/23/19 2006)  sodium chloride 0.9 % bolus 1,000 mL (1,000 mLs Intravenous New Bag/Given 02/23/19 1955)  iohexol (OMNIPAQUE) 300 MG/ML solution 100 mL (100 mLs Intravenous Contrast Given 02/23/19 1932)     ____________________________________________   INITIAL IMPRESSION / ASSESSMENT AND PLAN / ED COURSE  Pertinent labs & imaging results that were available during my care of the patient were reviewed by me and considered in my medical decision making (see chart for details).  Review of the [place] CSRS was performed in accordance of the [place] prior to dispensing any controlled drugs.  Clinical Course as of Feb 22 2046  Thu Feb 23, 2019  1857 Lymphocyte #: 0.7 [JW]    Clinical Course User Index [JW] Vallarie Mare M, PA-C        Assessment and plan Sepsis of unknown origin 73 year old female presents to the emergency department with fever, tachycardia, body aches, headache and weakness that started approximately 24 hours ago.  Patient was febrile and tachycardic at triage.  On physical exam, patient seemed ill.  Patient did have some mild erythema and edema of the left lower extremity which patient states is chronic and unchanged.  Differential diagnosis included sepsis, COVID-19, community-acquired pneumonia, cystitis, cellulitis.  Initial lactic acid was 3.5.  COVID-19 testing was negative.  Chest x-ray revealed no consolidations, opacities or infiltrates.  Leukocytosis was noted on CBC with a left shift.  Urinalysis was noncontributory for  cystitis.  Influenza A and B testing are negative.  Blood cultures are pending at this time.  Patient was given IV Rocephin in  the emergency department.  Prime doc on-call Dr. Marcille Blanco was consulted and patient was accepted for admission. ____________________________________________  FINAL CLINICAL IMPRESSION(S) / ED DIAGNOSES  Final diagnoses:  Sepsis, due to unspecified organism, unspecified whether acute organ dysfunction present ([place])      NEW MEDICATIONS STARTED DURING THIS VISIT:  ED Discharge Orders    None          This chart was dictated using voice recognition software/Dragon. Despite best efforts to proofread, errors can occur which can change the meaning. Any change was purely unintentional.    Lannie Fields, PA-C 02/23/19 2047    Lannie Fields, PA-C 02/23/19 2048    Vanessa [place], MD 02/28/19 1155

## 2019-02-24 LAB — GLUCOSE, CAPILLARY
Glucose-Capillary: 174 mg/dL — ABNORMAL HIGH (ref 70–99)
Glucose-Capillary: 149 mg/dL — ABNORMAL HIGH (ref 70–99)
Glucose-Capillary: 130 mg/dL — ABNORMAL HIGH (ref 70–99)
Glucose-Capillary: 103 mg/dL — ABNORMAL HIGH (ref 70–99)
Glucose-Capillary: 111 mg/dL — ABNORMAL HIGH (ref 70–99)

## 2019-02-24 LAB — TROPONIN I (HIGH SENSITIVITY): Troponin I (High Sensitivity): 9 ng/L (ref <18)

## 2019-02-24 LAB — TSH: TSH: 0.508 u[IU]/mL (ref 0.350–4.500)

## 2019-02-24 MED ORDER — ASPIRIN EC 81 MG PO TBEC
81.0000 mg | DELAYED_RELEASE_TABLET | Freq: Every day | ORAL | Status: DC
  Administered 2019-02-24 – 2019-02-28 (×5): 81 mg via ORAL
  Filled 2019-02-24 (×5): qty 1

## 2019-02-24 MED ORDER — ZOLPIDEM TARTRATE 5 MG PO TABS
5.0000 mg | ORAL_TABLET | Freq: Every evening | ORAL | Status: DC | PRN
  Administered 2019-02-24 – 2019-02-25 (×2): 5 mg via ORAL
  Filled 2019-02-24 (×2): qty 1

## 2019-02-24 MED ORDER — LOSARTAN POTASSIUM 50 MG PO TABS
100.0000 mg | ORAL_TABLET | Freq: Every day | ORAL | Status: DC
  Administered 2019-02-25 – 2019-02-28 (×4): 100 mg via ORAL
  Filled 2019-02-24 (×6): qty 2

## 2019-02-24 MED ORDER — VITAMIN D (ERGOCALCIFEROL) 1.25 MG (50000 UNIT) PO CAPS: 50000.0000 [IU] | ORAL_CAPSULE | ORAL | Status: DC

## 2019-02-24 MED ORDER — METOPROLOL TARTRATE 50 MG PO TABS
100.0000 mg | ORAL_TABLET | Freq: Two times a day (BID) | ORAL | Status: DC
  Administered 2019-02-25 – 2019-02-28 (×7): 100 mg via ORAL
  Filled 2019-02-24 (×10): qty 2

## 2019-02-24 MED ORDER — DOCUSATE SODIUM 100 MG PO CAPS
100.0000 mg | ORAL_CAPSULE | Freq: Two times a day (BID) | ORAL | Status: DC
  Administered 2019-02-25 – 2019-02-28 (×4): 100 mg via ORAL
  Filled 2019-02-24 (×8): qty 1

## 2019-02-24 MED ORDER — TRAMADOL HCL 50 MG PO TABS
50.0000 mg | ORAL_TABLET | Freq: Four times a day (QID) | ORAL | Status: DC | PRN
  Administered 2019-02-24 – 2019-02-25 (×3): 50 mg via ORAL
  Filled 2019-02-24 (×3): qty 1

## 2019-02-24 MED ORDER — ADULT MULTIVITAMIN W/MINERALS CH
1.0000 | ORAL_TABLET | Freq: Every day | ORAL | Status: DC
  Administered 2019-02-24 – 2019-02-28 (×5): 1 via ORAL
  Filled 2019-02-24 (×5): qty 1

## 2019-02-24 MED ORDER — ONDANSETRON HCL 4 MG/2ML IJ SOLN: 4.0000 mg | Freq: Four times a day (QID) | INTRAMUSCULAR | Status: DC | PRN

## 2019-02-24 MED ORDER — NITROGLYCERIN 0.4 MG SL SUBL: 0.4000 mg | SUBLINGUAL_TABLET | SUBLINGUAL | Status: DC | PRN

## 2019-02-24 MED ORDER — INSULIN ASPART 100 UNIT/ML ~~LOC~~ SOLN: 0.0000 [IU] | Freq: Every day | SUBCUTANEOUS | Status: DC

## 2019-02-24 MED ORDER — ONDANSETRON HCL 4 MG PO TABS: 4.0000 mg | ORAL_TABLET | Freq: Four times a day (QID) | ORAL | Status: DC | PRN

## 2019-02-24 MED ORDER — PANTOPRAZOLE SODIUM 40 MG PO TBEC
40.0000 mg | DELAYED_RELEASE_TABLET | Freq: Every day | ORAL | Status: DC
  Administered 2019-02-24 – 2019-02-28 (×5): 40 mg via ORAL
  Filled 2019-02-24 (×5): qty 1

## 2019-02-24 MED ORDER — AMLODIPINE BESYLATE 5 MG PO TABS
2.5000 mg | ORAL_TABLET | Freq: Every day | ORAL | Status: DC
  Administered 2019-02-25 – 2019-02-28 (×4): 2.5 mg via ORAL
  Filled 2019-02-24 (×5): qty 1

## 2019-02-24 MED ORDER — ATORVASTATIN CALCIUM 80 MG PO TABS
80.0000 mg | ORAL_TABLET | Freq: Every day | ORAL | Status: DC
  Administered 2019-02-24 – 2019-02-27 (×4): 80 mg via ORAL
  Filled 2019-02-24 (×4): qty 1

## 2019-02-24 MED ORDER — ENOXAPARIN SODIUM 40 MG/0.4ML ~~LOC~~ SOLN
40.0000 mg | SUBCUTANEOUS | Status: DC
  Administered 2019-02-24 – 2019-02-26 (×3): 40 mg via SUBCUTANEOUS
  Filled 2019-02-24 (×3): qty 0.4

## 2019-02-24 MED ORDER — INSULIN ASPART 100 UNIT/ML ~~LOC~~ SOLN
0.0000 [IU] | Freq: Three times a day (TID) | SUBCUTANEOUS | Status: DC
  Administered 2019-02-24 – 2019-02-26 (×4): 1 [IU] via SUBCUTANEOUS
  Administered 2019-02-27: 2 [IU] via SUBCUTANEOUS
  Administered 2019-02-27: 1 [IU] via SUBCUTANEOUS
  Filled 2019-02-24 (×7): qty 1

## 2019-02-24 NOTE — Progress Notes (Signed)
Notify Dr. Posey Pronto about patient's weakness asked if we can consult PT, order given. Also patient is requesting PRN medication for sleep, ordered given for Ambien. RN will continue to monitor.

## 2019-02-24 NOTE — H&P (Signed)
Hayley Hogan is an 73 y.o. female.   Chief Complaint: Fever HPI: The patient with past medical history of diabetes, coronary artery disease and hypertension presents to the emergency department with fever.  There is 102.8 F.  She denies nausea, vomiting or diarrhea.  She denies abdominal pain or shortness of breath.  She admits to having a family member who had been exposed to individuals at work with COVID-19.  Her immediate associates have been symptom free.  The patient also denies loss of taste or smell but admits to general malaise and body aches.  Laboratory evaluation in the emergency department was significant for negative novel coronavirus assay as well as negative influenza assay.  However, the patient was found to have elevated lactic acid as well as leukocytosis which prompted initiation of sepsis protocol prior to the emergency department staff calling hospitalist service for admission.  Past Medical History:  Diagnosis Date  . DM type 2 (diabetes mellitus, type 2) ([place])   . Fatty liver disease, nonalcoholic   . Hypertension   . Migraines    "when I was very young; before 1968"  . Neuromuscular disorder ([place])   . NSTEMI (non-ST elevated myocardial infarction) ([place])   . Rheumatoid arthritis(714.0)   . Transverse myelitis ([place]) 1979   w/transient inability to walk    Past Surgical History:  Procedure Laterality Date  . BREAST LUMPECTOMY  1970   right  . COLONOSCOPY N/A 05/11/2013   Procedure: COLONOSCOPY;  Surgeon: Ladene Artist, MD;  Location: [facility] ENDOSCOPY;  Service: Endoscopy;  Laterality: N/A;  . CORONARY ANGIOPLASTY WITH STENT PLACEMENT  10/09/14   Xience DES to RCA  . FRACTURE SURGERY     plates and screws in right ankle  . LEFT HEART CATHETERIZATION WITH CORONARY ANGIOGRAM N/A 10/09/2014   Procedure: LEFT HEART CATHETERIZATION WITH CORONARY ANGIOGRAM;  Surgeon: Leonie Man, MD;  Location: [facility] CATH LAB;  Service: Cardiovascular;  Laterality: N/A;  . ORIF ANKLE  FRACTURE  09/2008   right  . TUBAL LIGATION    . VAGINAL HYSTERECTOMY  1982    Family History  Problem Relation Age of Onset  . Hypertension Mother   . Cancer Father   . Heart Problems Maternal Grandfather   . Cancer Brother   . Cancer Brother   . Other Brother        GSW  . Stroke Sister        x2  . Hypertension Sister        x2  . COPD Sister    Social History:  reports that she quit smoking about 4 years ago. Her smoking use included cigarettes. She has a 7.00 pack-year smoking history. She has never used smokeless tobacco. She reports current alcohol use of about 1.0 standard drinks of alcohol per week. She reports current drug use. Drug: Marijuana.  Allergies:  Allergies  Allergen Reactions  . Demerol Other (See Comments)    vomiting  . Peanut-Containing Drug Products     Mouth swelling    Medications Prior to Admission  Medication Sig Dispense Refill  . amLODipine (NORVASC) 2.5 MG tablet Take 2.5 mg by mouth daily.    Marland Kitchen aspirin 81 MG tablet Take 1 tablet (81 mg total) by mouth daily. 30 tablet 3  . atorvastatin (LIPITOR) 80 MG tablet Take 1 tablet (80 mg total) by mouth daily. CALL FOR APPOINTMENT 40 tablet 0  . b complex vitamins capsule Take 1 capsule by mouth daily.    . empagliflozin (  JARDIANCE) 25 MG TABS tablet Take 25 mg by mouth daily.     Marland Kitchen losartan (COZAAR) 100 MG tablet Take 100 mg by mouth daily.    . metFORMIN (GLUCOPHAGE-XR) 500 MG 24 hr tablet Take 1,000 mg by mouth 2 (two) times daily.   2  . metoprolol (LOPRESSOR) 100 MG tablet Take 100 mg by mouth 2 (two) times daily.    . nitroGLYCERIN (NITROSTAT) 0.4 MG SL tablet Place 1 tablet (0.4 mg total) under the tongue every 5 (five) minutes as needed for chest pain. 90 tablet 3  . omeprazole (PRILOSEC) 20 MG capsule Take 20 mg by mouth 2 (two) times daily.  4  . predniSONE (DELTASONE) 10 MG tablet Take 10 mg by mouth as needed (for flare ups).    . traMADol (ULTRAM) 50 MG tablet Take 50 mg by mouth every  6 (six) hours as needed for moderate pain.     . Vitamin D, Ergocalciferol, (DRISDOL) 50000 UNITS CAPS capsule Take 50,000 Units by mouth every 7 (seven) days. Wednesday      Results for orders placed or performed during the hospital encounter of 02/23/19 (from the past 48 hour(s))  CBC with Differential     Status: Abnormal   Collection Time: 02/23/19  5:50 PM  Result Value Ref Range   WBC 14.0 (H) 4.0 - 10.5 K/uL   RBC 5.32 (H) 3.87 - 5.11 MIL/uL   Hemoglobin 14.4 12.0 - 15.0 g/dL   HCT 44.8 36.0 - 46.0 %   MCV 84.2 80.0 - 100.0 fL   MCH 27.1 26.0 - 34.0 pg   MCHC 32.1 30.0 - 36.0 g/dL   RDW 13.7 11.5 - 15.5 %   Platelets 136 (L) 150 - 400 K/uL   nRBC 0.0 0.0 - 0.2 %   Neutrophils Relative % 86 %   Neutro Abs 12.0 (H) 1.7 - 7.7 K/uL   Lymphocytes Relative 5 %   Lymphs Abs 0.7 0.7 - 4.0 K/uL   Monocytes Relative 8 %   Monocytes Absolute 1.1 (H) 0.1 - 1.0 K/uL   Eosinophils Relative 0 %   Eosinophils Absolute 0.0 0.0 - 0.5 K/uL   Basophils Relative 0 %   Basophils Absolute 0.0 0.0 - 0.1 K/uL   Immature Granulocytes 1 %   Abs Immature Granulocytes 0.14 (H) 0.00 - 0.07 K/uL    Comment: Performed at [facility], [address] [place], [place] [address]  Comprehensive metabolic panel     Status: Abnormal   Collection Time: 02/23/19  5:50 PM  Result Value Ref Range   Sodium 135 135 - 145 mmol/L   Potassium 3.5 3.5 - 5.1 mmol/L   Chloride 101 98 - 111 mmol/L   CO2 21 (L) 22 - 32 mmol/L   Glucose, Bld 188 (H) 70 - 99 mg/dL   BUN 17 8 - 23 mg/dL   Creatinine, Ser 0.85 0.44 - 1.00 mg/dL   Calcium 9.1 8.9 - 10.3 mg/dL   Total Protein 7.2 6.5 - 8.1 g/dL   Albumin 3.9 3.5 - 5.0 g/dL   AST 30 15 - 41 U/L   ALT 33 0 - 44 U/L   Alkaline Phosphatase 57 38 - 126 U/L   Total Bilirubin 0.5 0.3 - 1.2 mg/dL   GFR calc non Af Amer >60 >60 mL/min   GFR calc Af Amer >60 >60 mL/min   Anion gap 13 5 - 15    Comment: Performed at [facility], [address]  [place], [place] [address]  Urinalysis, Complete w Microscopic     Status: Abnormal   Collection Time: 02/23/19  5:50 PM  Result Value Ref Range   Color, Urine YELLOW (A) YELLOW   APPearance CLEAR (A) CLEAR   Specific Gravity, Urine 1.029 1.005 - 1.030   pH 5.0 5.0 - 8.0   Glucose, UA >=500 (A) NEGATIVE mg/dL   Hgb urine dipstick NEGATIVE NEGATIVE   Bilirubin Urine NEGATIVE NEGATIVE   Ketones, ur 5 (A) NEGATIVE mg/dL   Protein, ur NEGATIVE NEGATIVE mg/dL   Nitrite NEGATIVE NEGATIVE   Leukocytes,Ua NEGATIVE NEGATIVE   RBC / HPF 0-5 0 - 5 RBC/hpf   WBC, UA 0-5 0 - 5 WBC/hpf   Bacteria, UA NONE SEEN NONE SEEN   Squamous Epithelial / LPF 0-5 0 - 5    Comment: Performed at [facility], [place], [place] [address]  Troponin I (High Sensitivity)     Status: None   Collection Time: 02/23/19  5:50 PM  Result Value Ref Range   Troponin I (High Sensitivity) 8 <18 ng/L    Comment: (NOTE) Elevated high sensitivity troponin I (hsTnI) values and significant  changes across serial measurements may suggest ACS but many other  chronic and acute conditions are known to elevate hsTnI results.  Refer to the "Links" section for chest pain algorithms and additional  guidance. Performed at [facility], [address] [place],  [address]   Lactic acid, plasma     Status: Abnormal   Collection Time: 02/23/19  5:50 PM  Result Value Ref Range   Lactic Acid, Venous 3.4 (HH) 0.5 - 1.9 mmol/L    Comment: CRITICAL RESULT CALLED TO, READ BACK BY AND VERIFIED WITH LINDA [facility] 02/23/19 1857 KLW Performed at [facility] [address] [place],  [address]   SARS Coronavirus 2 [facility] order, Performed in [facility] hospital lab) Nasopharyngeal Nasopharyngeal Swab     Status: None   Collection Time: 02/23/19  5:50 PM   Specimen: Nasopharyngeal Swab  Result Value Ref Range   SARS Coronavirus 2 NEGATIVE NEGATIVE    Comment: (NOTE) If result is  NEGATIVE SARS-CoV-2 target nucleic acids are NOT DETECTED. The SARS-CoV-2 RNA is generally detectable in upper and lower  respiratory specimens during the acute phase of infection. The lowest  concentration of SARS-CoV-2 viral copies this assay can detect is 250  copies / mL. A negative result does not preclude SARS-CoV-2 infection  and should not be used as the sole basis for treatment or other  patient management decisions.  A negative result may occur with  improper specimen collection / handling, submission of specimen other  than nasopharyngeal swab, presence of viral mutation(s) within the  areas targeted by this assay, and inadequate number of viral copies  (<250 copies / mL). A negative result must be combined with clinical  observations, patient history, and epidemiological information. If result is POSITIVE SARS-CoV-2 target nucleic acids are DETECTED. The SARS-CoV-2 RNA is generally detectable in upper and lower  respiratory specimens dur ing the acute phase of infection.  Positive  results are indicative of active infection with SARS-CoV-2.  Clinical  correlation with patient history and other diagnostic information is  necessary to determine patient infection status.  Positive results do  not rule out bacterial infection or co-infection with other viruses. If result is PRESUMPTIVE POSTIVE SARS-CoV-2 nucleic acids MAY BE PRESENT.   A presumptive positive result was obtained on the submitted specimen  and confirmed on repeat testing.  While 2019 novel coronavirus  (SARS-CoV-2) nucleic acids may be present in the submitted sample  additional confirmatory testing may be necessary for epidemiological  and / or clinical management purposes  to differentiate between  SARS-CoV-2 and other Sarbecovirus currently known to infect humans.  If clinically indicated additional testing with an alternate test  methodology [phone]) is advised. The SARS-CoV-2 RNA is generally  detectable  in upper and lower respiratory sp ecimens during the acute  phase of infection. The expected result is Negative. Fact Sheet for Patients:  StrictlyIdeas.no Fact Sheet for Healthcare Providers: BankingDealers.co.za This test is not yet approved or cleared by the [place] FDA and has been authorized for detection and/or diagnosis of SARS-CoV-2 by FDA under an Emergency Use Authorization (EUA).  This EUA will remain in effect (meaning this test can be used) for the duration of the COVID-19 declaration under Section 564(b)(1) of the Act, 21 U.S.C. section 360bbb-3(b)(1), unless the authorization is terminated or revoked sooner. Performed at [facility], [address] [place], [place] [address]   Influenza panel by PCR (type A & B)     Status: None   Collection Time: 02/23/19  7:17 PM  Result Value Ref Range   Influenza A By PCR NEGATIVE NEGATIVE   Influenza B By PCR NEGATIVE NEGATIVE    Comment: (NOTE) The Xpert Xpress Flu assay is intended as an aid in the diagnosis of  influenza and should not be used as a sole basis for treatment.  This  assay is FDA approved for nasopharyngeal swab specimens only. Nasal  washings and aspirates are unacceptable for Xpert Xpress Flu testing. Performed at [facility], [address] [place], [place] [address]   Lactic acid, plasma     Status: Abnormal   Collection Time: 02/23/19  7:33 PM  Result Value Ref Range   Lactic Acid, Venous 2.6 (HH) 0.5 - 1.9 mmol/L    Comment: CRITICAL RESULT CALLED TO, READ BACK BY AND VERIFIED WITH JENICE MENSHER RN AT 2025 ON 02/23/2019 SNG Performed at [facility], [address] [place], [place] [address]   Glucose, capillary     Status: Abnormal   Collection Time: 02/24/19  1:02 AM  Result Value Ref Range   Glucose-Capillary 174 (H) 70 - 99 mg/dL  Troponin I (High Sensitivity)     Status: None   Collection Time: 02/24/19   1:31 AM  Result Value Ref Range   Troponin I (High Sensitivity) 9 <18 ng/L    Comment: (NOTE) Elevated high sensitivity troponin I (hsTnI) values and significant  changes across serial measurements may suggest ACS but many other  chronic and acute conditions are known to elevate hsTnI results.  Refer to the "Links" section for chest pain algorithms and additional  guidance. Performed at [facility], [address] [place], [place] [address]   TSH     Status: None   Collection Time: 02/24/19  1:31 AM  Result Value Ref Range   TSH 0.508 0.350 - 4.500 uIU/mL    Comment: Performed by a 3rd Generation assay with a functional sensitivity of <=0.01 uIU/mL. Performed at [facility], [address] [place], [place] [address]    Dg Chest 1 View  Result Date: 02/23/2019 CLINICAL DATA:  Concern for COVID-19 EXAM: CHEST  1 VIEW COMPARISON:  04/26/2015 FINDINGS: The heart size and mediastinal contours are within normal limits. Both lungs are clear. The visualized skeletal structures are  unremarkable. IMPRESSION: No acute abnormality of the lungs in AP portable projection. Electronically Signed   By: Eddie Candle M.D.   On: 02/23/2019 18:17   Ct Abdomen Pelvis W Contrast  Result Date: 02/23/2019 CLINICAL DATA:  Generalized abdominal pain, fever, chills, body aches EXAM: CT ABDOMEN AND PELVIS WITH CONTRAST TECHNIQUE: Multidetector CT imaging of the abdomen and pelvis was performed using the standard protocol following bolus administration of intravenous contrast. CONTRAST:  159mL OMNIPAQUE IOHEXOL 300 MG/ML  SOLN COMPARISON:  05/10/2013.  Ultrasound 09/06/2015. FINDINGS: Lower chest: calcified visualized right coronary artery. Heart is normal size. Lungs clear. No effusions. Hepatobiliary: Small gallstones within the gallbladder. No focal hepatic abnormality or biliary ductal dilatation. Pancreas: No focal abnormality or ductal dilatation. Spleen: No focal abnormality.  Normal size.  Adrenals/Urinary Tract: No adrenal abnormality. No focal renal abnormality. No stones or hydronephrosis. Urinary bladder is unremarkable. 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 Diffuse severe colonic diverticulosis. No active diverticulitis. Small bowel decompressed, unremarkable. Appendix is normal. Vascular/Lymphatic: Diffuse aortic and iliac atherosclerosis. Focal bulge in the infrarenal aorta measures 3 cm. No adenopathy. Reproductive: Prior hysterectomy.  No adnexal masses. Other: No free fluid or free air. Musculoskeletal: No acute bony abnormality. IMPRESSION: No acute findings in the abdomen or pelvis. Diffuse colonic diverticulosis.  No active diverticulitis. Cholelithiasis.  No CT evidence for cholecystitis. Aortic atherosclerosis.  Coronary artery disease. 3 cm infrarenal abdominal aortic aneurysm.Recommend followup by ultrasound in 3 years. This recommendation follows ACR consensus guidelines: White Paper of the ACR Incidental Findings Committee II on Vascular Findings. J Am Coll Radiol 2013; 10:789-794. Aortic aneurysm NOS (ICD10-I71.9) Electronically Signed   By: Rolm Baptise M.D.   On: 02/23/2019 19:48    Review of Systems  Constitutional: Positive for chills, fever and malaise/fatigue.  HENT: Negative for sinus pain, sore throat and tinnitus.   Eyes: Negative for blurred vision and redness.  Respiratory: Negative for cough and shortness of breath.   Cardiovascular: Negative for chest pain, palpitations, orthopnea and PND.  Gastrointestinal: Negative for abdominal pain, diarrhea, nausea and vomiting.  Genitourinary: Negative for dysuria, frequency and urgency.  Musculoskeletal: Negative for joint pain and myalgias.  Skin: Negative for rash.       No lesions  Neurological: Negative for speech change, focal weakness and weakness.  Endo/Heme/Allergies: Does not bruise/bleed easily.       No temperature intolerance  Psychiatric/Behavioral:  Negative for depression and suicidal ideas.    Blood pressure 116/62, pulse (!) 101, temperature 99.7 F (37.6 C), temperature source Oral, resp. rate 18, height 5' 4.96" (1.65 m), weight 79.4 kg, SpO2 100 %. Physical Exam  Vitals reviewed. Constitutional: She is oriented to person, place, and time. She appears well-developed and well-nourished. No distress.  HENT:  Head: Normocephalic and atraumatic.  Mouth/Throat: Oropharynx is clear and moist.  Eyes: Pupils are equal, round, and reactive to light. Conjunctivae and EOM are normal. No scleral icterus.  Neck: Normal range of motion. Neck supple. No JVD present. No tracheal deviation present. No thyromegaly present.  Cardiovascular: Normal rate, regular rhythm and normal heart sounds. Exam reveals no gallop and no friction rub.  No murmur heard. Respiratory: Effort normal and breath sounds normal.  GI: Soft. Bowel sounds are normal. She exhibits no distension. There is no abdominal tenderness.  Genitourinary:    Genitourinary Comments: Deferred   Musculoskeletal: Normal range of motion.        General: Edema (Left lower leg) present.  Lymphadenopathy:  She has no cervical adenopathy.  Neurological: She is alert and oriented to person, place, and time. No cranial nerve deficit. She exhibits normal muscle tone.  Skin: Skin is warm and dry. No rash noted. No erythema.  Psychiatric: She has a normal mood and affect. Her behavior is normal. Judgment and thought content normal.     Assessment/Plan This is a 73 year old female admitted for sepsis. 1.  Sepsis: Patient meets criteria via fever, tachycardia, leukocytosis and lactic acidosis.  She is hemodynamically stable.  Source is not clear at this time although the patient does have an area of possible cellulitis on her left lower extremity.  However the patient states that her leg has looked like this for years and that current appearance is not new.  Nonetheless, continue fluid  resuscitation as well as Vanco and Zosyn.  Follow blood cultures for growth and sensitivities. 2.  Coronary artery disease: Stable; pain.  Continue aspirin. 3.  Hypertension: Uncontrolled; continue losartan, metoprolol and amlodipine.  Labetalol as needed. 4.  Diabetes mellitus type 2: Hold oral hypoglycemic agents.  Sliding scale insulin while hospitalized. 5.  Hyperlipidemia: Continue statin therapy 6.  DVT prophylaxis: Lovenox 7.  GI prophylaxis: Pantoprazole per home regimen The patient is a full code.  Time spent on admission orders and patient care approximately 45 minutes  Harrie Foreman, MD 02/24/2019, 3:43 AM

## 2019-02-24 NOTE — Progress Notes (Signed)
Pharmacy Antibiotic Note  Hayley Hogan is a 73 y.o. female admitted on 02/23/2019 with sepsis.  Pharmacy has been consulted for vanc/zosyn dosing.  Plan: Patient received vanc 1.75g IV load and ceftriaxone 1g IV x 1   Continue Vancomycin 1000 mg IV Q 24 hrs. Goal AUC 400-550. Expected AUC: 500.5 SCr used: 0.85 Cssmin: 11.3  Continue zosyn 3.375g IV q8h and will continue to monitor.  Height: 5' 4.96" (165 cm) Weight: 175 lb 0.7 oz (79.4 kg) IBW/kg (Calculated) : 56.91  Temp (24hrs), Avg:100.4 F (38 C), Min:98.6 F (37 C), Max:102.8 F (39.3 C)  Recent Labs  Lab 02/23/19 1750 02/23/19 1933  WBC 14.0*  --   CREATININE 0.85  --   LATICACIDVEN 3.4* 2.6*    Estimated Creatinine Clearance: 64.1 mL/min (by C-G formula based on SCr of 0.85 mg/dL).    Allergies  Allergen Reactions  . Demerol Other (See Comments)    vomiting  . Peanut-Containing Drug Products     Mouth swelling    Thank you for allowing pharmacy to be a part of this patient's care.  Lu Duffel, PharmD, BCPS Clinical Pharmacist 02/24/2019 10:57 AM

## 2019-02-24 NOTE — Progress Notes (Addendum)
[place] at [facility]                                                                                                                                                                                  Patient Demographics   Hayley Hogan, is a 73 y.o. female, DOB - 06-Aug-1945, [id number]  Admit date - 02/23/2019   Admitting Physician Harrie Foreman, MD  Outpatient Primary MD for the patient is Leanna Battles, MD   LOS - 1  Subjective: Patient continues to complain of pain all over, denies any coughing States that the left lower leg was more swollen yesterday it is noted to be red and swollen currently Patient states that she has had multiple Dopplers on the leg showing no DVT in the past  Review of Systems:   CONSTITUTIONAL: No documented fever.  Positive fatigue, positive weakness. No weight gain, no weight loss.  EYES: No blurry or double vision.  ENT: No tinnitus. No postnasal drip. No redness of the oropharynx.  RESPIRATORY: No cough, no wheeze, no hemoptysis. No dyspnea.  CARDIOVASCULAR: No chest pain. No orthopnea. No palpitations. No syncope.  GASTROINTESTINAL: No nausea, no vomiting or diarrhea. No abdominal pain. No melena or hematochezia.  GENITOURINARY: No dysuria or hematuria.  ENDOCRINE: No polyuria or nocturia. No heat or cold intolerance.  HEMATOLOGY: No anemia. No bruising. No bleeding.  INTEGUMENTARY: No rashes. No lesions.  MUSCULOSKELETAL: No arthritis. No swelling. No gout.  NEUROLOGIC: No numbness, tingling, or ataxia. No seizure-type activity.  PSYCHIATRIC: No anxiety. No insomnia. No ADD.    Vitals:   Vitals:   02/24/19 0556 02/24/19 0648 02/24/19 0725 02/24/19 1017  BP: 127/70  (!) 110/58 110/62  Pulse: (!) 104  96 87  Resp: 18  19   Temp: (!) 102.5 F (39.2 C) 100.2 F (37.9 C) 98.7 F (37.1 C)   TempSrc: Oral Oral Oral   SpO2: 93%  94% 95%  Weight: 79.4 kg     Height:        Wt Readings from Last 3  Encounters:  02/24/19 79.4 kg  02/03/18 80.7 kg  12/01/17 81.6 kg     Intake/Output Summary (Last 24 hours) at 02/24/2019 1244 Last data filed at 02/24/2019 1100 Gross per 24 hour  Intake 1699.98 ml  Output 0 ml  Net 1699.98 ml    Physical Exam:   GENERAL: Pleasant-appearing in no apparent distress.  HEAD, EYES, EARS, NOSE AND THROAT: Atraumatic, normocephalic. Extraocular muscles are intact. Pupils equal and reactive to light. Sclerae anicteric. No conjunctival injection. No oro-pharyngeal erythema.  NECK: Supple. There is no jugular venous distention. No bruits, no lymphadenopathy, no thyromegaly.  HEART: Regular rate and rhythm,. No murmurs, no rubs, no clicks.  LUNGS: Clear to auscultation bilaterally. No rales or rhonchi. No wheezes.  ABDOMEN: Soft, flat, nontender, nondistended. Has good bowel sounds. No hepatosplenomegaly appreciated.  EXTREMITIES: Left leg swollen warm to touch NEUROLOGIC: The patient is alert, awake, and oriented x3 with no focal motor or sensory deficits appreciated bilaterally.  SKIN: Moist and warm with no rashes appreciated.  Psych: Not anxious, depressed LN: No inguinal LN enlargement    Antibiotics   Anti-infectives (From admission, onward)   Start     Dose/Rate Route Frequency Ordered Stop   02/24/19 2300  vancomycin (VANCOCIN) IVPB 1000 mg/200 mL premix     1,000 mg 200 mL/hr over 60 Minutes Intravenous Every 24 hours 02/23/19 2354     02/23/19 2215  vancomycin (VANCOCIN) 1,750 mg in sodium chloride 0.9 % 500 mL IVPB     1,750 mg 250 mL/hr over 120 Minutes Intravenous  Once 02/23/19 2214 02/24/19 0239   02/23/19 2215  piperacillin-tazobactam (ZOSYN) IVPB 3.375 g     3.375 g 12.5 mL/hr over 240 Minutes Intravenous Every 8 hours 02/23/19 2214     02/23/19 2045  cefTRIAXone (ROCEPHIN) 1 g in sodium chloride 0.9 % 100 mL IVPB     1 g 200 mL/hr over 30 Minutes Intravenous  Once 02/23/19 2031 02/23/19 2221      Medications   Scheduled  Meds: . amLODipine  2.5 mg Oral Daily  . aspirin EC  81 mg Oral Daily  . atorvastatin  80 mg Oral Daily  . docusate sodium  100 mg Oral BID  . enoxaparin (LOVENOX) injection  40 mg Subcutaneous Q24H  . insulin aspart  0-5 Units Subcutaneous QHS  . insulin aspart  0-9 Units Subcutaneous TID WC  . losartan  100 mg Oral Daily  . metoprolol tartrate  100 mg Oral BID  . multivitamin with minerals  1 tablet Oral Daily  . pantoprazole  40 mg Oral Daily  . [START ON 03/01/2019] Vitamin D (Ergocalciferol)  50,000 Units Oral Q7 days   Continuous Infusions: . sodium chloride 125 mL/hr at 02/24/19 1100  . piperacillin-tazobactam (ZOSYN)  IV Stopped (02/24/19 0915)  . vancomycin     PRN Meds:.acetaminophen **OR** acetaminophen, nitroGLYCERIN, ondansetron **OR** ondansetron (ZOFRAN) IV, traMADol   Data Review:   Micro Results Recent Results (from the past 240 hour(s))  SARS Coronavirus 2 [facility] order, Performed in [facility] hospital lab) Nasopharyngeal Nasopharyngeal Swab     Status: None   Collection Time: 02/23/19  5:50 PM   Specimen: Nasopharyngeal Swab  Result Value Ref Range Status   SARS Coronavirus 2 NEGATIVE NEGATIVE Final    Comment: (NOTE) If result is NEGATIVE SARS-CoV-2 target nucleic acids are NOT DETECTED. The SARS-CoV-2 RNA is generally detectable in upper and lower  respiratory specimens during the acute phase of infection. The lowest  concentration of SARS-CoV-2 viral copies this assay can detect is 250  copies / mL. A negative result does not preclude SARS-CoV-2 infection  and should not be used as the sole basis for treatment or other  patient management decisions.  A negative result may occur with  improper specimen collection / handling, submission of specimen other  than nasopharyngeal swab, presence of viral mutation(s) within the  areas targeted by this assay, and inadequate number of viral copies  (<250 copies / mL). A negative result must be combined with  clinical  observations, patient history, and epidemiological information. If result is POSITIVE  SARS-CoV-2 target nucleic acids are DETECTED. The SARS-CoV-2 RNA is generally detectable in upper and lower  respiratory specimens dur ing the acute phase of infection.  Positive  results are indicative of active infection with SARS-CoV-2.  Clinical  correlation with patient history and other diagnostic information is  necessary to determine patient infection status.  Positive results do  not rule out bacterial infection or co-infection with other viruses. If result is PRESUMPTIVE POSTIVE SARS-CoV-2 nucleic acids MAY BE PRESENT.   A presumptive positive result was obtained on the submitted specimen  and confirmed on repeat testing.  While 2019 novel coronavirus  (SARS-CoV-2) nucleic acids may be present in the submitted sample  additional confirmatory testing may be necessary for epidemiological  and / or clinical management purposes  to differentiate between  SARS-CoV-2 and other Sarbecovirus currently known to infect humans.  If clinically indicated additional testing with an alternate test  methodology [id number]) is advised. The SARS-CoV-2 RNA is generally  detectable in upper and lower respiratory sp ecimens during the acute  phase of infection. The expected result is Negative. Fact Sheet for Patients:  StrictlyIdeas.no Fact Sheet for Healthcare Providers: BankingDealers.co.za This test is not yet approved or cleared by the [place] FDA and has been authorized for detection and/or diagnosis of SARS-CoV-2 by FDA under an Emergency Use Authorization (EUA).  This EUA will remain in effect (meaning this test can be used) for the duration of the COVID-19 declaration under Section 564(b)(1) of the Act, 21 U.S.C. section 360bbb-3(b)(1), unless the authorization is terminated or revoked sooner. Performed at [facility], [address] [place], [place] [address]   Blood culture (routine x 2)     Status: None (Preliminary result)   Collection Time: 02/23/19  7:28 PM   Specimen: BLOOD  Result Value Ref Range Status   Specimen Description BLOOD BLOOD RIGHT WRIST  Final   Special Requests   Final    BOTTLES DRAWN AEROBIC AND ANAEROBIC Blood Culture adequate volume   Culture   Final    NO GROWTH < 12 HOURS Performed at [facility], [address] [place], [place] [address]    Report Status PENDING  Incomplete  Blood culture (routine x 2)     Status: None (Preliminary result)   Collection Time: 02/23/19  7:28 PM   Specimen: BLOOD  Result Value Ref Range Status   Specimen Description BLOOD RIGHT ANTECUBITAL  Final   Special Requests   Final    BOTTLES DRAWN AEROBIC AND ANAEROBIC Blood Culture adequate volume   Culture   Final    NO GROWTH < 12 HOURS Performed at [facility] [place] [facility], [address], [place], [place] [address]    Report Status PENDING  Incomplete    Radiology Reports Dg Chest 1 View  Result Date: 02/23/2019 CLINICAL DATA:  Concern for COVID-19 EXAM: CHEST  1 VIEW COMPARISON:  04/26/2015 FINDINGS: The heart size and mediastinal contours are within normal limits. Both lungs are clear. The visualized skeletal structures are unremarkable. IMPRESSION: No acute abnormality of the lungs in AP portable projection. Electronically Signed   By: Eddie Candle M.D.   On: 02/23/2019 18:17   Ct Abdomen Pelvis W Contrast  Result Date: 02/23/2019 CLINICAL DATA:  Generalized abdominal pain, fever, chills, body aches EXAM: CT ABDOMEN AND PELVIS WITH CONTRAST TECHNIQUE: Multidetector CT imaging of the abdomen and pelvis was performed using the standard protocol following bolus administration of intravenous contrast. CONTRAST:  112mL OMNIPAQUE IOHEXOL 300 MG/ML  SOLN COMPARISON:  05/10/2013.  Ultrasound 09/06/2015. FINDINGS: Lower chest: calcified visualized right coronary artery. Heart is normal size.  Lungs clear. No effusions. Hepatobiliary: Small gallstones within the gallbladder. No focal hepatic abnormality or biliary ductal dilatation. Pancreas: No focal abnormality or ductal dilatation. Spleen: No focal abnormality.  Normal size. Adrenals/Urinary Tract: No adrenal abnormality. No focal renal abnormality. No stones or hydronephrosis. Urinary bladder is unremarkable. 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 Diffuse severe colonic diverticulosis. No active diverticulitis. Small bowel decompressed, unremarkable. Appendix is normal. Vascular/Lymphatic: Diffuse aortic and iliac atherosclerosis. Focal bulge in the infrarenal aorta measures 3 cm. No adenopathy. Reproductive: Prior hysterectomy.  No adnexal masses. Other: No free fluid or free air. Musculoskeletal: No acute bony abnormality. IMPRESSION: No acute findings in the abdomen or pelvis. Diffuse colonic diverticulosis.  No active diverticulitis. Cholelithiasis.  No CT evidence for cholecystitis. Aortic atherosclerosis.  Coronary artery disease. 3 cm infrarenal abdominal aortic aneurysm.Recommend followup by ultrasound in 3 years. This recommendation follows ACR consensus guidelines: White Paper of the ACR Incidental Findings Committee II on Vascular Findings. J Am Coll Radiol 2013; 10:789-794. Aortic aneurysm NOS (ICD10-I71.9) Electronically Signed   By: Rolm Baptise M.D.   On: 02/23/2019 19:48     CBC Recent Labs  Lab 02/23/19 1750  WBC 14.0*  HGB 14.4  HCT 44.8  PLT 136*  MCV 84.2  MCH 27.1  MCHC 32.1  RDW 13.7  LYMPHSABS 0.7  MONOABS 1.1*  EOSABS 0.0  BASOSABS 0.0    Chemistries  Recent Labs  Lab 02/23/19 1750  NA 135  K 3.5  CL 101  CO2 21*  GLUCOSE 188*  BUN 17  CREATININE 0.85  CALCIUM 9.1  AST 30  ALT 33  ALKPHOS 57  BILITOT 0.5   ------------------------------------------------------------------------------------------------------------------ estimated creatinine  clearance is 64.1 mL/min (by C-G formula based on SCr of 0.85 mg/dL). ------------------------------------------------------------------------------------------------------------------ No results for input(s): HGBA1C in the last 72 hours. ------------------------------------------------------------------------------------------------------------------ No results for input(s): CHOL, HDL, LDLCALC, TRIG, CHOLHDL, LDLDIRECT in the last 72 hours. ------------------------------------------------------------------------------------------------------------------ Recent Labs    02/24/19 0131  TSH 0.508   ------------------------------------------------------------------------------------------------------------------ No results for input(s): VITAMINB12, FOLATE, FERRITIN, TIBC, IRON, RETICCTPCT in the last 72 hours.  Coagulation profile No results for input(s): INR, PROTIME in the last 168 hours.  No results for input(s): DDIMER in the last 72 hours.  Cardiac Enzymes No results for input(s): CKMB, TROPONINI, MYOGLOBIN in the last 168 hours.  Invalid input(s): CK ------------------------------------------------------------------------------------------------------------------ Invalid input(s): [place]   This is a 73 year old female admitted for sepsis. 1.  Sepsis due to cellulitis: Patient meets criteria via fever, tachycardia, leukocytosis and lactic acidosis.  Left leg swollen and erythematous, continue IV Zosyn,  2.  Coronary artery disease: Stable; pain.  Continue aspirin. 3.  Hypertension: Uncontrolled; continue losartan, metoprolol and amlodipine.  Labetalol as needed. 4.  Diabetes mellitus type 2: Hold oral hypoglycemic agents.  Sliding scale insulin while hospitalized. 5.  Hyperlipidemia: Continue statin therapy 6.  DVT prophylaxis: Lovenox     Code Status Orders  (From admission, onward)         Start     Ordered   02/24/19 0030  Full code  Continuous      02/24/19 0029        Code Status History    Date Active Date Inactive Code Status Order ID Comments User Context   10/09/2014 1824 10/10/2014 1444 Full Code [id number]  Leonie Man,  MD Inpatient   10/09/2014 0025 10/09/2014 1824 Full Code [id number]  Deneise Lever, MD ED   01/05/2014 1206 01/06/2014 0336 Full Code [id number]  Jacqulynn Cadet, MD [facility]   Advance Care Planning Activity           Consults   DVT Prophylaxis  Lovenox  Lab Results  Component Value Date   PLT 136 (L) 02/23/2019     Time Spent in minutes 21min 1145: 12:30  Greater than 50% of time spent in care coordination and counseling patient regarding the condition and plan of care.   Dustin Flock M.D on 02/24/2019 at 12:44 PM  Between 7am to 6pm - Pager - [phone]  After 6pm go to [email or web address] - Proofreader  [employer]   Office  [phone]

## 2019-02-24 NOTE — Progress Notes (Signed)
Advanced care plan.  Purpose of the Encounter: CODE STATUS  Parties in Attendance: Patient herself  Patient's Decision Capacity: Intact  Subjective/Patient's story: Patient is 73 year old with history of diabetes type 2, hypertension who is presenting with fever and swelling of the leg noted to have cellulitis   Objective/Medical story  I discussed with the patient regarding her desires for cardiac and pulmonary resuscitation.  Goals of care determination:   Patient states she would like everything to be done  CODE STATUS:  Full code  Time spent discussing advanced care planning: 16 minutes

## 2019-02-25 LAB — CBC
HCT: 39.4 % (ref 36.0–46.0)
Hemoglobin: 12.6 g/dL (ref 12.0–15.0)
MCH: 26.9 pg (ref 26.0–34.0)
MCHC: 32 g/dL (ref 30.0–36.0)
MCV: 84.2 fL (ref 80.0–100.0)
Platelets: 110 10*3/uL — ABNORMAL LOW (ref 150–400)
RBC: 4.68 MIL/uL (ref 3.87–5.11)
RDW: 14.3 % (ref 11.5–15.5)
WBC: 10.1 10*3/uL (ref 4.0–10.5)
nRBC: 0 % (ref 0.0–0.2)

## 2019-02-25 LAB — HEMOGLOBIN A1C
Hgb A1c MFr Bld: 8.3 % — ABNORMAL HIGH (ref 4.8–5.6)
Mean Plasma Glucose: 192 mg/dL

## 2019-02-25 LAB — GLUCOSE, CAPILLARY
Glucose-Capillary: 105 mg/dL — ABNORMAL HIGH (ref 70–99)
Glucose-Capillary: 143 mg/dL — ABNORMAL HIGH (ref 70–99)
Glucose-Capillary: 125 mg/dL — ABNORMAL HIGH (ref 70–99)
Glucose-Capillary: 129 mg/dL — ABNORMAL HIGH (ref 70–99)

## 2019-02-25 LAB — BASIC METABOLIC PANEL
Anion gap: 11 (ref 5–15)
BUN: 10 mg/dL (ref 8–23)
CO2: 17 mmol/L — ABNORMAL LOW (ref 22–32)
Calcium: 7.9 mg/dL — ABNORMAL LOW (ref 8.9–10.3)
Chloride: 106 mmol/L (ref 98–111)
Creatinine, Ser: 0.61 mg/dL (ref 0.44–1.00)
GFR calc Af Amer: >60 mL/min (ref >60)
GFR calc non Af Amer: >60 mL/min (ref >60)
Glucose, Bld: 105 mg/dL — ABNORMAL HIGH (ref 70–99)
Potassium: 3.5 mmol/L (ref 3.5–5.1)
Sodium: 134 mmol/L — ABNORMAL LOW (ref 135–145)

## 2019-02-25 LAB — LACTIC ACID, PLASMA: Lactic Acid, Venous: 0.8 mmol/L (ref 0.5–1.9)

## 2019-02-25 NOTE — Progress Notes (Addendum)
[place] at [facility]                                                                                                                                                                                  Patient Demographics   Hayley Hogan, is a 73 y.o. female, DOB - Nov 10, 1945, [id number]  Admit date - 02/23/2019   Admitting Physician Harrie Foreman, MD  Outpatient Primary MD for the patient is Leanna Battles, MD   LOS - 2  Subjective: Patient states she is feeling a little bit better today.  She is no longer having fevers, chills, and diffuse muscle aches.  She feels like her left lower extremity warmth and erythema is about the same as yesterday.  No other concerns this morning.  Review of Systems:   CONSTITUTIONAL: No documented fever.  Positive fatigue, positive weakness. No weight gain, no weight loss.  EYES: No blurry or double vision.  ENT: No tinnitus. No postnasal drip. No redness of the oropharynx.  RESPIRATORY: No cough, no wheeze, no hemoptysis. No dyspnea.  CARDIOVASCULAR: No chest pain. No orthopnea. No palpitations. No syncope.  GASTROINTESTINAL: No nausea, no vomiting or diarrhea. No abdominal pain. No melena or hematochezia.  GENITOURINARY: No dysuria or hematuria.  ENDOCRINE: No polyuria or nocturia. No heat or cold intolerance.  HEMATOLOGY: No anemia. No bruising. No bleeding.  INTEGUMENTARY: No rashes. No lesions.  MUSCULOSKELETAL: No arthritis. No swelling. No gout. + Left leg pain. NEUROLOGIC: No numbness, tingling, or ataxia. No seizure-type activity.  PSYCHIATRIC: No anxiety. No insomnia. No ADD.    Vitals:   Vitals:   02/24/19 1838 02/24/19 1928 02/25/19 0352 02/25/19 0846  BP:  112/61 128/68 125/67  Pulse:  (!) 101 (!) 102 90  Resp:  20 20   Temp: 100.2 F (37.9 C) 99.2 F (37.3 C) 99.7 F (37.6 C) 98.5 F (36.9 C)  TempSrc: Oral Oral Oral Oral  SpO2:  94% 94% 95%  Weight:   80.4 kg   Height:        Wt  Readings from Last 3 Encounters:  02/25/19 80.4 kg  02/03/18 80.7 kg  12/01/17 81.6 kg     Intake/Output Summary (Last 24 hours) at 02/25/2019 1114 Last data filed at 02/25/2019 1033 Gross per 24 hour  Intake 720 ml  Output 2200 ml  Net -1480 ml    Physical Exam:   GENERAL: Pleasant-appearing in no apparent distress.  HEAD, EYES, EARS, NOSE AND THROAT: Atraumatic, normocephalic. Extraocular muscles are intact. Pupils equal and reactive to light. Sclerae anicteric. No conjunctival injection. No oro-pharyngeal erythema.  NECK: Supple. There is no jugular venous distention. No bruits, no  lymphadenopathy, no thyromegaly.  HEART: RRR, No murmurs, no rubs, no clicks.  LUNGS: Clear to auscultation bilaterally. No rales or rhonchi. No wheezes.  ABDOMEN: Soft, flat, nontender, nondistended. Has good bowel sounds. No hepatosplenomegaly appreciated.  EXTREMITIES: Left leg is erythematous, edematous, and warm to the touch.  See picture below. NEUROLOGIC: The patient is alert, awake, and oriented x3 with no focal motor or sensory deficits appreciated bilaterally.  SKIN: Moist and warm with no rashes appreciated.  Psych: Not anxious, depressed      Antibiotics   Anti-infectives (From admission, onward)   Start     Dose/Rate Route Frequency Ordered Stop   02/24/19 2300  vancomycin (VANCOCIN) IVPB 1000 mg/200 mL premix     1,000 mg 200 mL/hr over 60 Minutes Intravenous Every 24 hours 02/23/19 2354     02/23/19 2215  vancomycin (VANCOCIN) 1,750 mg in sodium chloride 0.9 % 500 mL IVPB     1,750 mg 250 mL/hr over 120 Minutes Intravenous  Once 02/23/19 2214 02/24/19 0239   02/23/19 2215  piperacillin-tazobactam (ZOSYN) IVPB 3.375 g     3.375 g 12.5 mL/hr over 240 Minutes Intravenous Every 8 hours 02/23/19 2214     02/23/19 2045  cefTRIAXone (ROCEPHIN) 1 g in sodium chloride 0.9 % 100 mL IVPB     1 g 200 mL/hr over 30 Minutes Intravenous  Once 02/23/19 2031 02/23/19 2221      Medications    Scheduled Meds: . amLODipine  2.5 mg Oral Daily  . aspirin EC  81 mg Oral Daily  . atorvastatin  80 mg Oral Daily  . docusate sodium  100 mg Oral BID  . enoxaparin (LOVENOX) injection  40 mg Subcutaneous Q24H  . insulin aspart  0-5 Units Subcutaneous QHS  . insulin aspart  0-9 Units Subcutaneous TID WC  . losartan  100 mg Oral Daily  . metoprolol tartrate  100 mg Oral BID  . multivitamin with minerals  1 tablet Oral Daily  . pantoprazole  40 mg Oral Daily  . [START ON 03/01/2019] Vitamin D (Ergocalciferol)  50,000 Units Oral Q7 days   Continuous Infusions: . sodium chloride 75 mL/hr at 02/24/19 2219  . piperacillin-tazobactam (ZOSYN)  IV 3.375 g (02/25/19 0531)  . vancomycin 1,000 mg (02/24/19 2207)   PRN Meds:.acetaminophen **OR** acetaminophen, nitroGLYCERIN, ondansetron **OR** ondansetron (ZOFRAN) IV, traMADol, zolpidem   Data Review:   Micro Results Recent Results (from the past 240 hour(s))  SARS Coronavirus 2 [facility] order, Performed in [facility] hospital lab) Nasopharyngeal Nasopharyngeal Swab     Status: None   Collection Time: 02/23/19  5:50 PM   Specimen: Nasopharyngeal Swab  Result Value Ref Range Status   SARS Coronavirus 2 NEGATIVE NEGATIVE Final    Comment: (NOTE) If result is NEGATIVE SARS-CoV-2 target nucleic acids are NOT DETECTED. The SARS-CoV-2 RNA is generally detectable in upper and lower  respiratory specimens during the acute phase of infection. The lowest  concentration of SARS-CoV-2 viral copies this assay can detect is 250  copies / mL. A negative result does not preclude SARS-CoV-2 infection  and should not be used as the sole basis for treatment or other  patient management decisions.  A negative result may occur with  improper specimen collection / handling, submission of specimen other  than nasopharyngeal swab, presence of viral mutation(s) within the  areas targeted by this assay, and inadequate number of viral copies  (<250 copies /  mL). A negative result must be combined with clinical  observations, patient history, and epidemiological information. If result is POSITIVE SARS-CoV-2 target nucleic acids are DETECTED. The SARS-CoV-2 RNA is generally detectable in upper and lower  respiratory specimens dur ing the acute phase of infection.  Positive  results are indicative of active infection with SARS-CoV-2.  Clinical  correlation with patient history and other diagnostic information is  necessary to determine patient infection status.  Positive results do  not rule out bacterial infection or co-infection with other viruses. If result is PRESUMPTIVE POSTIVE SARS-CoV-2 nucleic acids MAY BE PRESENT.   A presumptive positive result was obtained on the submitted specimen  and confirmed on repeat testing.  While 2019 novel coronavirus  (SARS-CoV-2) nucleic acids may be present in the submitted sample  additional confirmatory testing may be necessary for epidemiological  and / or clinical management purposes  to differentiate between  SARS-CoV-2 and other Sarbecovirus currently known to infect humans.  If clinically indicated additional testing with an alternate test  methodology [phone]) is advised. The SARS-CoV-2 RNA is generally  detectable in upper and lower respiratory sp ecimens during the acute  phase of infection. The expected result is Negative. Fact Sheet for Patients:  StrictlyIdeas.no Fact Sheet for Healthcare Providers: BankingDealers.co.za This test is not yet approved or cleared by the [place] FDA and has been authorized for detection and/or diagnosis of SARS-CoV-2 by FDA under an Emergency Use Authorization (EUA).  This EUA will remain in effect (meaning this test can be used) for the duration of the COVID-19 declaration under Section 564(b)(1) of the Act, 21 U.S.C. section 360bbb-3(b)(1), unless the authorization is terminated or revoked sooner.  Performed at [facility], [address] [place], [place] [address]   Blood culture (routine x 2)     Status: None (Preliminary result)   Collection Time: 02/23/19  7:28 PM   Specimen: BLOOD  Result Value Ref Range Status   Specimen Description BLOOD BLOOD RIGHT WRIST  Final   Special Requests   Final    BOTTLES DRAWN AEROBIC AND ANAEROBIC Blood Culture adequate volume   Culture   Final    NO GROWTH 2 DAYS Performed at [facility], [address], [place], [place] [address]    Report Status PENDING  Incomplete  Blood culture (routine x 2)     Status: None (Preliminary result)   Collection Time: 02/23/19  7:28 PM   Specimen: BLOOD  Result Value Ref Range Status   Specimen Description BLOOD RIGHT ANTECUBITAL  Final   Special Requests   Final    BOTTLES DRAWN AEROBIC AND ANAEROBIC Blood Culture adequate volume   Culture   Final    NO GROWTH 2 DAYS Performed at [facility], [address], [place],  [address]    Report Status PENDING  Incomplete    Radiology Reports Dg Chest 1 View  Result Date: 02/23/2019 CLINICAL DATA:  Concern for COVID-19 EXAM: CHEST  1 VIEW COMPARISON:  04/26/2015 FINDINGS: The heart size and mediastinal contours are within normal limits. Both lungs are clear. The visualized skeletal structures are unremarkable. IMPRESSION: No acute abnormality of the lungs in AP portable projection. Electronically Signed   By: Eddie Candle M.D.   On: 02/23/2019 18:17   Ct Abdomen Pelvis W Contrast  Result Date: 02/23/2019 CLINICAL DATA:  Generalized abdominal pain, fever, chills, body aches EXAM: CT ABDOMEN AND PELVIS WITH CONTRAST TECHNIQUE: Multidetector CT imaging of the abdomen and pelvis was performed using the standard protocol following bolus administration of intravenous  contrast. CONTRAST:  149mL OMNIPAQUE IOHEXOL 300 MG/ML  SOLN COMPARISON:  05/10/2013.  Ultrasound 09/06/2015. FINDINGS: Lower chest: calcified visualized right  coronary artery. Heart is normal size. Lungs clear. No effusions. Hepatobiliary: Small gallstones within the gallbladder. No focal hepatic abnormality or biliary ductal dilatation. Pancreas: No focal abnormality or ductal dilatation. Spleen: No focal abnormality.  Normal size. Adrenals/Urinary Tract: No adrenal abnormality. No focal renal abnormality. No stones or hydronephrosis. Urinary bladder is unremarkable. 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 Diffuse severe colonic diverticulosis. No active diverticulitis. Small bowel decompressed, unremarkable. Appendix is normal. Vascular/Lymphatic: Diffuse aortic and iliac atherosclerosis. Focal bulge in the infrarenal aorta measures 3 cm. No adenopathy. Reproductive: Prior hysterectomy.  No adnexal masses. Other: No free fluid or free air. Musculoskeletal: No acute bony abnormality. IMPRESSION: No acute findings in the abdomen or pelvis. Diffuse colonic diverticulosis.  No active diverticulitis. Cholelithiasis.  No CT evidence for cholecystitis. Aortic atherosclerosis.  Coronary artery disease. 3 cm infrarenal abdominal aortic aneurysm.Recommend followup by ultrasound in 3 years. This recommendation follows ACR consensus guidelines: White Paper of the ACR Incidental Findings Committee II on Vascular Findings. J Am Coll Radiol 2013; 10:789-794. Aortic aneurysm NOS (ICD10-I71.9) Electronically Signed   By: Rolm Baptise M.D.   On: 02/23/2019 19:48     CBC Recent Labs  Lab 02/23/19 1750 02/25/19 0450  WBC 14.0* 10.1  HGB 14.4 12.6  HCT 44.8 39.4  PLT 136* 110*  MCV 84.2 84.2  MCH 27.1 26.9  MCHC 32.1 32.0  RDW 13.7 14.3  LYMPHSABS 0.7  --   MONOABS 1.1*  --   EOSABS 0.0  --   BASOSABS 0.0  --     Chemistries  Recent Labs  Lab 02/23/19 1750 02/25/19 0450  NA 135 134*  K 3.5 3.5  CL 101 106  CO2 21* 17*  GLUCOSE 188* 105*  BUN 17 10  CREATININE 0.85 0.61  CALCIUM 9.1 7.9*  AST 30  --   ALT 33   --   ALKPHOS 57  --   BILITOT 0.5  --    ------------------------------------------------------------------------------------------------------------------ estimated creatinine clearance is 68.5 mL/min (by C-G formula based on SCr of 0.61 mg/dL). ------------------------------------------------------------------------------------------------------------------ Recent Labs    02/24/19 0131  HGBA1C 8.3*   ------------------------------------------------------------------------------------------------------------------ No results for input(s): CHOL, HDL, LDLCALC, TRIG, CHOLHDL, LDLDIRECT in the last 72 hours. ------------------------------------------------------------------------------------------------------------------ Recent Labs    02/24/19 0131  TSH 0.508   ------------------------------------------------------------------------------------------------------------------ No results for input(s): VITAMINB12, FOLATE, FERRITIN, TIBC, IRON, RETICCTPCT in the last 72 hours.  Coagulation profile No results for input(s): INR, PROTIME in the last 168 hours.  No results for input(s): DDIMER in the last 72 hours.  Cardiac Enzymes No results for input(s): CKMB, TROPONINI, MYOGLOBIN in the last 168 hours.  Invalid input(s): CK ------------------------------------------------------------------------------------------------------------------ Invalid input(s): POCBNP    Assessment & Plan   Sepsis secondary to left lower extremity cellulitis- meeting sepsis criteria on admission with fever, tachycardia, leukocytosis and lactic acidosis.  Sepsis has resolved.  T-max 100.79F over the last 24 hours.  -Continue vancomycin and zosyn, will likely transition to p.o. antibiotics tomorrow -Blood cultures with no growth x 2 days -Continue IV fluids for today  Coronary artery disease-stable, no active chest pain. -Continue home aspirin  Hypertension-BPs have improved -Continue losartan,  metoprolol and amlodipine -Labetalol IV as needed  Type 2 diabetes  -Continue SSI  Hyperlipidemia -Continue statin  Chronic thrombocytopenia- platelets are at baseline -Follow-up as an outpatient  DVT prophylaxis-Lovenox  Code Status Orders  (From admission, onward)         Start     Ordered   02/24/19 0030  Full code  Continuous     02/24/19 0029        Code Status History    Date Active Date Inactive Code Status Order ID Comments User Context   10/09/2014 1824 10/10/2014 1444 Full Code [id number]  Leonie Man, MD Inpatient   10/09/2014 0025 10/09/2014 1824 Full Code [id number]  Deneise Lever, MD ED   01/05/2014 1206 01/06/2014 0336 Full Code [id number]  Jacqulynn Cadet, MD [facility]   Advance Care Planning Activity     Consults: None  DVT Prophylaxis:  Lovenox  Lab Results  Component Value Date   PLT 110 (L) 02/25/2019     Time Spent in minutes 38min   Greater than 50% of time spent in care coordination and counseling patient regarding the condition and plan of care.   Berna Spare Mayo M.D on 02/25/2019 at 11:14 AM  Between 7am to 6pm - Pager - [phone]  After 6pm go to [email or web address] - Proofreader  [employer]   Office  [phone]

## 2019-02-25 NOTE — Progress Notes (Signed)
Physical Therapy Evaluation Patient Details Name: Hayley Hogan MRN: [id number] DOB: 01/22/1946 Today's Date: 02/25/2019   History of Present Illness  The patient with past medical history of diabetes, coronary artery disease and hypertension presents to the emergency department with fever.  There is 102.8 F.  She denies nausea, vomiting or diarrhea.  She denies abdominal pain or shortness of breath.   Clinical Impression  Patient performs bed mobility with min assist supine<>sit to edge of bed, but pain is too high and patient is not able to tolerate sitting position. She is not able to attempt transfers or gait at this time. She has WFL strength BLE and sitting balance is good.  She will benefit from skilled PT to improve mobility.    Follow Up Recommendations Home health PT    Equipment Recommendations  Rolling walker with 5" wheels    Recommendations for Other Services       Precautions / Restrictions        Mobility  Bed Mobility Overal bed mobility: Modified Independent                Transfers Overall transfer level: (unable due to pain LLE foot)                  Ambulation/Gait Ambulation/Gait assistance: (unable)              Stairs            Wheelchair Mobility    Modified Rankin (Stroke Patients Only)       Balance Overall balance assessment: No apparent balance deficits (not formally assessed)                                           Pertinent Vitals/Pain Pain Assessment: 0-10 Pain Score: 5  Pain Location: left ankle/foot Pain Descriptors / Indicators: Aching Pain Intervention(s): Limited activity within patient's tolerance    Home Living Family/patient expects to be discharged to:: Private residence Living Arrangements: Spouse/significant other Available Help at Discharge: Family Type of Home: House                Prior Function Level of Independence: Independent               Hand  Dominance        Extremity/Trunk Assessment   Upper Extremity Assessment Upper Extremity Assessment: Overall WFL for tasks assessed    Lower Extremity Assessment Lower Extremity Assessment: Overall WFL for tasks assessed       Communication   Communication: No difficulties  Cognition Arousal/Alertness: Awake/alert Behavior During Therapy: WFL for tasks assessed/performed Overall Cognitive Status: Within Functional Limits for tasks assessed                                        General Comments General comments (skin integrity, edema, etc.): (left ankle/foot swollen and red)    Exercises     Assessment/Plan    PT Assessment Patient needs continued PT services  PT Problem List Pain       PT Treatment Interventions Gait training;Therapeutic exercise    PT Goals (Current goals can be found in the Care Plan section)  Acute Rehab PT Goals Patient Stated Goal: to go home PT Goal Formulation: With patient Time For Goal Achievement: 03/11/19 Potential  to Achieve Goals: Good    Frequency Min 2X/week   Barriers to discharge        Co-evaluation               AM-PAC PT "6 Clicks" Mobility  Outcome Measure Help needed turning from your back to your side while in a flat bed without using bedrails?: A Little Help needed moving from lying on your back to sitting on the side of a flat bed without using bedrails?: A Little Help needed moving to and from a bed to a chair (including a wheelchair)?: None Help needed standing up from a chair using your arms (e.g., wheelchair or bedside chair)?: None Help needed to walk in hospital room?: None Help needed climbing 3-5 steps with a railing? : None 6 Click Score: 22    End of Session   Activity Tolerance: Patient limited by pain     PT Visit Diagnosis: Difficulty in walking, not elsewhere classified (R26.2)    Time: 1345-1400 PT Time Calculation (min) (ACUTE ONLY): 15 min   Charges:                 Alanson Puls, PT DPt 02/25/2019, 4:07 PM

## 2019-02-25 NOTE — Progress Notes (Signed)
Called husband, Delfino Lovett, and updated him over the phone. All questions answered.  Hyman Bible, MD

## 2019-02-26 LAB — CREATININE, SERUM
Creatinine, Ser: 0.63 mg/dL (ref 0.44–1.00)
GFR calc Af Amer: >60 mL/min (ref >60)
GFR calc non Af Amer: >60 mL/min (ref >60)

## 2019-02-26 LAB — GLUCOSE, CAPILLARY
Glucose-Capillary: 107 mg/dL — ABNORMAL HIGH (ref 70–99)
Glucose-Capillary: 98 mg/dL (ref 70–99)
Glucose-Capillary: 123 mg/dL — ABNORMAL HIGH (ref 70–99)
Glucose-Capillary: 149 mg/dL — ABNORMAL HIGH (ref 70–99)

## 2019-02-26 MED ORDER — SODIUM CHLORIDE 0.9 % IV SOLN
INTRAVENOUS | Status: DC | PRN
  Administered 2019-02-26: 1000 mL via INTRAVENOUS
  Administered 2019-02-27: 250 mL via INTRAVENOUS
  Administered 2019-02-27: 1000 mL via INTRAVENOUS

## 2019-02-26 MED ORDER — ENOXAPARIN SODIUM 40 MG/0.4ML ~~LOC~~ SOLN
40.0000 mg | SUBCUTANEOUS | Status: DC
  Administered 2019-02-26 – 2019-02-27 (×2): 40 mg via SUBCUTANEOUS
  Filled 2019-02-26 (×2): qty 0.4

## 2019-02-26 NOTE — Progress Notes (Signed)
[place] at [facility]                                                                                                                                                                                  Patient Demographics   Hayley Hogan, is a 73 y.o. female, DOB - 1946/01/31, [id number]  Admit date - 02/23/2019   Admitting Physician Harrie Foreman, MD  Outpatient Primary MD for the patient is Leanna Battles, MD   LOS - 3  Subjective: Doing better today.  She feels like her lower extremity swelling and redness has gotten better.  She did have some "burning" pain when she sat up in the chair.  No fevers or chills.  Review of Systems:   CONSTITUTIONAL: No documented fever.  Positive fatigue, positive weakness. No weight gain, no weight loss.  EYES: No blurry or double vision.  ENT: No tinnitus. No postnasal drip. No redness of the oropharynx.  RESPIRATORY: No cough, no wheeze, no hemoptysis. No dyspnea.  CARDIOVASCULAR: No chest pain. No orthopnea. No palpitations. No syncope.  GASTROINTESTINAL: No nausea, no vomiting or diarrhea. No abdominal pain. No melena or hematochezia.  GENITOURINARY: No dysuria or hematuria.  ENDOCRINE: No polyuria or nocturia. No heat or cold intolerance.  HEMATOLOGY: No anemia. No bruising. No bleeding.  INTEGUMENTARY: No rashes. No lesions.  MUSCULOSKELETAL: No arthritis. No swelling. No gout. + Left leg pain. NEUROLOGIC: No numbness, tingling, or ataxia. No seizure-type activity.  PSYCHIATRIC: No anxiety. No insomnia. No ADD.    Vitals:   Vitals:   02/25/19 1554 02/25/19 1932 02/26/19 0420 02/26/19 0802  BP: 126/72 129/75 133/77 (!) 141/77  Pulse: 75 84 81 82  Resp: 18 20 20 18   Temp: 99.3 F (37.4 C) 98.2 F (36.8 C) 99.3 F (37.4 C) 99.8 F (37.7 C)  TempSrc: Oral Oral Oral Oral  SpO2: 95% 96% 95% 96%  Weight:   79.5 kg   Height:        Wt Readings from Last 3 Encounters:  02/26/19 79.5 kg  02/03/18  80.7 kg  12/01/17 81.6 kg     Intake/Output Summary (Last 24 hours) at 02/26/2019 1247 Last data filed at 02/26/2019 1001 Gross per 24 hour  Intake 4288.66 ml  Output 5300 ml  Net -1011.34 ml    Physical Exam:   GENERAL: Pleasant-appearing in no apparent distress.  HEENT: Atraumatic, normocephalic. Extraocular muscles are intact. Pupils equal and reactive to light. Sclerae anicteric. No conjunctival injection. No oro-pharyngeal erythema.  NECK: Supple. There is no jugular venous distention. No bruits, no lymphadenopathy, no thyromegaly.  HEART: RRR, No murmurs, no rubs, no clicks.  LUNGS: Clear to  auscultation bilaterally. No rales or rhonchi. No wheezes.  ABDOMEN: Soft, flat, nontender, nondistended. Has good bowel sounds. No hepatosplenomegaly appreciated.  EXTREMITIES: Left leg is erythematous, edematous, and warm to the touch.  See picture below. NEUROLOGIC: The patient is alert, awake, and oriented x 3 with no focal motor or sensory deficits appreciated bilaterally.  SKIN: Moist and warm with no rashes appreciated.  Psych: Not anxious, depressed      Antibiotics   Anti-infectives (From admission, onward)   Start     Dose/Rate Route Frequency Ordered Stop   02/24/19 2300  vancomycin (VANCOCIN) IVPB 1000 mg/200 mL premix     1,000 mg 200 mL/hr over 60 Minutes Intravenous Every 24 hours 02/23/19 2354     02/23/19 2215  vancomycin (VANCOCIN) 1,750 mg in sodium chloride 0.9 % 500 mL IVPB     1,750 mg 250 mL/hr over 120 Minutes Intravenous  Once 02/23/19 2214 02/24/19 0239   02/23/19 2215  piperacillin-tazobactam (ZOSYN) IVPB 3.375 g     3.375 g 12.5 mL/hr over 240 Minutes Intravenous Every 8 hours 02/23/19 2214     02/23/19 2045  cefTRIAXone (ROCEPHIN) 1 g in sodium chloride 0.9 % 100 mL IVPB     1 g 200 mL/hr over 30 Minutes Intravenous  Once 02/23/19 2031 02/23/19 2221      Medications   Scheduled Meds: . amLODipine  2.5 mg Oral Daily  . aspirin EC  81 mg Oral  Daily  . atorvastatin  80 mg Oral Daily  . docusate sodium  100 mg Oral BID  . enoxaparin (LOVENOX) injection  40 mg Subcutaneous Q24H  . insulin aspart  0-5 Units Subcutaneous QHS  . insulin aspart  0-9 Units Subcutaneous TID WC  . losartan  100 mg Oral Daily  . metoprolol tartrate  100 mg Oral BID  . multivitamin with minerals  1 tablet Oral Daily  . pantoprazole  40 mg Oral Daily  . [START ON 03/01/2019] Vitamin D (Ergocalciferol)  50,000 Units Oral Q7 days   Continuous Infusions: . sodium chloride 75 mL/hr at 02/26/19 0952  . piperacillin-tazobactam (ZOSYN)  IV Stopped (02/26/19 0950)  . vancomycin Stopped (02/25/19 2308)   PRN Meds:.acetaminophen **OR** acetaminophen, nitroGLYCERIN, ondansetron **OR** ondansetron (ZOFRAN) IV, traMADol, zolpidem   Data Review:   Micro Results Recent Results (from the past 240 hour(s))  SARS Coronavirus 2 [facility] order, Performed in [facility] hospital lab) Nasopharyngeal Nasopharyngeal Swab     Status: None   Collection Time: 02/23/19  5:50 PM   Specimen: Nasopharyngeal Swab  Result Value Ref Range Status   SARS Coronavirus 2 NEGATIVE NEGATIVE Final    Comment: (NOTE) If result is NEGATIVE SARS-CoV-2 target nucleic acids are NOT DETECTED. The SARS-CoV-2 RNA is generally detectable in upper and lower  respiratory specimens during the acute phase of infection. The lowest  concentration of SARS-CoV-2 viral copies this assay can detect is 250  copies / mL. A negative result does not preclude SARS-CoV-2 infection  and should not be used as the sole basis for treatment or other  patient management decisions.  A negative result may occur with  improper specimen collection / handling, submission of specimen other  than nasopharyngeal swab, presence of viral mutation(s) within the  areas targeted by this assay, and inadequate number of viral copies  (<250 copies / mL). A negative result must be combined with clinical  observations, patient  history, and epidemiological information. If result is POSITIVE SARS-CoV-2 target nucleic acids are DETECTED. The  SARS-CoV-2 RNA is generally detectable in upper and lower  respiratory specimens dur ing the acute phase of infection.  Positive  results are indicative of active infection with SARS-CoV-2.  Clinical  correlation with patient history and other diagnostic information is  necessary to determine patient infection status.  Positive results do  not rule out bacterial infection or co-infection with other viruses. If result is PRESUMPTIVE POSTIVE SARS-CoV-2 nucleic acids MAY BE PRESENT.   A presumptive positive result was obtained on the submitted specimen  and confirmed on repeat testing.  While 2019 novel coronavirus  (SARS-CoV-2) nucleic acids may be present in the submitted sample  additional confirmatory testing may be necessary for epidemiological  and / or clinical management purposes  to differentiate between  SARS-CoV-2 and other Sarbecovirus currently known to infect humans.  If clinically indicated additional testing with an alternate test  methodology [phone]) is advised. The SARS-CoV-2 RNA is generally  detectable in upper and lower respiratory sp ecimens during the acute  phase of infection. The expected result is Negative. Fact Sheet for Patients:  StrictlyIdeas.no Fact Sheet for Healthcare Providers: BankingDealers.co.za This test is not yet approved or cleared by the [place] FDA and has been authorized for detection and/or diagnosis of SARS-CoV-2 by FDA under an Emergency Use Authorization (EUA).  This EUA will remain in effect (meaning this test can be used) for the duration of the COVID-19 declaration under Section 564(b)(1) of the Act, 21 U.S.C. section 360bbb-3(b)(1), unless the authorization is terminated or revoked sooner. Performed at [facility], [address] [place], [place] [address]    Blood culture (routine x 2)     Status: None (Preliminary result)   Collection Time: 02/23/19  7:28 PM   Specimen: BLOOD  Result Value Ref Range Status   Specimen Description BLOOD BLOOD RIGHT WRIST  Final   Special Requests   Final    BOTTLES DRAWN AEROBIC AND ANAEROBIC Blood Culture adequate volume   Culture   Final    NO GROWTH 3 DAYS Performed at [facility], [address] [place], [place] [address]    Report Status PENDING  Incomplete  Blood culture (routine x 2)     Status: None (Preliminary result)   Collection Time: 02/23/19  7:28 PM   Specimen: BLOOD  Result Value Ref Range Status   Specimen Description BLOOD RIGHT ANTECUBITAL  Final   Special Requests   Final    BOTTLES DRAWN AEROBIC AND ANAEROBIC Blood Culture adequate volume   Culture   Final    NO GROWTH 3 DAYS Performed at [facility], [address], [place], [place] [address]    Report Status PENDING  Incomplete    Radiology Reports Dg Chest 1 View  Result Date: 02/23/2019 CLINICAL DATA:  Concern for COVID-19 EXAM: CHEST  1 VIEW COMPARISON:  04/26/2015 FINDINGS: The heart size and mediastinal contours are within normal limits. Both lungs are clear. The visualized skeletal structures are unremarkable. IMPRESSION: No acute abnormality of the lungs in AP portable projection. Electronically Signed   By: Eddie Candle M.D.   On: 02/23/2019 18:17   Ct Abdomen Pelvis W Contrast  Result Date: 02/23/2019 CLINICAL DATA:  Generalized abdominal pain, fever, chills, body aches EXAM: CT ABDOMEN AND PELVIS WITH CONTRAST TECHNIQUE: Multidetector CT imaging of the abdomen and pelvis was performed using the standard protocol following bolus administration of intravenous contrast. CONTRAST:  128mL OMNIPAQUE IOHEXOL 300 MG/ML  SOLN COMPARISON:  05/10/2013.  Ultrasound 09/06/2015. FINDINGS:  Lower chest: calcified visualized right coronary artery. Heart is normal size. Lungs clear. No effusions. Hepatobiliary:  Small gallstones within the gallbladder. No focal hepatic abnormality or biliary ductal dilatation. Pancreas: No focal abnormality or ductal dilatation. Spleen: No focal abnormality.  Normal size. Adrenals/Urinary Tract: No adrenal abnormality. No focal renal abnormality. No stones or hydronephrosis. Urinary bladder is unremarkable. 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 Diffuse severe colonic diverticulosis. No active diverticulitis. Small bowel decompressed, unremarkable. Appendix is normal. Vascular/Lymphatic: Diffuse aortic and iliac atherosclerosis. Focal bulge in the infrarenal aorta measures 3 cm. No adenopathy. Reproductive: Prior hysterectomy.  No adnexal masses. Other: No free fluid or free air. Musculoskeletal: No acute bony abnormality. IMPRESSION: No acute findings in the abdomen or pelvis. Diffuse colonic diverticulosis.  No active diverticulitis. Cholelithiasis.  No CT evidence for cholecystitis. Aortic atherosclerosis.  Coronary artery disease. 3 cm infrarenal abdominal aortic aneurysm.Recommend followup by ultrasound in 3 years. This recommendation follows ACR consensus guidelines: White Paper of the ACR Incidental Findings Committee II on Vascular Findings. J Am Coll Radiol 2013; 10:789-794. Aortic aneurysm NOS (ICD10-I71.9) Electronically Signed   By: Rolm Baptise M.D.   On: 02/23/2019 19:48     CBC Recent Labs  Lab 02/23/19 1750 02/25/19 0450  WBC 14.0* 10.1  HGB 14.4 12.6  HCT 44.8 39.4  PLT 136* 110*  MCV 84.2 84.2  MCH 27.1 26.9  MCHC 32.1 32.0  RDW 13.7 14.3  LYMPHSABS 0.7  --   MONOABS 1.1*  --   EOSABS 0.0  --   BASOSABS 0.0  --     Chemistries  Recent Labs  Lab 02/23/19 1750 02/25/19 0450 02/26/19 0612  NA 135 134*  --   K 3.5 3.5  --   CL 101 106  --   CO2 21* 17*  --   GLUCOSE 188* 105*  --   BUN 17 10  --   CREATININE 0.85 0.61 0.63  CALCIUM 9.1 7.9*  --   AST 30  --   --   ALT 33  --   --   ALKPHOS  57  --   --   BILITOT 0.5  --   --    ------------------------------------------------------------------------------------------------------------------ estimated creatinine clearance is 68.1 mL/min (by C-G formula based on SCr of 0.63 mg/dL). ------------------------------------------------------------------------------------------------------------------ Recent Labs    02/24/19 0131  HGBA1C 8.3*   ------------------------------------------------------------------------------------------------------------------ No results for input(s): CHOL, HDL, LDLCALC, TRIG, CHOLHDL, LDLDIRECT in the last 72 hours. ------------------------------------------------------------------------------------------------------------------ Recent Labs    02/24/19 0131  TSH 0.508   ------------------------------------------------------------------------------------------------------------------ No results for input(s): VITAMINB12, FOLATE, FERRITIN, TIBC, IRON, RETICCTPCT in the last 72 hours.  Coagulation profile No results for input(s): INR, PROTIME in the last 168 hours.  No results for input(s): DDIMER in the last 72 hours.  Cardiac Enzymes No results for input(s): CKMB, TROPONINI, MYOGLOBIN in the last 168 hours.  Invalid input(s): CK ------------------------------------------------------------------------------------------------------------------ Invalid input(s): [place]   Left lower extremity cellulitis- initially meeting sepsis criteria on admission with fever, tachycardia, leukocytosis and lactic acidosis, but sepsis has resolved.   -Continue vancomycin and zosyn for another 24 hours -Blood cultures with no growth x 2 days -Stop IV fluids for today  Coronary artery disease-stable, no active chest pain. -Continue home aspirin  Hypertension-BPs have improved -Continue losartan, metoprolol and amlodipine -Labetalol IV as needed  Type 2 diabetes  -Continue SSI   Hyperlipidemia -Continue statin  Chronic thrombocytopenia- platelets are at baseline -Follow-up  as an outpatient  DVT prophylaxis-Lovenox     Code Status Orders  (From admission, onward)         Start     Ordered   02/24/19 0030  Full code  Continuous     02/24/19 0029        Code Status History    Date Active Date Inactive Code Status Order ID Comments User Context   10/09/2014 1824 10/10/2014 1444 Full Code [id number]  Leonie Man, MD Inpatient   10/09/2014 0025 10/09/2014 1824 Full Code [id number]  Deneise Lever, MD ED   01/05/2014 1206 01/06/2014 0336 Full Code [id number]  Jacqulynn Cadet, MD [facility]   Advance Care Planning Activity     Consults: None  DVT Prophylaxis:  Lovenox  Lab Results  Component Value Date   PLT 110 (L) 02/25/2019     Time Spent in minutes 43min   Greater than 50% of time spent in care coordination and counseling patient regarding the condition and plan of care.   Berna Spare Mayo M.D on 02/26/2019 at 12:47 PM  Between 7am to 6pm - Pager - [phone]  After 6pm go to [email or web address] - Proofreader  [employer]   Office  [phone]

## 2019-02-27 LAB — GLUCOSE, CAPILLARY
Glucose-Capillary: 160 mg/dL — ABNORMAL HIGH (ref 70–99)
Glucose-Capillary: 125 mg/dL — ABNORMAL HIGH (ref 70–99)
Glucose-Capillary: 139 mg/dL — ABNORMAL HIGH (ref 70–99)
Glucose-Capillary: 137 mg/dL — ABNORMAL HIGH (ref 70–99)

## 2019-02-27 LAB — CREATININE, SERUM
Creatinine, Ser: 0.76 mg/dL (ref 0.44–1.00)
GFR calc Af Amer: >60 mL/min (ref >60)
GFR calc non Af Amer: >60 mL/min (ref >60)

## 2019-02-27 MED ORDER — SODIUM CHLORIDE 0.9 % IV SOLN
1.0000 g | INTRAVENOUS | Status: DC
  Administered 2019-02-27: 22:00:00 1 g via INTRAVENOUS
  Filled 2019-02-27: qty 1
  Filled 2019-02-27: qty 10

## 2019-02-27 NOTE — Progress Notes (Signed)
Physical Therapy Treatment Patient Details Name: Hayley Hogan MRN: [id number] DOB: January 02, 1946 Today's Date: 02/27/2019    History of Present Illness The patient with past medical history of diabetes, coronary artery disease and hypertension presents to the emergency department with fever.  There is 102.8 F.  She denies nausea, vomiting or diarrhea.  She denies abdominal pain or shortness of breath.     PT Comments    Patient reported improved LLE pain symptoms compared to last couple of days, stated it was 1/10 at rest and 6-7/10 today with mobility. Pt agreeable to ambulate, but preferred to return to bed to elevate and rest LLE, stated she had been out of bed into the chair earlier today. Bed mobility performed mod I, pt demonstrated good sitting balance. Ambulated ~32ft with RW and CGA. Exhibited antalgic gait on LLE, decreased gait velocity/step length but overall steady. 1-2 standing rest breaks due to increased pain. The patient demonstrated improvement towards goals this session and would benefit from continued skilled PT intervention to return to PLOF as able.     Follow Up Recommendations  Home health PT     Equipment Recommendations  Rolling walker with 5" wheels    Recommendations for Other Services       Precautions / Restrictions Precautions Precautions: Fall Precaution Comments: LLE pain Restrictions Weight Bearing Restrictions: No    Mobility  Bed Mobility Overal bed mobility: Modified Independent                Transfers Overall transfer level: Needs assistance Equipment used: Rolling walker (2 wheeled) Transfers: Sit to/from Stand Sit to Stand: Min guard            Ambulation/Gait   Gait Distance (Feet): 45 Feet Assistive device: Rolling walker (2 wheeled) Gait Pattern/deviations: Antalgic     General Gait Details: Antalgic gait on LLE, slow cadence but overall steady   Stairs             Wheelchair Mobility    Modified  [place] (Stroke Patients Only)       Balance Overall balance assessment: No apparent balance deficits (not formally assessed)                                          Cognition Arousal/Alertness: Awake/alert Behavior During Therapy: WFL for tasks assessed/performed Overall Cognitive Status: Within Functional Limits for tasks assessed                                        Exercises      General Comments        Pertinent Vitals/Pain Pain Assessment: 0-10 Pain Score: 1  Pain Location: 1 at rest, 6-7/10 while ambulating Pain Descriptors / Indicators: Aching;Burning Pain Intervention(s): Limited activity within patient's tolerance;Monitored during session;Repositioned    Home Living                      Prior Function            PT Goals (current goals can now be found in the care plan section) Progress towards PT goals: Progressing toward goals    Frequency    Min 2X/week      PT Plan Current plan remains appropriate    Co-evaluation  AM-PAC PT "6 Clicks" Mobility   Outcome Measure  Help needed turning from your back to your side while in a flat bed without using bedrails?: A Little Help needed moving from lying on your back to sitting on the side of a flat bed without using bedrails?: A Little Help needed moving to and from a bed to a chair (including a wheelchair)?: A Little Help needed standing up from a chair using your arms (e.g., wheelchair or bedside chair)?: A Little Help needed to walk in hospital room?: A Little Help needed climbing 3-5 steps with a railing? : A Lot 6 Click Score: 17    End of Session Equipment Utilized During Treatment: Gait belt Activity Tolerance: Patient limited by pain Patient left: in bed;with bed alarm set Nurse Communication: Mobility status PT Visit Diagnosis: Difficulty in walking, not elsewhere classified (R26.2);Pain Pain - Right/Left: Left Pain - part of  body: Leg     Time: [id number] PT Time Calculation (min) (ACUTE ONLY): 21 min  Charges:  $Therapeutic Exercise: 8-22 mins                     Lieutenant Diego PT, DPT 3:39 PM,02/27/19 [phone]

## 2019-02-27 NOTE — Progress Notes (Addendum)
[place] at [facility]                                                                                                                                                                                  Patient Demographics   Hayley Hogan, is a 72 y.o. female, DOB - Sep 06, 1945, [id number]  Admit date - 02/23/2019   Admitting Physician Harrie Foreman, MD  Outpatient Primary MD for the patient is Leanna Battles, MD   LOS - 4  Subjective: Patient feels like the redness, swelling, and pain of her left leg are improving.  She states she does not feel ready to go home today, because she still feels pretty weak when she walks and has worsening "throbbing pain" with ambulation.  No fevers or chills.  Review of Systems:   CONSTITUTIONAL: No documented fever.  Positive fatigue, positive weakness. No weight gain, no weight loss.  EYES: No blurry or double vision.  ENT: No tinnitus. No postnasal drip. No redness of the oropharynx.  RESPIRATORY: No cough, no wheeze, no hemoptysis. No dyspnea.  CARDIOVASCULAR: No chest pain. No orthopnea. No palpitations. No syncope.  GASTROINTESTINAL: No nausea, no vomiting or diarrhea. No abdominal pain. No melena or hematochezia.  GENITOURINARY: No dysuria or hematuria.  ENDOCRINE: No polyuria or nocturia. No heat or cold intolerance.  HEMATOLOGY: No anemia. No bruising. No bleeding.  INTEGUMENTARY: No rashes. No lesions.  MUSCULOSKELETAL: No arthritis. No swelling. No gout. + Left leg pain. NEUROLOGIC: No numbness, tingling, or ataxia. No seizure-type activity.  PSYCHIATRIC: No anxiety. No insomnia. No ADD.    Vitals:   Vitals:   02/26/19 1941 02/27/19 0500 02/27/19 0548 02/27/19 0730  BP: 112/71  134/78 125/75  Pulse: 75  67 73  Resp: 16  16 19   Temp: 98.2 F (36.8 C)  98.2 F (36.8 C) 98.5 F (36.9 C)  TempSrc: Oral  Oral Oral  SpO2: 97%  95% 96%  Weight: 79.4 kg 76.5 kg    Height:        Wt Readings from Last  3 Encounters:  02/27/19 76.5 kg  02/03/18 80.7 kg  12/01/17 81.6 kg     Intake/Output Summary (Last 24 hours) at 02/27/2019 1332 Last data filed at 02/27/2019 1003 Gross per 24 hour  Intake 957.32 ml  Output 2250 ml  Net -1292.68 ml      Physical Exam:   GENERAL: Pleasant-appearing in no apparent distress.  HEENT: Atraumatic, normocephalic. Extraocular muscles are intact. Pupils equal and reactive to light. Sclerae anicteric. No conjunctival injection. No oro-pharyngeal erythema.  NECK: Supple. There is no jugular venous distention. No bruits, no lymphadenopathy, no thyromegaly.  HEART:  RRR, No murmurs, no rubs, no clicks.  LUNGS: Clear to auscultation bilaterally. No rales or rhonchi. No wheezes.  ABDOMEN: Soft, flat, nontender, nondistended. Has good bowel sounds. No hepatosplenomegaly appreciated.  EXTREMITIES: Left leg is erythematous, edematous, and warm to the touch.  See picture below. NEUROLOGIC: The patient is alert, awake, and oriented x 3 with no focal motor or sensory deficits appreciated bilaterally.  SKIN: Moist and warm with no rashes appreciated.  Psych: Not anxious, depressed        Antibiotics   Anti-infectives (From admission, onward)   Start     Dose/Rate Route Frequency Ordered Stop   02/24/19 2300  vancomycin (VANCOCIN) IVPB 1000 mg/200 mL premix     1,000 mg 200 mL/hr over 60 Minutes Intravenous Every 24 hours 02/23/19 2354     02/23/19 2215  vancomycin (VANCOCIN) 1,750 mg in sodium chloride 0.9 % 500 mL IVPB     1,750 mg 250 mL/hr over 120 Minutes Intravenous  Once 02/23/19 2214 02/24/19 0239   02/23/19 2215  piperacillin-tazobactam (ZOSYN) IVPB 3.375 g     3.375 g 12.5 mL/hr over 240 Minutes Intravenous Every 8 hours 02/23/19 2214     02/23/19 2045  cefTRIAXone (ROCEPHIN) 1 g in sodium chloride 0.9 % 100 mL IVPB     1 g 200 mL/hr over 30 Minutes Intravenous  Once 02/23/19 2031 02/23/19 2221      Medications   Scheduled Meds: . amLODipine   2.5 mg Oral Daily  . aspirin EC  81 mg Oral Daily  . atorvastatin  80 mg Oral Daily  . docusate sodium  100 mg Oral BID  . enoxaparin (LOVENOX) injection  40 mg Subcutaneous Q24H  . insulin aspart  0-5 Units Subcutaneous QHS  . insulin aspart  0-9 Units Subcutaneous TID WC  . losartan  100 mg Oral Daily  . metoprolol tartrate  100 mg Oral BID  . multivitamin with minerals  1 tablet Oral Daily  . pantoprazole  40 mg Oral Daily  . [START ON 03/01/2019] Vitamin D (Ergocalciferol)  50,000 Units Oral Q7 days   Continuous Infusions: . sodium chloride 1,000 mL (02/27/19 0541)  . piperacillin-tazobactam (ZOSYN)  IV 3.375 g (02/27/19 0541)  . vancomycin Stopped (02/26/19 2350)   PRN Meds:.sodium chloride, acetaminophen **OR** acetaminophen, nitroGLYCERIN, ondansetron **OR** ondansetron (ZOFRAN) IV, traMADol, zolpidem   Data Review:   Micro Results Recent Results (from the past 240 hour(s))  SARS Coronavirus 2 [facility] order, Performed in [facility] hospital lab) Nasopharyngeal Nasopharyngeal Swab     Status: None   Collection Time: 02/23/19  5:50 PM   Specimen: Nasopharyngeal Swab  Result Value Ref Range Status   SARS Coronavirus 2 NEGATIVE NEGATIVE Final    Comment: (NOTE) If result is NEGATIVE SARS-CoV-2 target nucleic acids are NOT DETECTED. The SARS-CoV-2 RNA is generally detectable in upper and lower  respiratory specimens during the acute phase of infection. The lowest  concentration of SARS-CoV-2 viral copies this assay can detect is 250  copies / mL. A negative result does not preclude SARS-CoV-2 infection  and should not be used as the sole basis for treatment or other  patient management decisions.  A negative result may occur with  improper specimen collection / handling, submission of specimen other  than nasopharyngeal swab, presence of viral mutation(s) within the  areas targeted by this assay, and inadequate number of viral copies  (<250 copies / mL). A negative  result must be combined with clinical  observations, patient  history, and epidemiological information. If result is POSITIVE SARS-CoV-2 target nucleic acids are DETECTED. The SARS-CoV-2 RNA is generally detectable in upper and lower  respiratory specimens dur ing the acute phase of infection.  Positive  results are indicative of active infection with SARS-CoV-2.  Clinical  correlation with patient history and other diagnostic information is  necessary to determine patient infection status.  Positive results do  not rule out bacterial infection or co-infection with other viruses. If result is PRESUMPTIVE POSTIVE SARS-CoV-2 nucleic acids MAY BE PRESENT.   A presumptive positive result was obtained on the submitted specimen  and confirmed on repeat testing.  While 2019 novel coronavirus  (SARS-CoV-2) nucleic acids may be present in the submitted sample  additional confirmatory testing may be necessary for epidemiological  and / or clinical management purposes  to differentiate between  SARS-CoV-2 and other Sarbecovirus currently known to infect humans.  If clinically indicated additional testing with an alternate test  methodology [id number]) is advised. The SARS-CoV-2 RNA is generally  detectable in upper and lower respiratory sp ecimens during the acute  phase of infection. The expected result is Negative. Fact Sheet for Patients:  StrictlyIdeas.no Fact Sheet for Healthcare Providers: BankingDealers.co.za This test is not yet approved or cleared by the [place] FDA and has been authorized for detection and/or diagnosis of SARS-CoV-2 by FDA under an Emergency Use Authorization (EUA).  This EUA will remain in effect (meaning this test can be used) for the duration of the COVID-19 declaration under Section 564(b)(1) of the Act, 21 U.S.C. section 360bbb-3(b)(1), unless the authorization is terminated or revoked sooner. Performed at  [facility], [address] [place], [place] [address]   Blood culture (routine x 2)     Status: None (Preliminary result)   Collection Time: 02/23/19  7:28 PM   Specimen: BLOOD  Result Value Ref Range Status   Specimen Description BLOOD BLOOD RIGHT WRIST  Final   Special Requests   Final    BOTTLES DRAWN AEROBIC AND ANAEROBIC Blood Culture adequate volume   Culture   Final    NO GROWTH 4 DAYS Performed at [facility], [address] [place], [place] [address]    Report Status PENDING  Incomplete  Blood culture (routine x 2)     Status: None (Preliminary result)   Collection Time: 02/23/19  7:28 PM   Specimen: BLOOD  Result Value Ref Range Status   Specimen Description BLOOD RIGHT ANTECUBITAL  Final   Special Requests   Final    BOTTLES DRAWN AEROBIC AND ANAEROBIC Blood Culture adequate volume   Culture   Final    NO GROWTH 4 DAYS Performed at [facility], [address] [place], [place] [address]    Report Status PENDING  Incomplete    Radiology Reports Dg Chest 1 View  Result Date: 02/23/2019 CLINICAL DATA:  Concern for COVID-19 EXAM: CHEST  1 VIEW COMPARISON:  04/26/2015 FINDINGS: The heart size and mediastinal contours are within normal limits. Both lungs are clear. The visualized skeletal structures are unremarkable. IMPRESSION: No acute abnormality of the lungs in AP portable projection. Electronically Signed   By: Eddie Candle M.D.   On: 02/23/2019 18:17   Ct Abdomen Pelvis W Contrast  Result Date: 02/23/2019 CLINICAL DATA:  Generalized abdominal pain, fever, chills, body aches EXAM: CT ABDOMEN AND PELVIS WITH CONTRAST TECHNIQUE: Multidetector CT imaging of the abdomen and pelvis was performed using the standard protocol following bolus administration of intravenous contrast. CONTRAST:  19mL OMNIPAQUE IOHEXOL 300 MG/ML  SOLN COMPARISON:  05/10/2013.  Ultrasound 09/06/2015. FINDINGS: Lower chest: calcified visualized right coronary  artery. Heart is normal size. Lungs clear. No effusions. Hepatobiliary: Small gallstones within the gallbladder. No focal hepatic abnormality or biliary ductal dilatation. Pancreas: No focal abnormality or ductal dilatation. Spleen: No focal abnormality.  Normal size. Adrenals/Urinary Tract: No adrenal abnormality. No focal renal abnormality. No stones or hydronephrosis. Urinary bladder is unremarkable. 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 Diffuse severe colonic diverticulosis. No active diverticulitis. Small bowel decompressed, unremarkable. Appendix is normal. Vascular/Lymphatic: Diffuse aortic and iliac atherosclerosis. Focal bulge in the infrarenal aorta measures 3 cm. No adenopathy. Reproductive: Prior hysterectomy.  No adnexal masses. Other: No free fluid or free air. Musculoskeletal: No acute bony abnormality. IMPRESSION: No acute findings in the abdomen or pelvis. Diffuse colonic diverticulosis.  No active diverticulitis. Cholelithiasis.  No CT evidence for cholecystitis. Aortic atherosclerosis.  Coronary artery disease. 3 cm infrarenal abdominal aortic aneurysm.Recommend followup by ultrasound in 3 years. This recommendation follows ACR consensus guidelines: White Paper of the ACR Incidental Findings Committee II on Vascular Findings. J Am Coll Radiol 2013; 10:789-794. Aortic aneurysm NOS (ICD10-I71.9) Electronically Signed   By: Rolm Baptise M.D.   On: 02/23/2019 19:48     CBC Recent Labs  Lab 02/23/19 1750 02/25/19 0450  WBC 14.0* 10.1  HGB 14.4 12.6  HCT 44.8 39.4  PLT 136* 110*  MCV 84.2 84.2  MCH 27.1 26.9  MCHC 32.1 32.0  RDW 13.7 14.3  LYMPHSABS 0.7  --   MONOABS 1.1*  --   EOSABS 0.0  --   BASOSABS 0.0  --     Chemistries  Recent Labs  Lab 02/23/19 1750 02/25/19 0450 02/26/19 0612 02/27/19 0346  NA 135 134*  --   --   K 3.5 3.5  --   --   CL 101 106  --   --   CO2 21* 17*  --   --   GLUCOSE 188* 105*  --   --   BUN  17 10  --   --   CREATININE 0.85 0.61 0.63 0.76  CALCIUM 9.1 7.9*  --   --   AST 30  --   --   --   ALT 33  --   --   --   ALKPHOS 57  --   --   --   BILITOT 0.5  --   --   --    ------------------------------------------------------------------------------------------------------------------ estimated creatinine clearance is 66.8 mL/min (by C-G formula based on SCr of 0.76 mg/dL). ------------------------------------------------------------------------------------------------------------------ No results for input(s): HGBA1C in the last 72 hours. ------------------------------------------------------------------------------------------------------------------ No results for input(s): CHOL, HDL, LDLCALC, TRIG, CHOLHDL, LDLDIRECT in the last 72 hours. ------------------------------------------------------------------------------------------------------------------ No results for input(s): TSH, T4TOTAL, T3FREE, THYROIDAB in the last 72 hours.  Invalid input(s): FREET3 ------------------------------------------------------------------------------------------------------------------ No results for input(s): VITAMINB12, FOLATE, FERRITIN, TIBC, IRON, RETICCTPCT in the last 72 hours.  Coagulation profile No results for input(s): INR, PROTIME in the last 168 hours.  No results for input(s): DDIMER in the last 72 hours.  Cardiac Enzymes No results for input(s): CKMB, TROPONINI, MYOGLOBIN in the last 168 hours.  Invalid input(s): CK ------------------------------------------------------------------------------------------------------------------ Invalid input(s): [place]   Left lower extremity cellulitis- initially meeting sepsis criteria on admission with fever, tachycardia, leukocytosis and lactic acidosis, but sepsis has resolved.   -Narrow antibiotics to ceftriaxone -Blood cultures with no growth  Coronary artery  disease-stable, no active chest pain. -Continue  home aspirin  Hypertension-BPs have improved -Continue losartan, metoprolol and amlodipine -Labetalol IV as needed  Type 2 diabetes  -Continue SSI  Hyperlipidemia -Continue statin  Chronic thrombocytopenia- platelets are at baseline -Follow-up as an outpatient  DVT prophylaxis-Lovenox     Code Status Orders  (From admission, onward)         Start     Ordered   02/24/19 0030  Full code  Continuous     02/24/19 0029        Code Status History    Date Active Date Inactive Code Status Order ID Comments User Context   10/09/2014 1824 10/10/2014 1444 Full Code [id number]  Leonie Man, MD Inpatient   10/09/2014 0025 10/09/2014 1824 Full Code [id number]  Deneise Lever, MD ED   01/05/2014 1206 01/06/2014 0336 Full Code [id number]  Jacqulynn Cadet, MD [facility]   Advance Care Planning Activity     Consults: None  DVT Prophylaxis:  Lovenox  Lab Results  Component Value Date   PLT 110 (L) 02/25/2019     Time Spent in minutes 47min   Greater than 50% of time spent in care coordination and counseling patient regarding the condition and plan of care.   Berna Spare Jaxxen Voong M.D on 02/27/2019 at 1:32 PM  Between 7am to 6pm - Pager - [phone]  After 6pm go to [email or web address] - Proofreader  [employer]   Office  [phone]

## 2019-02-28 ENCOUNTER — Inpatient Hospital Stay: Payer: BC Managed Care – PPO

## 2019-02-28 LAB — CULTURE, BLOOD (ROUTINE X 2)
Culture: NO GROWTH
Culture: NO GROWTH
Special Requests: ADEQUATE
Special Requests: ADEQUATE

## 2019-02-28 LAB — GLUCOSE, CAPILLARY
Glucose-Capillary: 115 mg/dL — ABNORMAL HIGH (ref 70–99)
Glucose-Capillary: 120 mg/dL — ABNORMAL HIGH (ref 70–99)

## 2019-02-28 MED ORDER — DOXYCYCLINE HYCLATE 100 MG PO TBEC: 100.0000 mg | DELAYED_RELEASE_TABLET | Freq: Two times a day (BID) | ORAL | 0 refills | Status: DC

## 2019-02-28 NOTE — TOC Transition Note (Signed)
Transition of Care [facility]) - CM/SW Discharge Note   Patient Details  Name: Hayley Hogan MRN: [id number] Date of Birth: Sep 02, 1945  [facility] CM/SW Contact:  Elza Rafter, RN Phone Number: 02/28/2019, 10:49 AM   Clinical Narrative:   Patient discharging to home today.  Independent in all ADL's.  Current with Dr. Sharlett Iles for PCP.  Obtains medications at [facility] without difficulty.  Has a walker at home if needed.  Offered home health PT and patient states she does not want any services at this time.  No further needs identified by TOC.    Final next level of care: Home/Self Care Barriers to Discharge: No Barriers Identified   Patient Goals and CMS Choice        Discharge Placement                       Discharge Plan and Services   Discharge Planning Services: CM Consult                      HH Arranged: Refused HH          Social Determinants of Health (SDOH) Interventions     Readmission Risk Interventions No flowsheet data found.

## 2019-02-28 NOTE — Discharge Instructions (Signed)
It was so nice to meet you during this hospitalization!  You came into the hospital with a bacterial infection of your left lower leg. We gave you some antibiotics for your IV. I have sent in a prescription for you to continue antibiotics at home. Please take Doxycycline 100mg  once tonight and then continue twice a day for 4 more days.  Please try to wear your compression stockings during the day and then take them off at night when you go to bed. You should try to keep your legs elevated as much as possible when you are sitting during the day.  Take care, Dr. Brett Albino

## 2019-02-28 NOTE — Discharge Summary (Signed)
[facility] [place] NAME: Cecily Stpeter    MR#:  [id number]  DATE OF BIRTH:  1945-12-23  DATE OF ADMISSION:  02/23/2019   ADMITTING PHYSICIAN: Harrie Foreman, MD  DATE OF DISCHARGE: 02/28/19  PRIMARY CARE PHYSICIAN: Leanna Battles, MD   ADMISSION DIAGNOSIS:  Sepsis, due to unspecified organism, unspecified whether acute organ dysfunction present ([place]) [A41.9] DISCHARGE DIAGNOSIS:  Active Problems:   Sepsis ([place])  SECONDARY DIAGNOSIS:   Past Medical History:  Diagnosis Date   DM type 2 (diabetes mellitus, type 2) ([place])    Fatty liver disease, nonalcoholic    Hypertension    Migraines    "when I was very young; before 1968"   Neuromuscular disorder ([place])    NSTEMI (non-ST elevated myocardial infarction) ([place])    Rheumatoid arthritis(714.0)    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  In the ED, she was meeting sepsis criteria with fever, tachycardia, leukocytosis, and lactic acidosis.  She is started on broad-spectrum antibiotics.  She was admitted for further management.  Left lower extremity cellulitis- initially meeting sepsis criteria on admission with fever, tachycardia, leukocytosis and lactic acidosis, but sepsis has resolved.   -Discharged home on doxycycline for a total 10-day course of antibiotics -Blood cultures with no growth -Left lower extremity duplex ultrasound was negative for DVT -Advised patient to use contact pression stocking during the day and to keep legs elevated as much as possible -PT recommended home health PT, but patient declined.  Coronary artery disease-stable, no active chest pain. -Continued home aspirin  Hypertension-BPs well-controlled at the time of discharge -Continued losartan, metoprolol and amlodipine  Type  2 diabetes  -Home diabetes medicines were continued on discharge  Hyperlipidemia -Continued statin  Chronic thrombocytopenia- platelets are at baseline -Follow-up as an outpatient  DISCHARGE CONDITIONS:  Left lower extremity cellulitis Coronary artery disease Hypertension Type 2 diabetes Hyperlipidemia Chronic thrombocytopenia CONSULTS OBTAINED:  None DRUG ALLERGIES:   Allergies  Allergen Reactions   Demerol Other (See Comments)    vomiting   Peanut-Containing Drug Products     Mouth swelling   DISCHARGE MEDICATIONS:   Allergies as of 02/28/2019      Reactions   Demerol Other (See Comments)   vomiting   Peanut-containing Drug Products    Mouth swelling      Medication List    TAKE these medications   amLODipine 2.5 MG tablet Commonly known as: NORVASC Take 2.5 mg by mouth daily.   aspirin 81 MG tablet Take 1 tablet (81 mg total) by mouth daily.   atorvastatin 80 MG tablet Commonly known as: LIPITOR Take 1 tablet (80 mg total) by mouth daily. CALL FOR APPOINTMENT   b complex vitamins capsule Take 1 capsule by mouth daily.   doxycycline 100 MG EC tablet Commonly known as: DORYX Take 1 tablet (100 mg total) by mouth 2 (two) times daily.   Jardiance 25 MG Tabs tablet Generic drug: empagliflozin Take 25 mg by mouth daily.   losartan 100 MG tablet Commonly known as: COZAAR Take 100 mg by mouth daily.   metFORMIN 500 MG 24 hr tablet Commonly known as: GLUCOPHAGE-XR Take 1,000 mg by mouth 2 (two) times daily.   metoprolol tartrate 100 MG tablet Commonly known as: LOPRESSOR Take 100 mg by mouth 2 (two)  times daily.   nitroGLYCERIN 0.4 MG SL tablet Commonly known as: NITROSTAT Place 1 tablet (0.4 mg total) under the tongue every 5 (five) minutes as needed for chest pain.   omeprazole 20 MG capsule Commonly known as: PRILOSEC Take 20 mg by mouth 2 (two) times daily.   predniSONE 10 MG tablet Commonly known as: DELTASONE Take 10 mg by mouth  as needed (for flare ups).   traMADol 50 MG tablet Commonly known as: ULTRAM Take 50 mg by mouth every 6 (six) hours as needed for moderate pain.   Vitamin D (Ergocalciferol) 1.25 MG (50000 UT) Caps capsule Commonly known as: DRISDOL Take 50,000 Units by mouth every 7 (seven) days. Wednesday            Durable Medical Equipment  (From admission, onward)         Start     Ordered   02/28/19 0741  For home use only DME Walker rolling  Once    Question:  Patient needs a walker to treat with the following condition  Answer:  Left leg cellulitis   02/28/19 0740   02/26/19 0728  For home use only DME Walker rolling  Once    Question:  Patient needs a walker to treat with the following condition  Answer:  Left leg cellulitis   02/26/19 0728           DISCHARGE INSTRUCTIONS:  1.  Follow-up with PCP in 5 days 2.  Take doxycycline twice a day to complete course of antibiotics DIET:  Cardiac diet and Diabetic diet DISCHARGE CONDITION:  Stable ACTIVITY:  Activity as tolerated OXYGEN:  Home Oxygen: No.  Oxygen Delivery: room air DISCHARGE LOCATION:  home   If you experience worsening of your admission symptoms, develop shortness of breath, life threatening emergency, suicidal or homicidal thoughts you must seek medical attention immediately by calling 911 or calling your MD immediately  if symptoms less severe.  You Must read complete instructions/literature along with all the possible adverse reactions/side effects for all the Medicines you take and that have been prescribed to you. Take any new Medicines after you have completely understood and accpet all the possible adverse reactions/side effects.   Please note  You were cared for by a hospitalist during your hospital stay. If you have any questions about your discharge medications or the care you received while you were in the hospital after you are discharged, you can call the unit and asked to speak with the  hospitalist on call if the hospitalist that took care of you is not available. Once you are discharged, your primary care physician will handle any further medical issues. Please note that NO REFILLS for any discharge medications will be authorized once you are discharged, as it is imperative that you return to your primary care physician (or establish a relationship with a primary care physician if you do not have one) for your aftercare needs so that they can reassess your need for medications and monitor your lab values.    On the day of Discharge:  VITAL SIGNS:  Blood pressure 132/81, pulse 71, temperature 98.5 F (36.9 C), temperature source Oral, resp. rate 20, height 5' 4.96" (1.65 m), weight 74.4 kg, SpO2 96 %. PHYSICAL EXAMINATION:  GENERAL:  73 y.o.-year-old patient lying in the bed with no acute distress.  Well-appearing. EYES: Pupils equal, round, reactive to light and accommodation. No scleral icterus. Extraocular muscles intact.  HEENT: Head atraumatic, normocephalic. Oropharynx and nasopharynx clear.  NECK:  Supple, no jugular venous distention. No thyroid enlargement, no tenderness.  LUNGS: Normal breath sounds bilaterally, no wheezing, rales,rhonchi or crepitation. No use of accessory muscles of respiration.  CARDIOVASCULAR: RRR, S1, S2 normal. No murmurs, rubs, or gallops.  ABDOMEN: Soft, non-tender, non-distended. Bowel sounds present. No organomegaly or mass.  EXTREMITIES: No pedal edema, cyanosis, or clubbing. + Erythema, edema, and warmth of the left lower extremity, much improved from yesterday. NEUROLOGIC: Cranial nerves II through XII are intact. Muscle strength 5/5 in all extremities. Sensation intact. Gait not checked.  PSYCHIATRIC: The patient is alert and oriented x 3.  SKIN: No obvious rash, lesion, or ulcer.     DATA REVIEW:   CBC Recent Labs  Lab 02/25/19 0450  WBC 10.1  HGB 12.6  HCT 39.4  PLT 110*    Chemistries  Recent Labs  Lab 02/23/19 1750  02/25/19 0450  02/27/19 0346  NA 135 134*  --   --   K 3.5 3.5  --   --   CL 101 106  --   --   CO2 21* 17*  --   --   GLUCOSE 188* 105*  --   --   BUN 17 10  --   --   CREATININE 0.85 0.61   < > 0.76  CALCIUM 9.1 7.9*  --   --   AST 30  --   --   --   ALT 33  --   --   --   ALKPHOS 57  --   --   --   BILITOT 0.5  --   --   --    < > = values in this interval not displayed.     Microbiology Results  Results for orders placed or performed during the hospital encounter of 02/23/19  SARS Coronavirus 2 [facility] order, Performed in [facility] hospital lab) Nasopharyngeal Nasopharyngeal Swab     Status: None   Collection Time: 02/23/19  5:50 PM   Specimen: Nasopharyngeal Swab  Result Value Ref Range Status   SARS Coronavirus 2 NEGATIVE NEGATIVE Final    Comment: (NOTE) If result is NEGATIVE SARS-CoV-2 target nucleic acids are NOT DETECTED. The SARS-CoV-2 RNA is generally detectable in upper and lower  respiratory specimens during the acute phase of infection. The lowest  concentration of SARS-CoV-2 viral copies this assay can detect is 250  copies / mL. A negative result does not preclude SARS-CoV-2 infection  and should not be used as the sole basis for treatment or other  patient management decisions.  A negative result may occur with  improper specimen collection / handling, submission of specimen other  than nasopharyngeal swab, presence of viral mutation(s) within the  areas targeted by this assay, and inadequate number of viral copies  (<250 copies / mL). A negative result must be combined with clinical  observations, patient history, and epidemiological information. If result is POSITIVE SARS-CoV-2 target nucleic acids are DETECTED. The SARS-CoV-2 RNA is generally detectable in upper and lower  respiratory specimens dur ing the acute phase of infection.  Positive  results are indicative of active infection with SARS-CoV-2.  Clinical  correlation with patient history  and other diagnostic information is  necessary to determine patient infection status.  Positive results do  not rule out bacterial infection or co-infection with other viruses. If result is PRESUMPTIVE POSTIVE SARS-CoV-2 nucleic acids MAY BE PRESENT.   A presumptive positive result was obtained on the submitted specimen  and  confirmed on repeat testing.  While 2019 novel coronavirus  (SARS-CoV-2) nucleic acids may be present in the submitted sample  additional confirmatory testing may be necessary for epidemiological  and / or clinical management purposes  to differentiate between  SARS-CoV-2 and other Sarbecovirus currently known to infect humans.  If clinically indicated additional testing with an alternate test  methodology [phone]) is advised. The SARS-CoV-2 RNA is generally  detectable in upper and lower respiratory sp ecimens during the acute  phase of infection. The expected result is Negative. Fact Sheet for Patients:  StrictlyIdeas.no Fact Sheet for Healthcare Providers: BankingDealers.co.za This test is not yet approved or cleared by the [place] FDA and has been authorized for detection and/or diagnosis of SARS-CoV-2 by FDA under an Emergency Use Authorization (EUA).  This EUA will remain in effect (meaning this test can be used) for the duration of the COVID-19 declaration under Section 564(b)(1) of the Act, 21 U.S.C. section 360bbb-3(b)(1), unless the authorization is terminated or revoked sooner. Performed at [facility], [address] [place], [place] [address]   Blood culture (routine x 2)     Status: None   Collection Time: 02/23/19  7:28 PM   Specimen: BLOOD  Result Value Ref Range Status   Specimen Description BLOOD BLOOD RIGHT WRIST  Final   Special Requests   Final    BOTTLES DRAWN AEROBIC AND ANAEROBIC Blood Culture adequate volume   Culture   Final    NO GROWTH 5 DAYS Performed at [facility], [address] [place], [place] [address]    Report Status 02/28/2019 FINAL  Final  Blood culture (routine x 2)     Status: None   Collection Time: 02/23/19  7:28 PM   Specimen: BLOOD  Result Value Ref Range Status   Specimen Description BLOOD RIGHT ANTECUBITAL  Final   Special Requests   Final    BOTTLES DRAWN AEROBIC AND ANAEROBIC Blood Culture adequate volume   Culture   Final    NO GROWTH 5 DAYS Performed at [facility], [address], [place], [place] [address]    Report Status 02/28/2019 FINAL  Final    RADIOLOGY:  US Venous Img Lower Unilateral Left  Result Date: 02/28/2019 CLINICAL DATA:  Left leg pain and edema EXAM: LEFT LOWER EXTREMITY VENOUS DOPPLER ULTRASOUND TECHNIQUE: Gray-scale sonography with graded compression, as well as color Doppler and duplex ultrasound were performed to evaluate the lower extremity deep venous systems from the level of the common femoral vein and including the common femoral, femoral, profunda femoral, popliteal and calf veins including the posterior tibial, peroneal and gastrocnemius veins when visible. The superficial great saphenous vein was also interrogated. Spectral Doppler was utilized to evaluate flow at rest and with distal augmentation maneuvers in the common femoral, femoral and popliteal veins. COMPARISON:  None. FINDINGS: Contralateral Common Femoral Vein: Respiratory phasicity is normal and symmetric with the symptomatic side. No evidence of thrombus. Normal compressibility. Common Femoral Vein: No evidence of thrombus. Normal compressibility, respiratory phasicity and response to augmentation. Saphenofemoral Junction: No evidence of thrombus. Normal compressibility and flow on color Doppler imaging. Profunda Femoral Vein: No evidence of thrombus. Normal compressibility and flow on color Doppler imaging. Femoral Vein: No evidence of thrombus. Normal compressibility, respiratory phasicity and response to  augmentation. Popliteal Vein: No evidence of thrombus. Normal compressibility, respiratory phasicity and response to augmentation. Calf Veins: Limited visualization of the calf veins. No gross thrombus evident. Superficial Great Saphenous Vein: No evidence  of thrombus. Normal compressibility. Venous Reflux:  Not assessed Other Findings:  Peripheral extremity edema noted. IMPRESSION: No evidence of deep venous thrombosis. Electronically Signed   By: Jerilynn Mages.  Shick M.D.   On: 02/28/2019 08:42     Management plans discussed with the patient, family and they are in agreement.  CODE STATUS: Full Code   TOTAL TIME TAKING CARE OF THIS PATIENT: 45 minutes.    Berna Spare Lashea Goda M.D on 02/28/2019 at 10:43 AM  Between 7am to 6pm - Pager - [phone]  After 6pm go to [email or web address] - Proofreader  [facility]  Office  [phone]  CC: Primary care physician; Leanna Battles, MD   Note: This dictation was prepared with Dragon dictation along with smaller phrase technology. Any transcriptional errors that result from this process are unintentional.

## 2019-03-15 ENCOUNTER — Other Ambulatory Visit: Payer: Self-pay | Admitting: Internal Medicine

## 2019-03-24 ENCOUNTER — Other Ambulatory Visit: Payer: Self-pay | Admitting: Internal Medicine

## 2019-03-27 ENCOUNTER — Encounter: Payer: Self-pay | Admitting: Internal Medicine

## 2019-03-27 LAB — IFOBT (OCCULT BLOOD): IFOBT: NEGATIVE

## 2019-04-13 ENCOUNTER — Other Ambulatory Visit: Payer: Self-pay | Admitting: Internal Medicine

## 2019-05-04 ENCOUNTER — Encounter: Payer: Self-pay | Admitting: Internal Medicine

## 2019-05-04 ENCOUNTER — Ambulatory Visit: Payer: BC Managed Care – PPO | Admitting: Internal Medicine

## 2019-05-04 ENCOUNTER — Other Ambulatory Visit: Payer: Self-pay

## 2019-05-04 VITALS — BP 110/77 | HR 66 | Temp 97.0°F | Ht 67.5 in | Wt 164.0 lb

## 2019-05-04 DIAGNOSIS — I10 Essential (primary) hypertension: Secondary | ICD-10-CM | POA: Diagnosis not present

## 2019-05-04 DIAGNOSIS — E782 Mixed hyperlipidemia: Secondary | ICD-10-CM | POA: Diagnosis not present

## 2019-05-04 DIAGNOSIS — I2510 Atherosclerotic heart disease of native coronary artery without angina pectoris: Secondary | ICD-10-CM

## 2019-05-04 DIAGNOSIS — I739 Peripheral vascular disease, unspecified: Secondary | ICD-10-CM | POA: Diagnosis not present

## 2019-05-04 NOTE — Patient Instructions (Signed)
Medication Instructions:  Your physician recommends that you continue on your current medications as directed. Please refer to the Current Medication list given to you today.  *If you need a refill on your cardiac medications before your next appointment, please call your pharmacy*  Lab Work: NONE If you have labs (blood work) drawn today and your tests are completely normal, you will receive your results only by: Marland Kitchen MyChart Message (if you have MyChart) OR . A paper copy in the mail If you have any lab test that is abnormal or we need to change your treatment, we will call you to review the results.  Testing/Procedures: NONE  Follow-Up: At [facility], you and your health needs are our priority.  As part of our continuing mission to provide you with exceptional heart care, we have created designated Provider Care Teams.  These Care Teams include your primary Cardiologist (physician) and Advanced Practice Providers (APPs -  Physician Assistants and Nurse Practitioners) who all work together to provide you with the care you need, when you need it.  Your next appointment:   12 month(s)  The format for your next appointment:   In Person  Provider:   You may see Dr. Debara Pickett or one of the following Advanced Practice Providers on your designated Care Team:    Almyra Deforest, PA-C  Fabian Sharp, [place] or   Roby Lofts, [place]   Other Instructions

## 2019-05-04 NOTE — Progress Notes (Signed)
OFFICE NOTE  Chief Complaint:  No complaints  Primary Care Physician: Leanna Battles, MD  HPI:  Hayley Hogan is a pleasant 73 year old female he referred to me by Dr. Sharlett Iles. Her past history significant for well-controlled diabetes, rheumatoid arthritis, dyslipidemia and hypertension. She's also been a long-time smoker. Last year she underwent a CT scan in the setting of unexplained bleeding. This demonstrated coronary artery calcium, particularly in the right coronary artery which was significant. Recently she's been having some chest discomfort. She feels like she has a tightness in the center of her chest. The symptoms do not radiate and are sometimes associated with exertion and relieved by rest. Occasionally she feels like she needs to belch and that does relieve her symptoms. She's also reported some shortness of breath.  Hayley Hogan returns today for follow-up. Her stress test was negative for ischemia. She does have coronary artery calcium as well as calcified atherosclerosis of the distal abdominal aorta and iliac bifurcation. There is disease distending into the iliofemoral arteries bilaterally. She denies any claudication symptoms. She reports since her last visit her chest pain symptoms have improved. She thinks is mainly just been related to stress and he continued workup she had to undergo after her abdominal CT demonstrated a number of abnormalities.  Has a pleasure seeing Hayley Hogan back in the office today. When I last saw her she underwent stress testing which was negative for ischemia. I reassured her although did feel that she has coronary disease and that she needed risk modification. She also needed to work on tobacco cessation. Unfortunately several months later she presented with unstable angina and underwent cardiac catheterization. This demonstrated a severe lesion in the right coronary artery for which she received a Stent: Xience Alpine DES 3.5 mm x 38 mm.   since that time, she's had no further chest pain, but she did have some shortness of breath. Recently she saw Cecilie Kicks, FN-P, for hospital follow-up. An echocardiogram was ordered which showed normal systolic function. It was thought that maybe her shortness of breath was related to Brilinta. She was also describing palpitations and wore a monitor which I evaluated and demonstrated no evidence of tachyarrhythmias or extrasystoles. That seems to now have subsided. Fortunately she stop smoking and is been off cigarettes now for 3 months. Finally she is complaining of some acid indigestion, which he thinks may be related to her blood thinners.  At the pleasure seeing Hayley Hogan back today in the office. She reports that she is doing fairly well although she's had some negative chest discomfort. She said it started last summer and happens infrequently, but seems to be more associated with activity. In addition she has some left arm discomfort. She continues to have a low level of shortness of breath which is improved somewhat. She thinks it might be related to Brilinta. We discussed this previously and she notes that on days when she takes her morning dose of Brilinta later and then takes her normal evening dose, she feels more short of breath. Of note, her coronary intervention was on 10/09/2014, therefore she would require to remain on Brilinta only until 10/09/2015, which is less than one month from now. Today she reported some discomfort in her chest and in EKG shows anterolateral T-wave inversions in lead V2 through V6 which are new compared to her prior EKG. Of note, her previous dyspepsia improved with an H2 blocker but then reoccurred. Her primary care provider put her on a PPI and  this is now resolved her symptoms completely.  10/29/2015  Hayley Hogan returns today for follow-up. She underwent a nuclear stress test which demonstrated no ischemia and a normal LVEF of 70%. She reports her symptoms have  resolved and not recurred. At this point she does not feel that it was a cardiac chest point. I think it is also unlikely based on her recent stent to the fact she was on dual antiplatelet therapy. We did discuss that it's been more than a year since her stent was placed and she is now a candidate for discontinuing Brilinta.  12/01/2016  Hayley Hogan was seen in follow-up today. Her only concern is some chronic left leg swelling. 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 She had an episode of chest discomfort around Thanksgiving but she said it resolved after some BC powder. She's not had any recurrence since then. Cholesterol is been well controlled with LDL-C of 64 one year ago. She is due for repeat check today. Blood pressure is well controlled 120/84. EKG which is personally reviewed shows sinus bradycardia and anterolateral T-wave inversions which are unchanged from prior study. She recently stopped methotrexate and plaque were no after her rheumatologist Dr. Charlestine Night retired. She says that there has been some debate about whether or not she actually had rheumatoid arthritis or not she says that the side effects of her medications were more intolerable than any benefit she thought she got from them.  02/03/2018  Hayley Hogan was seen today in follow-up.  Overall she seems to be doing well.  She denies any chest pain or worsening shortness of breath.  She occasionally gets flares of her RA.  She is not on any DMARDs at this point because she had significant side effects.  She only uses a prednisone as needed.  She understands that this could lead to irreversible joint destruction however she is without any significant pain at this point and felt that the side effects of her rheumatoid medications were worse than the pain of her RA.  She continues to have some chronic left leg swelling.  I have advised 36mmHg compression stockings to left lower extremity.  This will be  helpful to wear while she is driving.  She did undergo stress test this year for DOT purposes which was negative for ischemia.  She said this may be her last year of driving for work.  Labs reviewed from August 2018 showed total cholesterol 196, HDL 47, LDL 114 and triglycerides 176.  Hemoglobin A1c was elevated 8.  She is not yet at goal and will need a repeat lipid profile.  Her goal LDL is less than 70.  She is on moderate intensity statin and may need to be intensified.  05/04/2019  Hayley Hogan returns today for follow-up.  Unfortunately recently she was hospitalized at [facility] for acute sepsis.  This is likely secondary to left leg cellulitis which developed.  She has had problems with swelling in that leg.  This required ICU stay and IV antibiotics but ultimately she recovered.  Besides that she seems to be doing well.  She denies any chest pain or worsening shortness of breath.  Labs from recent office visit show total cholesterol 126, triglycerides 156, HDL 42 and LDL of 53.  EKG shows sinus rhythm.  Blood pressure is excellent today at 110/77.  PMHx:  Past Medical History:  Diagnosis Date  . DM type 2 (diabetes mellitus, type 2) ([place])   .  Fatty liver disease, nonalcoholic   . Hypertension   . Migraines    "when I was very young; before 1968"  . Neuromuscular disorder ([place])   . NSTEMI (non-ST elevated myocardial infarction) ([place])   . Rheumatoid arthritis(714.0)   . Transverse myelitis ([place]) 1979   w/transient inability to walk    Past Surgical History:  Procedure Laterality Date  . BREAST LUMPECTOMY  1970   right  . COLONOSCOPY N/A 05/11/2013   Procedure: COLONOSCOPY;  Surgeon: Ladene Artist, MD;  Location: [facility] ENDOSCOPY;  Service: Endoscopy;  Laterality: N/A;  . CORONARY ANGIOPLASTY WITH STENT PLACEMENT  10/09/14   Xience DES to RCA  . FRACTURE SURGERY     plates and screws in right ankle  . LEFT HEART CATHETERIZATION WITH CORONARY ANGIOGRAM N/A 10/09/2014   Procedure: LEFT  HEART CATHETERIZATION WITH CORONARY ANGIOGRAM;  Surgeon: Leonie Man, MD;  Location: [facility] CATH LAB;  Service: Cardiovascular;  Laterality: N/A;  . ORIF ANKLE FRACTURE  09/2008   right  . TUBAL LIGATION    . VAGINAL HYSTERECTOMY  1982    FAMHx:  Family History  Problem Relation Age of Onset  . Hypertension Mother   . Cancer Father   . Heart Problems Maternal Grandfather   . Cancer Brother   . Cancer Brother   . Other Brother        GSW  . Stroke Sister        x2  . Hypertension Sister        x2  . COPD Sister     SOCHx:   reports that she quit smoking about 4 years ago. Her smoking use included cigarettes. She has a 7.00 pack-year smoking history. She has never used smokeless tobacco. She reports current alcohol use of about 1.0 standard drinks of alcohol per week. She reports current drug use. Drug: Marijuana.  ALLERGIES:  Allergies  Allergen Reactions  . Demerol Other (See Comments)    vomiting  . Peanut-Containing Drug Products     Mouth swelling    ROS: Pertinent items noted in HPI and remainder of comprehensive ROS otherwise negative.  HOME MEDS: Current Outpatient Medications  Medication Sig Dispense Refill  . amLODipine (NORVASC) 2.5 MG tablet Take 2.5 mg by mouth daily.    Marland Kitchen aspirin 81 MG tablet Take 1 tablet (81 mg total) by mouth daily. 30 tablet 3  . atorvastatin (LIPITOR) 80 MG tablet TAKE 1 TABLET (80 MG TOTAL) BY MOUTH DAILY. CALL FOR APPOINTMENT-2ND ATTEMPT 30 tablet 0  . b complex vitamins capsule Take 1 capsule by mouth daily.    . empagliflozin (JARDIANCE) 25 MG TABS tablet Take 25 mg by mouth daily.     Marland Kitchen losartan (COZAAR) 100 MG tablet Take 100 mg by mouth daily.    . metFORMIN (GLUCOPHAGE-XR) 500 MG 24 hr tablet Take 1,000 mg by mouth 2 (two) times daily.   2  . metoprolol (LOPRESSOR) 100 MG tablet Take 100 mg by mouth 2 (two) times daily.    . nitroGLYCERIN (NITROSTAT) 0.4 MG SL tablet Place 1 tablet (0.4 mg total) under the tongue every 5  (five) minutes as needed for chest pain. 90 tablet 3  . omeprazole (PRILOSEC) 20 MG capsule Take 20 mg by mouth 2 (two) times daily.  4  . predniSONE (DELTASONE) 10 MG tablet Take 10 mg by mouth as needed (for flare ups).    . traMADol (ULTRAM) 50 MG tablet Take 50 mg by mouth every 6 (six) hours  as needed for moderate pain.     . Vitamin D, Ergocalciferol, (DRISDOL) 50000 UNITS CAPS capsule Take 50,000 Units by mouth every 7 (seven) days. Wednesday    . doxycycline (DORYX) 100 MG EC tablet Take 1 tablet (100 mg total) by mouth 2 (two) times daily. (Patient not taking: Reported on 05/04/2019) 9 tablet 0   No current facility-administered medications for this visit.     LABS/IMAGING: No results found for this or any previous visit (from the past 48 hour(s)). No results found.  VITALS: BP 110/77   Pulse 66   Temp (!) 97 F (36.1 C)   Ht 5' 7.5" (1.715 m)   Wt 164 lb (74.4 kg)   SpO2 99%   BMI 25.31 kg/m   EXAM: General appearance: alert, appears stated age and no distress Neck: no carotid bruit and no JVD Lungs: clear to auscultation bilaterally Heart: regular rate and rhythm Abdomen: soft, non-tender; bowel sounds normal; no masses,  no organomegaly Extremities: edema Trace left lower extremity edema Pulses: 2+ and symmetric Skin: Skin color, texture, turgor normal. No rashes or lesions Neurologic: Grossly normal Psych: Pleasant  EKG: Normal sinus rhythm at 62-personally reviewed  ASSESSMENT: 1. Chest pain with no ischemic EKG changes - low risk Myoview, EF 70% (2017) 2. CAD - s/p PCI to the RCA with a 3.5 x 38 mm DES (10/09/2014) 3. Elevated coronary artery calcium score 4. Rheumatoid arthritis 5. Diabetes type 2 6. Dyslipidemia 7. Hypertension 8. Tobacco abuse - now quit x 3 months 9. Peripheral arterial disease 10. GERD  PLAN: 1.   Hayley Hogan is doing well although had recent sepsis for which she is recovered.  Blood pressure is well controlled now.  Her  cholesterol is reasonably good.  She has RA which is quiesced sent at that time.  No new chest pain symptoms.  LVEF was normal.  She has maintained tobacco cessation.  No changes to her medicines today.  Plan follow-up annually sooner as necessary.  Pixie Casino, MD, [facility], [place] Director of the [facility]  [facility] Diplomate of the American Board of Clinical Lipidology Attending Cardiologist  Direct Dial: [phone]  Fax: [phone]  Website:  [email or web address] Koelling 05/04/2019, 10:43 AM

## 2019-05-11 ENCOUNTER — Other Ambulatory Visit: Payer: Self-pay | Admitting: Internal Medicine

## 2020-02-18 IMAGING — US US EXTREM LOW VENOUS*L*
1 series · 13 of 24 positions shown · non-contrast
Comparison: None.

CLINICAL DATA: Left leg pain and edema



[Series 1: us extrem low venous*left* · 13 of 37 slices shown]
[im 1/37]
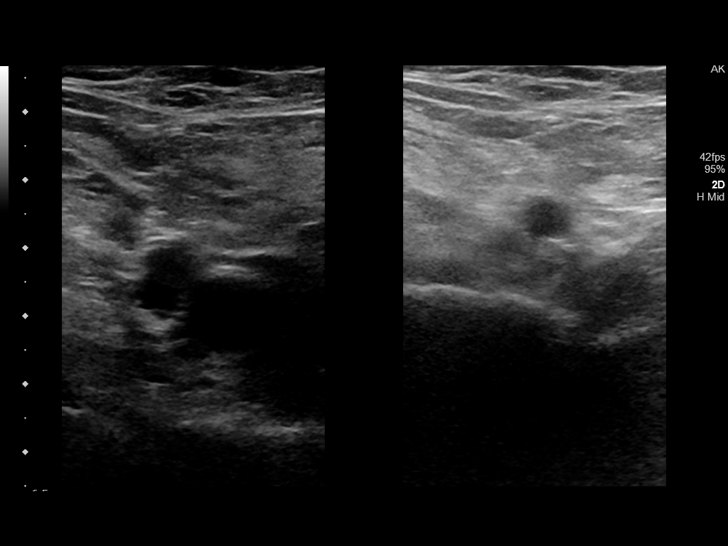
[im 4/37]
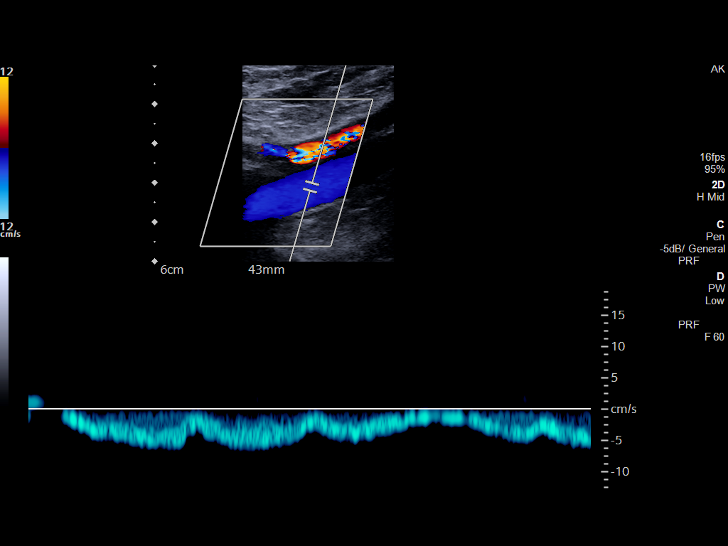
[im 7/37]
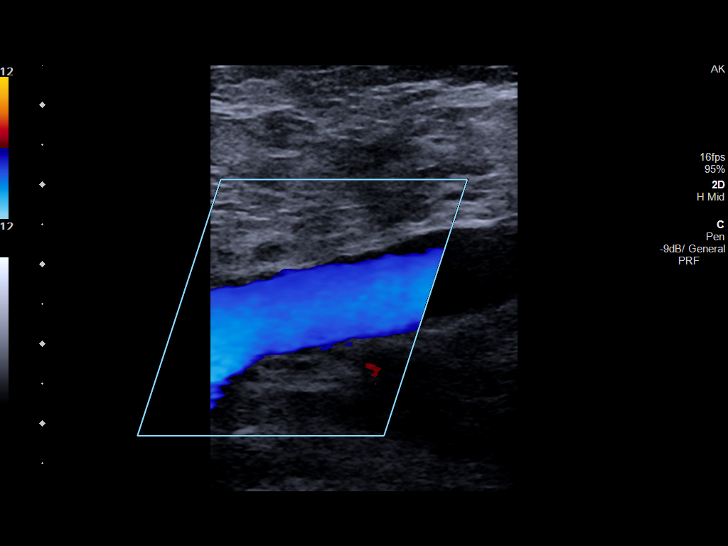
[im 10/37]
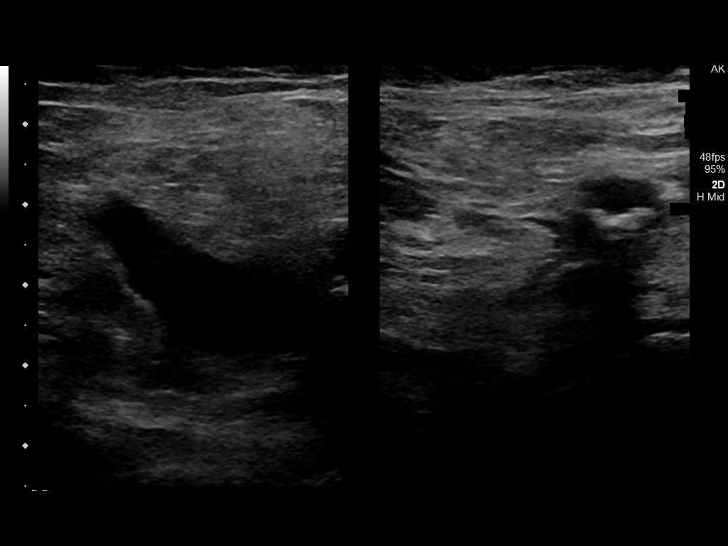
[im 13/37]
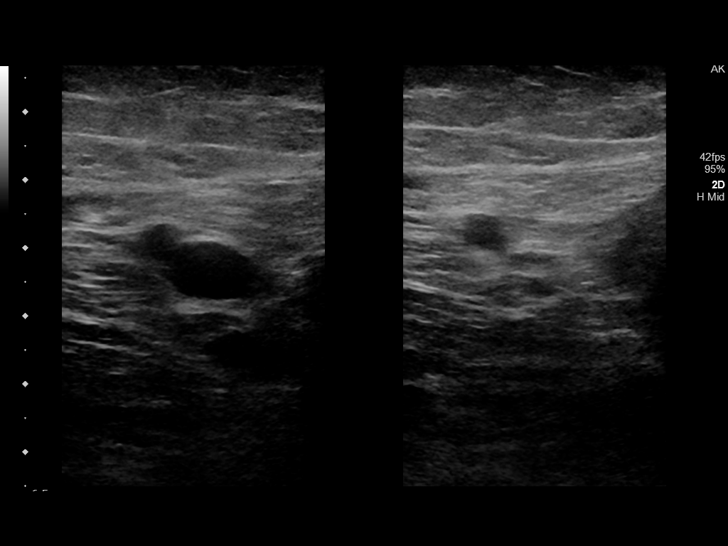
[im 16/37]
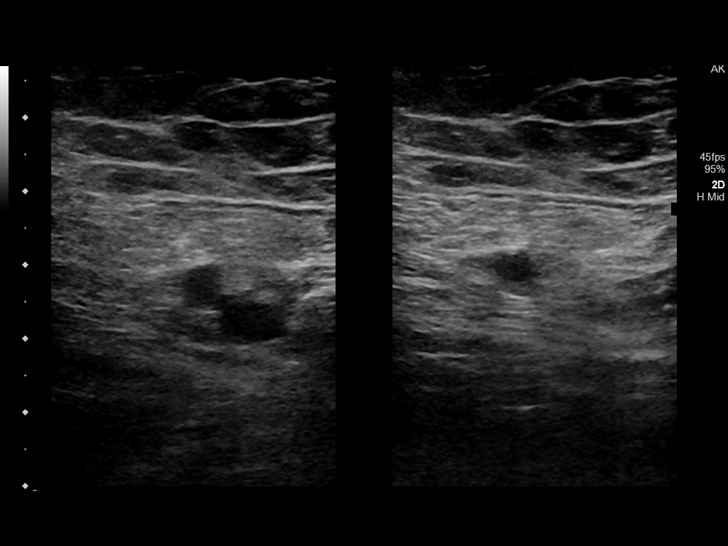
[im 19/37]
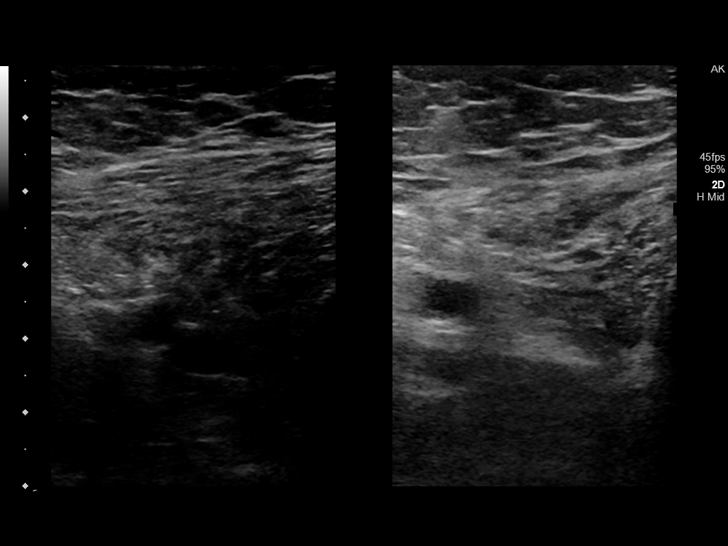
[im 21/37]
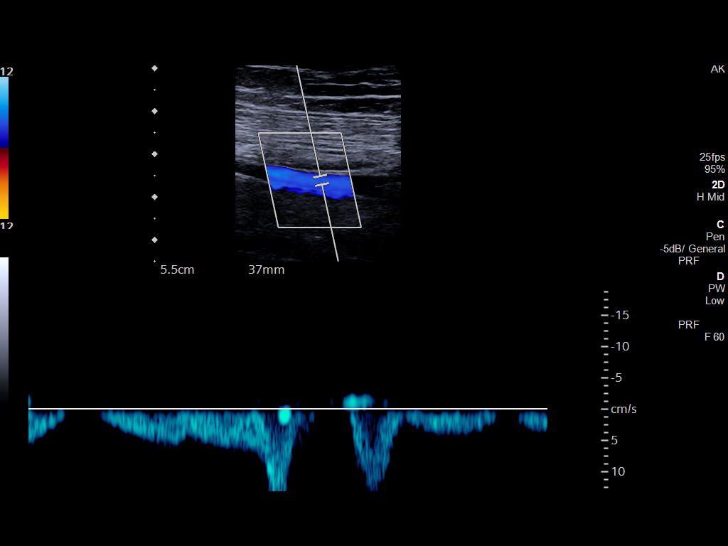
[im 24/37]
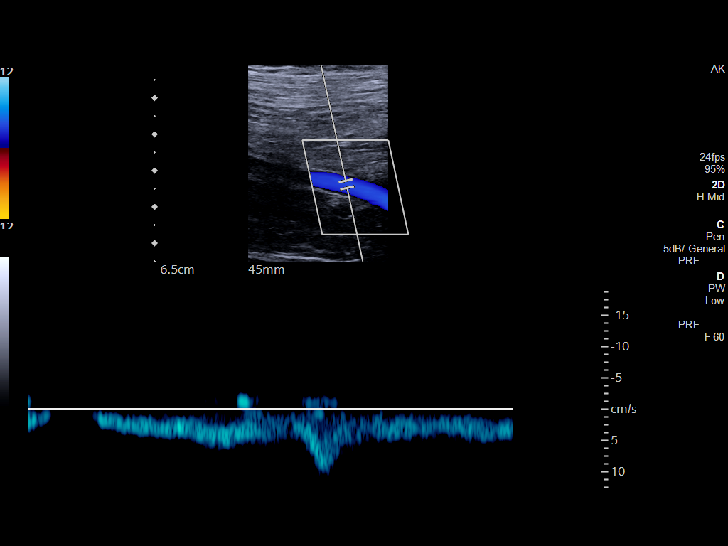
[im 27/37]
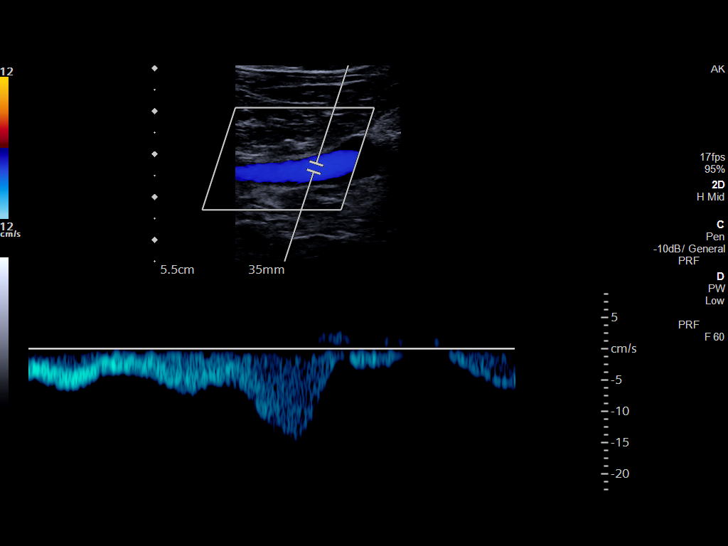
[im 30/37]
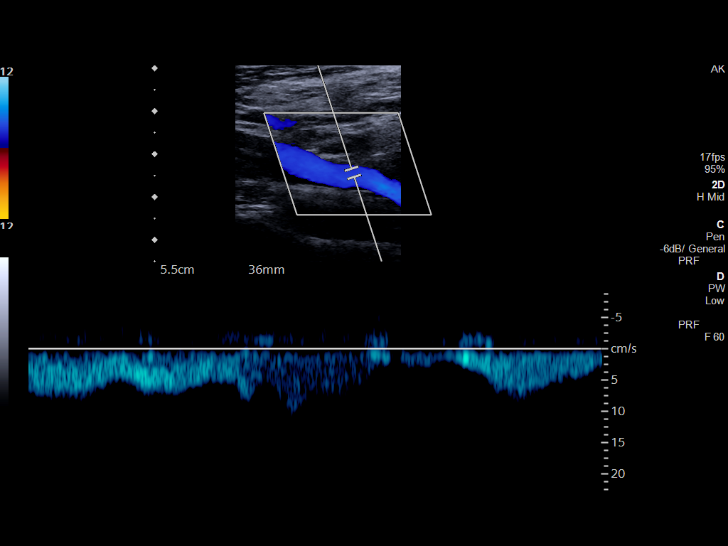
[im 33/37]
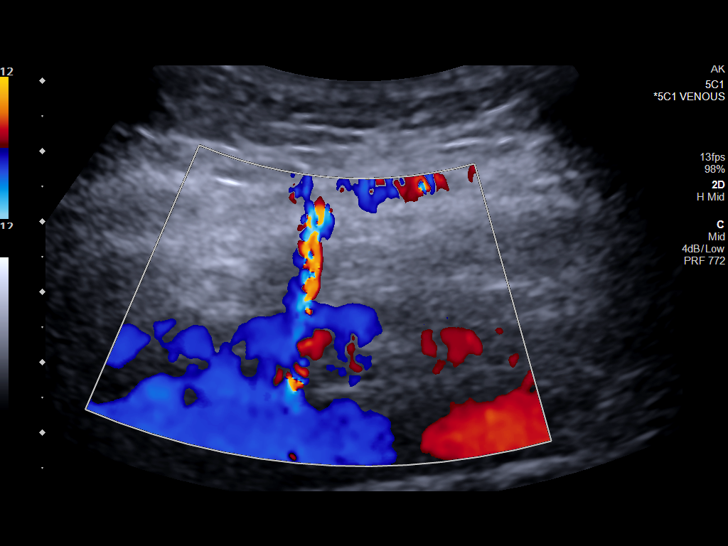
[im 37/37]
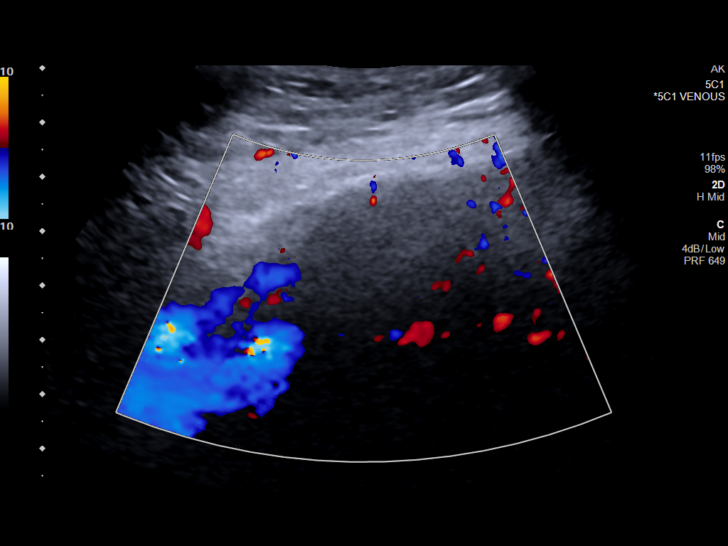

[13 of 24 positions shown; findings below may reference images not displayed]

FINDINGS: Contralateral Common Femoral Vein: Respiratory phasicity is normal
and symmetric with the symptomatic side. No evidence of thrombus.
Normal compressibility.

Common Femoral Vein: No evidence of thrombus. Normal
compressibility, respiratory phasicity and response to augmentation.

Saphenofemoral Junction: No evidence of thrombus. Normal
compressibility and flow on color Doppler imaging.

Profunda Femoral Vein: No evidence of thrombus. Normal
compressibility and flow on color Doppler imaging.

Femoral Vein: No evidence of thrombus. Normal compressibility,
respiratory phasicity and response to augmentation.

Popliteal Vein: No evidence of thrombus. Normal compressibility,
respiratory phasicity and response to augmentation.

Calf Veins: Limited visualization of the calf veins. No gross
thrombus evident.

Superficial Great Saphenous Vein: No evidence of thrombus. Normal
compressibility.

Venous Reflux:  Not assessed

Other Findings:  Peripheral extremity edema noted.
IMPRESSION: No evidence of deep venous thrombosis.

## 2020-04-24 ENCOUNTER — Other Ambulatory Visit: Payer: Self-pay | Admitting: Internal Medicine

## 2020-04-24 DIAGNOSIS — Z1231 Encounter for screening mammogram for malignant neoplasm of breast: Secondary | ICD-10-CM

## 2020-07-18 ENCOUNTER — Other Ambulatory Visit: Payer: Self-pay

## 2020-07-18 ENCOUNTER — Ambulatory Visit (INDEPENDENT_AMBULATORY_CARE_PROVIDER_SITE_OTHER): Payer: Medicare Other | Admitting: Internal Medicine

## 2020-07-18 ENCOUNTER — Encounter: Payer: Self-pay | Admitting: Internal Medicine

## 2020-07-18 VITALS — BP 109/62 | HR 75 | Ht 67.5 in | Wt 170.2 lb

## 2020-07-18 DIAGNOSIS — I2510 Atherosclerotic heart disease of native coronary artery without angina pectoris: Secondary | ICD-10-CM

## 2020-07-18 DIAGNOSIS — R1084 Generalized abdominal pain: Secondary | ICD-10-CM

## 2020-07-18 DIAGNOSIS — I10 Essential (primary) hypertension: Secondary | ICD-10-CM

## 2020-07-18 DIAGNOSIS — E782 Mixed hyperlipidemia: Secondary | ICD-10-CM | POA: Diagnosis not present

## 2020-07-18 NOTE — Progress Notes (Signed)
OFFICE NOTE  Chief Complaint:  Abdominal pain  Primary Care Physician: Leanna Battles, MD  HPI:  Carrye Goller is a pleasant 75 year old female he referred to me by Dr. Sharlett Iles. Her past history significant for well-controlled diabetes, rheumatoid arthritis, dyslipidemia and hypertension. She's also been a long-time smoker. Last year she underwent a CT scan in the setting of unexplained bleeding. This demonstrated coronary artery calcium, particularly in the right coronary artery which was significant. Recently she's been having some chest discomfort. She feels like she has a tightness in the center of her chest. The symptoms do not radiate and are sometimes associated with exertion and relieved by rest. Occasionally she feels like she needs to belch and that does relieve her symptoms. She's also reported some shortness of breath.  Mrs. Nease returns today for follow-up. Her stress test was negative for ischemia. She does have coronary artery calcium as well as calcified atherosclerosis of the distal abdominal aorta and iliac bifurcation. There is disease distending into the iliofemoral arteries bilaterally. She denies any claudication symptoms. She reports since her last visit her chest pain symptoms have improved. She thinks is mainly just been related to stress and he continued workup she had to undergo after her abdominal CT demonstrated a number of abnormalities.  Has a pleasure seeing Mrs. Mcquade back in the office today. When I last saw her she underwent stress testing which was negative for ischemia. I reassured her although did feel that she has coronary disease and that she needed risk modification. She also needed to work on tobacco cessation. Unfortunately several months later she presented with unstable angina and underwent cardiac catheterization. This demonstrated a severe lesion in the right coronary artery for which she received a Stent: Xience Alpine DES 3.5 mm x 38 mm.   since that time, she's had no further chest pain, but she did have some shortness of breath. Recently she saw Cecilie Kicks, FN-P, for hospital follow-up. An echocardiogram was ordered which showed normal systolic function. It was thought that maybe her shortness of breath was related to Brilinta. She was also describing palpitations and wore a monitor which I evaluated and demonstrated no evidence of tachyarrhythmias or extrasystoles. That seems to now have subsided. Fortunately she stop smoking and is been off cigarettes now for 3 months. Finally she is complaining of some acid indigestion, which he thinks may be related to her blood thinners.  At the pleasure seeing Mrs. Vanderbeck back today in the office. She reports that she is doing fairly well although she's had some negative chest discomfort. She said it started last summer and happens infrequently, but seems to be more associated with activity. In addition she has some left arm discomfort. She continues to have a low level of shortness of breath which is improved somewhat. She thinks it might be related to Brilinta. We discussed this previously and she notes that on days when she takes her morning dose of Brilinta later and then takes her normal evening dose, she feels more short of breath. Of note, her coronary intervention was on 10/09/2014, therefore she would require to remain on Brilinta only until 10/09/2015, which is less than one month from now. Today she reported some discomfort in her chest and in EKG shows anterolateral T-wave inversions in lead V2 through V6 which are new compared to her prior EKG. Of note, her previous dyspepsia improved with an H2 blocker but then reoccurred. Her primary care provider put her on a PPI and  this is now resolved her symptoms completely.  10/29/2015  Mrs. Neally returns today for follow-up. She underwent a nuclear stress test which demonstrated no ischemia and a normal LVEF of 70%. She reports her symptoms have  resolved and not recurred. At this point she does not feel that it was a cardiac chest point. I think it is also unlikely based on her recent stent to the fact she was on dual antiplatelet therapy. We did discuss that it's been more than a year since her stent was placed and she is now a candidate for discontinuing Brilinta.  12/01/2016  Mrs. Lamarre was seen in follow-up today. Her only concern is some chronic left leg swelling. 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 She had an episode of chest discomfort around Thanksgiving but she said it resolved after some BC powder. She's not had any recurrence since then. Cholesterol is been well controlled with LDL-C of 64 one year ago. She is due for repeat check today. Blood pressure is well controlled 120/84. EKG which is personally reviewed shows sinus bradycardia and anterolateral T-wave inversions which are unchanged from prior study. She recently stopped methotrexate and plaque were no after her rheumatologist Dr. Charlestine Night retired. She says that there has been some debate about whether or not she actually had rheumatoid arthritis or not she says that the side effects of her medications were more intolerable than any benefit she thought she got from them.  02/03/2018  Mrs. Fracassi was seen today in follow-up.  Overall she seems to be doing well.  She denies any chest pain or worsening shortness of breath.  She occasionally gets flares of her RA.  She is not on any DMARDs at this point because she had significant side effects.  She only uses a prednisone as needed.  She understands that this could lead to irreversible joint destruction however she is without any significant pain at this point and felt that the side effects of her rheumatoid medications were worse than the pain of her RA.  She continues to have some chronic left leg swelling.  I have advised 44mmHg compression stockings to left lower extremity.  This will be  helpful to wear while she is driving.  She did undergo stress test this year for DOT purposes which was negative for ischemia.  She said this may be her last year of driving for work.  Labs reviewed from August 2018 showed total cholesterol 196, HDL 47, LDL 114 and triglycerides 176.  Hemoglobin A1c was elevated 8.  She is not yet at goal and will need a repeat lipid profile.  Her goal LDL is less than 70.  She is on moderate intensity statin and may need to be intensified.  05/04/2019  Mrs. Bramlet returns today for follow-up.  Unfortunately recently she was hospitalized at [facility] for acute sepsis.  This is likely secondary to left leg cellulitis which developed.  She has had problems with swelling in that leg.  This required ICU stay and IV antibiotics but ultimately she recovered.  Besides that she seems to be doing well.  She denies any chest pain or worsening shortness of breath.  Labs from recent office visit show total cholesterol 126, triglycerides 156, HDL 42 and LDL of 53.  EKG shows sinus rhythm.  Blood pressure is excellent today at 110/77.  07/18/2020  Mrs. [place] Desanctis is seen today in follow-up.  Over the past year she has done fairly well.  Today  her only complaints are abdominal pain.  She had recent episodes of vomiting and diarrhea.  She also feels like she gets a burning sensation in her upper mid epigastrium.  Sometimes she feels like food gets stuck to the area and she has had some "projectile" vomiting.  She also has complained of some right upper quadrant tenderness.  She is concerned about whether this could be her gallbladder.  She denies any left-sided chest pain.  EKG today shows sinus rhythm at 75.  Blood pressure is good.  She has had no worsening shortness of breath with exertion.  PMHx:  Past Medical History:  Diagnosis Date  . DM type 2 (diabetes mellitus, type 2) ([place])   . Fatty liver disease, nonalcoholic   . Hypertension   . Migraines    "when I was very young; before  1968"  . Neuromuscular disorder ([place])   . NSTEMI (non-ST elevated myocardial infarction) ([place])   . Rheumatoid arthritis(714.0)   . Transverse myelitis ([place]) 1979   w/transient inability to walk    Past Surgical History:  Procedure Laterality Date  . BREAST LUMPECTOMY  1970   right  . COLONOSCOPY N/A 05/11/2013   Procedure: COLONOSCOPY;  Surgeon: Ladene Artist, MD;  Location: [facility] ENDOSCOPY;  Service: Endoscopy;  Laterality: N/A;  . CORONARY ANGIOPLASTY WITH STENT PLACEMENT  10/09/14   Xience DES to RCA  . FRACTURE SURGERY     plates and screws in right ankle  . LEFT HEART CATHETERIZATION WITH CORONARY ANGIOGRAM N/A 10/09/2014   Procedure: LEFT HEART CATHETERIZATION WITH CORONARY ANGIOGRAM;  Surgeon: Leonie Man, MD;  Location: [facility] CATH LAB;  Service: Cardiovascular;  Laterality: N/A;  . ORIF ANKLE FRACTURE  09/2008   right  . TUBAL LIGATION    . VAGINAL HYSTERECTOMY  1982    FAMHx:  Family History  Problem Relation Age of Onset  . Hypertension Mother   . Cancer Father   . Heart Problems Maternal Grandfather   . Cancer Brother   . Cancer Brother   . Other Brother        GSW  . Stroke Sister        x2  . Hypertension Sister        x2  . COPD Sister     SOCHx:   reports that she quit smoking about 5 years ago. Her smoking use included cigarettes. She has a 7.00 pack-year smoking history. She has never used smokeless tobacco. She reports current alcohol use of about 1.0 standard drink of alcohol per week. She reports current drug use. Drug: Marijuana.  ALLERGIES:  Allergies  Allergen Reactions  . Demerol Other (See Comments)    vomiting  . Peanut-Containing Drug Products     Mouth swelling    ROS: Pertinent items noted in HPI and remainder of comprehensive ROS otherwise negative.  HOME MEDS: Current Outpatient Medications  Medication Sig Dispense Refill  . alendronate (FOSAMAX) 70 MG tablet PLEASE SEE ATTACHED FOR DETAILED DIRECTIONS    . amLODipine (NORVASC)  2.5 MG tablet Take 2.5 mg by mouth daily.    . Dulaglutide (TRULICITY) 1.5 XF/8.1WE SOPN Inject into the skin.    Marland Kitchen losartan (COZAAR) 100 MG tablet Take 100 mg by mouth daily.    . metFORMIN (GLUCOPHAGE-XR) 500 MG 24 hr tablet Take 1,000 mg by mouth 2 (two) times daily.   2  . metoprolol (LOPRESSOR) 100 MG tablet Take 100 mg by mouth 2 (two) times daily.    . nitroGLYCERIN (  NITROSTAT) 0.4 MG SL tablet Place 1 tablet (0.4 mg total) under the tongue every 5 (five) minutes as needed for chest pain. 90 tablet 3  . omeprazole (PRILOSEC) 20 MG capsule Take 20 mg by mouth 2 (two) times daily.  4  . predniSONE (DELTASONE) 10 MG tablet Take 10 mg by mouth as needed (for flare ups).    . rosuvastatin (CRESTOR) 20 MG tablet Take 20 mg by mouth daily.    . traMADol (ULTRAM) 50 MG tablet Take 50 mg by mouth every 6 (six) hours as needed for moderate pain.     . Vitamin D, Ergocalciferol, (DRISDOL) 50000 UNITS CAPS capsule Take 50,000 Units by mouth every 7 (seven) days. Wednesday     No current facility-administered medications for this visit.    LABS/IMAGING: No results found for this or any previous visit (from the past 48 hour(s)). No results found.  VITALS: BP 109/62   Pulse 75   Ht 5' 7.5" (1.715 m)   Wt 170 lb 3.2 oz (77.2 kg)   SpO2 99%   BMI 26.26 kg/m   EXAM: General appearance: alert, appears stated age and no distress Neck: no carotid bruit and no JVD Lungs: clear to auscultation bilaterally Heart: regular rate and rhythm Abdomen: soft, non-tender; bowel sounds normal; no masses,  no organomegaly Extremities: edema Trace left lower extremity edema Pulses: 2+ and symmetric Skin: Skin color, texture, turgor normal. No rashes or lesions Neurologic: Grossly normal Psych: Pleasant  EKG: Normal sinus rhythm at 75-personally reviewed  ASSESSMENT: 1. Chest pain with no ischemic EKG changes - low risk Myoview, EF 70% (2017) 2. CAD - s/p PCI to the RCA with a 3.5 x 38 mm DES  (10/09/2014) 3. Elevated coronary artery calcium score 4. Rheumatoid arthritis 5. Diabetes type 2 6. Dyslipidemia 7. Hypertension 8. Tobacco abuse - now quit x 3 months 9. Peripheral arterial disease 10. GERD 11. Abdominal/upper mid epigastric pain  PLAN: 1.   Mrs. Pech does not seem to have any active cardiac symptoms.  She has been having some upper mid epigastric and abdominal pain with "projectile" vomiting and some diarrhea.  She is concerned about biliary disease.  Her abdomen is diffusely tender but nonfocal.  She says also she feels like food might be getting stuck.  She could have a stricture, pancreatitis, biliary disease or a number of things.  I have encouraged her to follow-up with her PCP on this.  She is also been taking Fosamax as she is on more regular prednisone.  This could cause reflux symptoms as well.  She may want to consider other less frequent options.  Again, she has follow-up with her PCP later this month.  Follow-up with me then annually or sooner as necessary.  Pixie Casino, MD, [facility], [place] Director of the Advanced Lipid Disorders &  Cardiovascular Risk Reduction Clinic Diplomate of the American Board of Clinical Lipidology Attending Cardiologist  Direct Dial: [phone]  Fax: [phone]  Website:  [email or web address] 07/18/2020, 2:04 PM

## 2020-07-18 NOTE — Patient Instructions (Signed)
Medication Instructions:  Your physician recommends that you continue on your current medications as directed. Please refer to the Current Medication list given to you today.  *If you need a refill on your cardiac medications before your next appointment, please call your pharmacy*  Follow-Up: At [facility], you and your health needs are our priority.  As part of our continuing mission to provide you with exceptional heart care, we have created designated Provider Care Teams.  These Care Teams include your primary Cardiologist (physician) and Advanced Practice Providers (APPs -  Physician Assistants and Nurse Practitioners) who all work together to provide you with the care you need, when you need it.  We recommend signing up for the patient portal called "MyChart".  Sign up information is provided on this After Visit Summary.  MyChart is used to connect with patients for Virtual Visits (Telemedicine).  Patients are able to view lab/test results, encounter notes, upcoming appointments, etc.  Non-urgent messages can be sent to your provider as well.   To learn more about what you can do with MyChart, go to [email or web address].    Your next appointment:   12 month(s)  The format for your next appointment:   In Person  Provider:   K. Mali Hilty, MD

## 2020-08-07 ENCOUNTER — Other Ambulatory Visit: Payer: Self-pay | Admitting: Internal Medicine

## 2020-08-07 DIAGNOSIS — R1013 Epigastric pain: Secondary | ICD-10-CM

## 2020-08-08 ENCOUNTER — Ambulatory Visit
Admission: RE | Admit: 2020-08-08 | Discharge: 2020-08-08 | Disposition: A | Discharge status: Home / Self Care | Payer: Medicare Other | Source: Ambulatory Visit | Attending: Internal Medicine | Admitting: Internal Medicine

## 2020-08-08 ENCOUNTER — Other Ambulatory Visit: Payer: Self-pay

## 2020-08-08 DIAGNOSIS — R1013 Epigastric pain: Secondary | ICD-10-CM

## 2021-07-29 IMAGING — US US ABDOMEN LIMITED RUQ/ASCITES
1 series · 14 of 25 positions shown · non-contrast
Comparison: CT 02/23/2019.

CLINICAL DATA: Epigastric pain.

EXAM:
ULTRASOUND ABDOMEN LIMITED RIGHT UPPER QUADRANT

[Series 1: us abdomen limited ruq/ascites · 0.20mm/px · 14 of 56 slices shown]
[im 1/56]
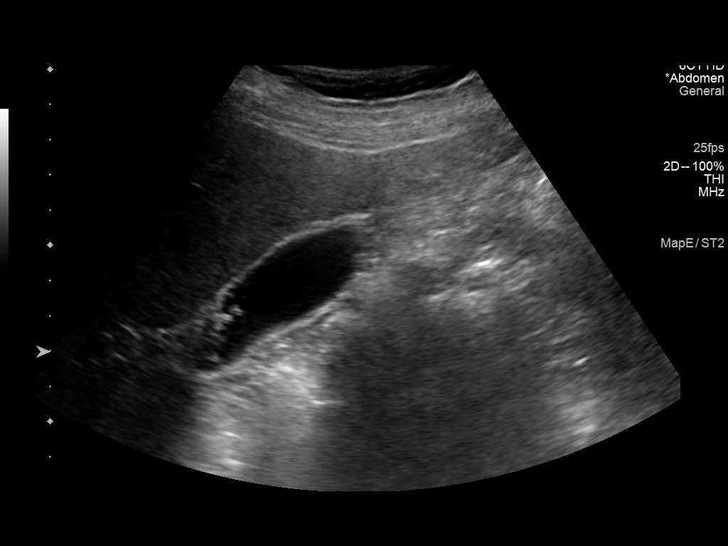
[im 5/56]
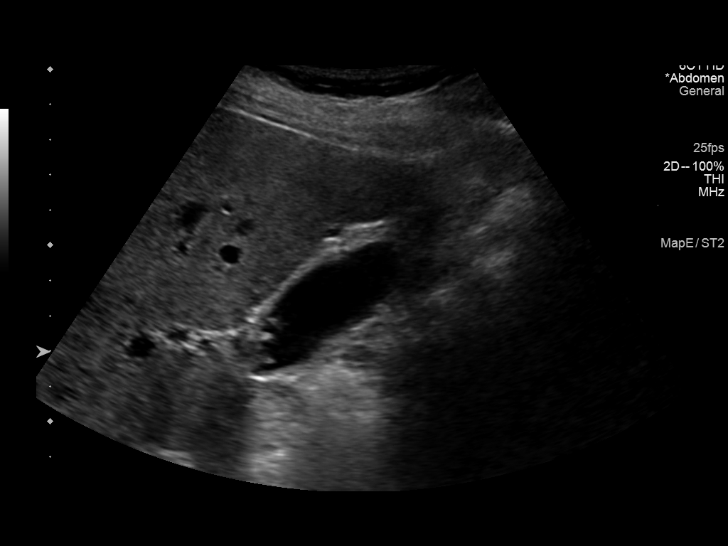
[im 10/56]
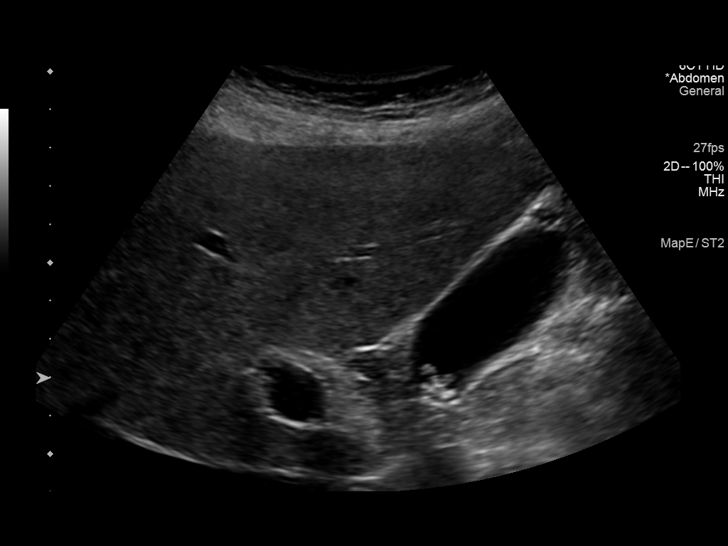
[im 14/56]
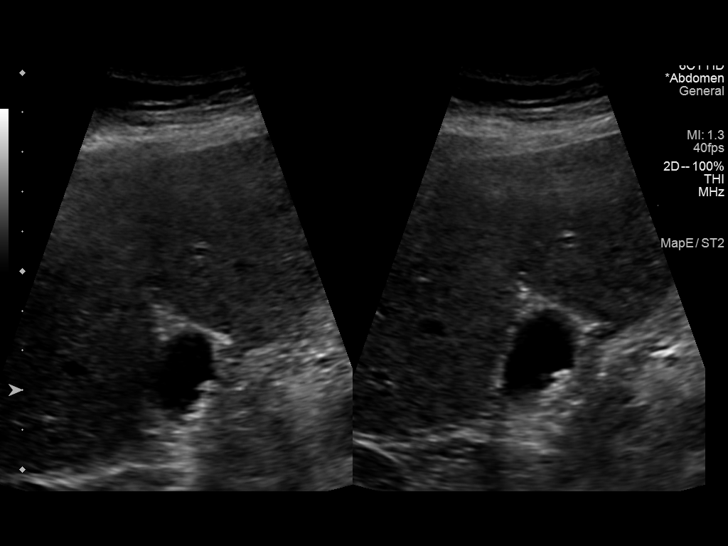
[im 19/56]
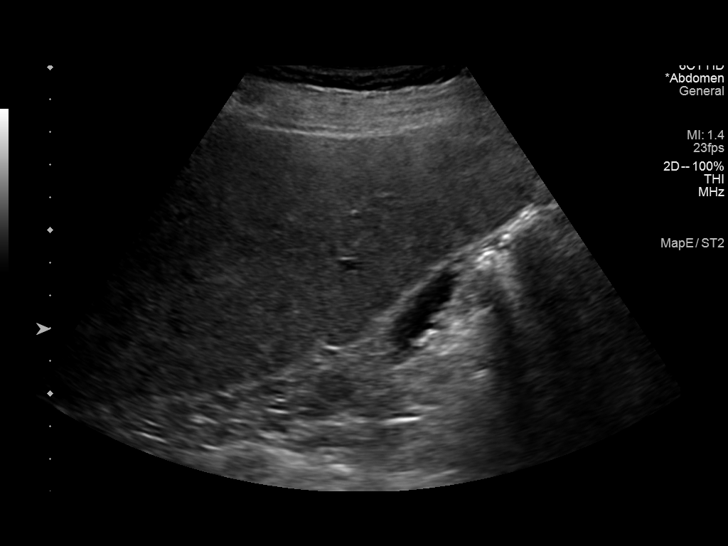
[im 21/56]
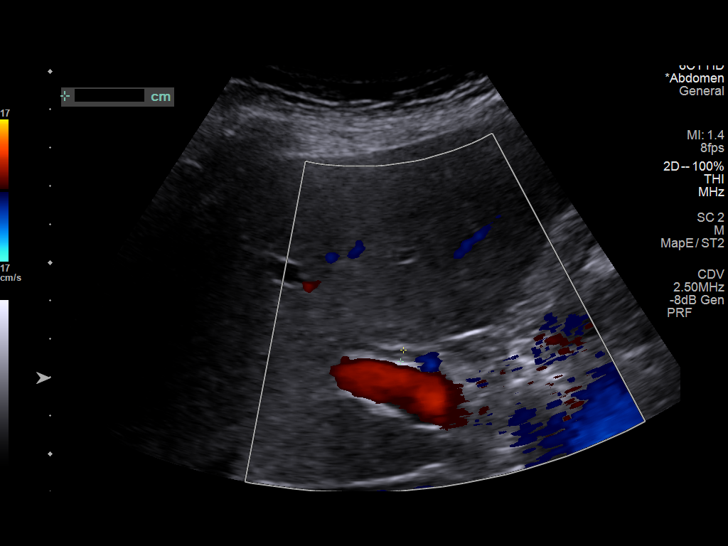
[im 26/56]
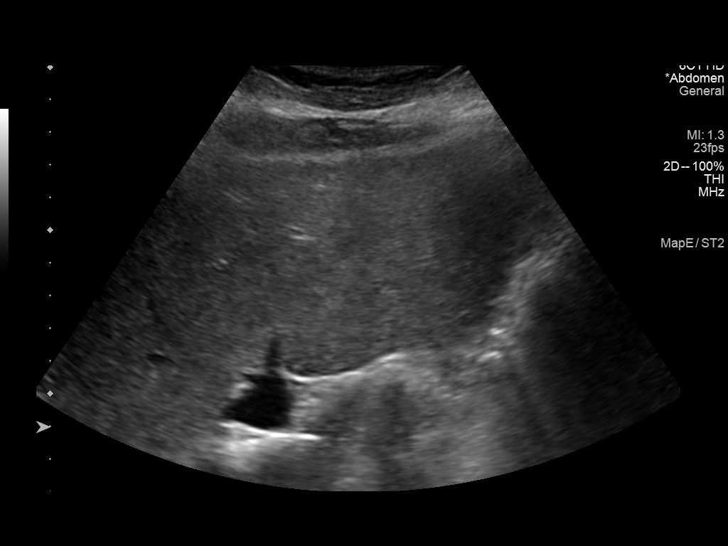
[im 30/56]
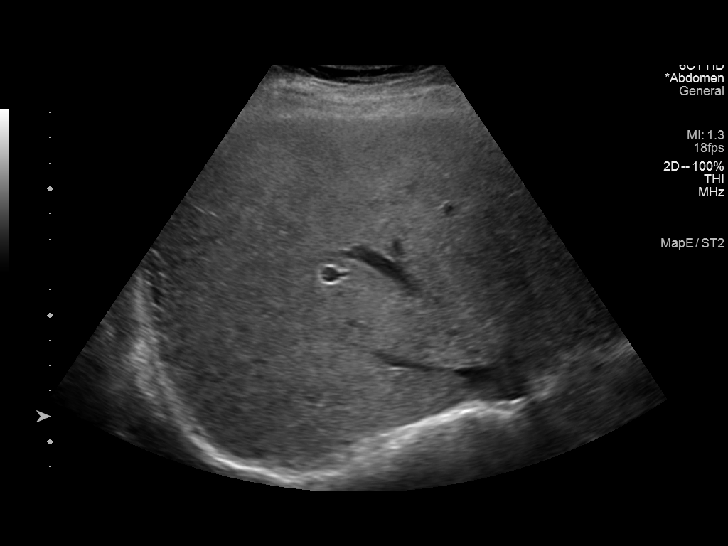
[im 35/56]
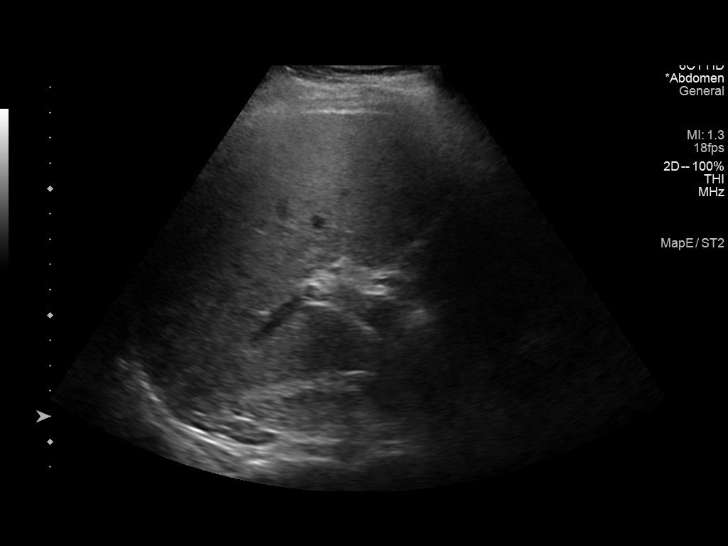
[im 37/56]
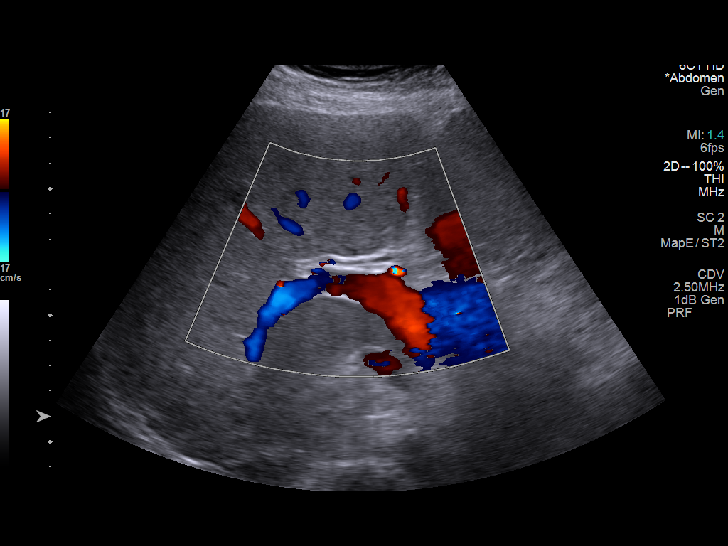
[im 42/56]
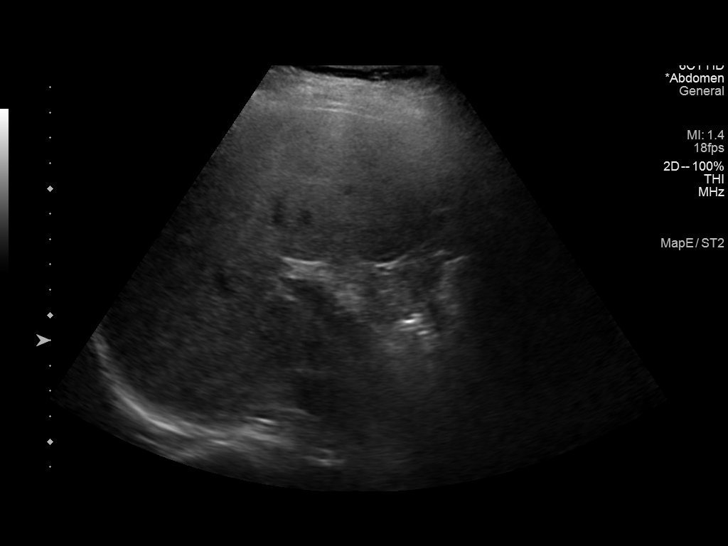
[im 46/56]
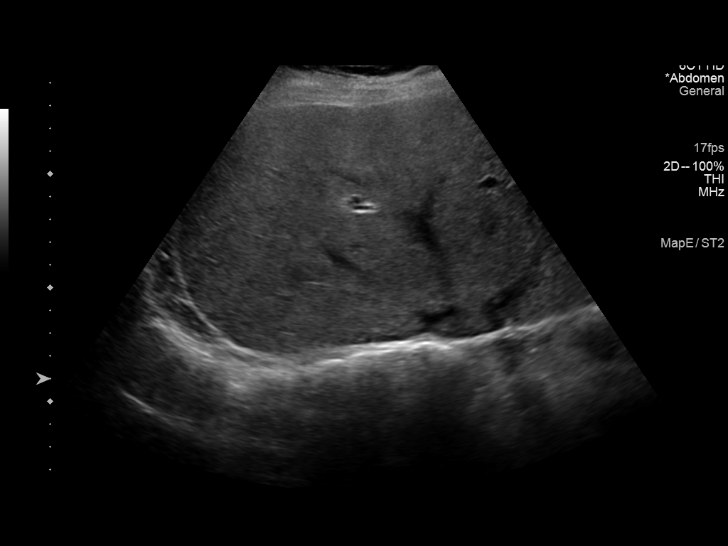
[im 51/56]
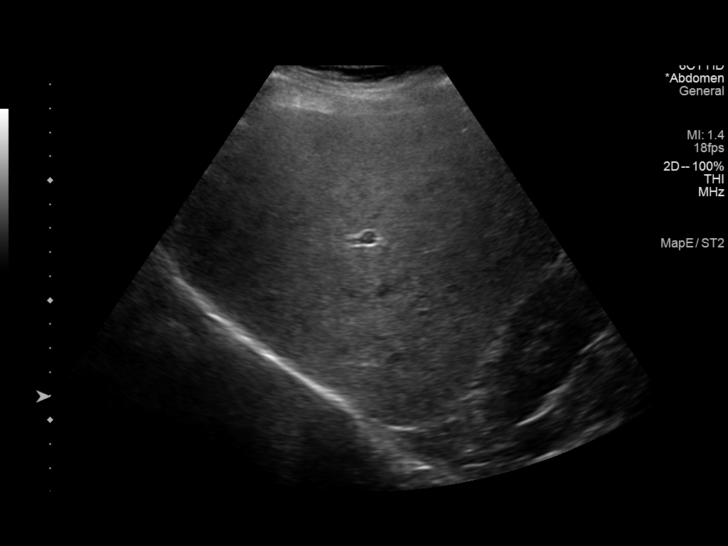
[im 56/56]
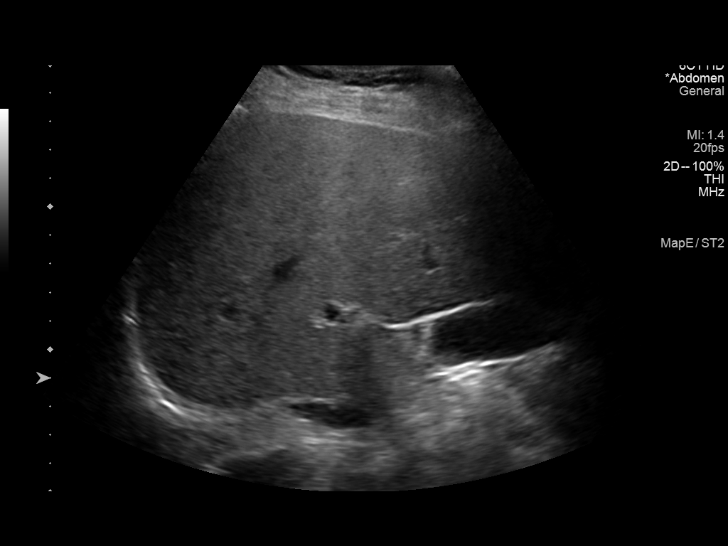

[14 of 25 positions shown; findings below may reference images not displayed]

FINDINGS: Gallbladder:

Multiple gallstones measuring up to 4 mm. Gallbladder wall thickness
normal. No pericholecystic fluid collection.

Common bile duct:

Diameter: 3.1 mm

Liver:

No focal lesion identified. Within normal limits in parenchymal
echogenicity. Portal vein is patent on color Doppler imaging with
normal direction of blood flow towards the liver.

Other: None.
IMPRESSION: Cholelithiasis without evidence of acute cholecystitis.

## 2021-08-22 ENCOUNTER — Ambulatory Visit: Payer: Medicare Other | Admitting: Internal Medicine

## 2021-09-16 NOTE — Progress Notes (Signed)
?Cardiology Office Note:   ? ?Date:  09/19/2021  ? ?ID:  Hayley Hogan, DOB 02/26/1946, MRN [id number] ? ?PCP:  Donnajean Lopes, MD  ?Cardiologist:  Pixie Casino, MD  ?Electrophysiologist:  None  ? ?Referring MD: Donnajean Lopes, MD  ? ?Chief Complaint: follow-up of CAD ? ?History of Present Illness:   ? ?Hayley Hogan is a 75 y.o. female with a history of CAD s/p DES to RCA in 09/2014 with low risk Myoview in 2017 and 2019, hypertension, hyperlipidemia, type 2 diabetes, GERD, fatty liver disease, migraines, rheumatoid arthritis, and prior tobacco abuse who is followed by Dr. Debara Pickett and presents today for routine follow-up. ? ?Patient has known CAD with prior DES to RCA in 42016.  Echo in 10/2014 showed LVEF of 55-60% with normal wall motion and grade 1 diastolic dysfunction.  Last ischemic evaluation was a Myoview in 2019 which was low risk with no evidence of ischemia. ? ?Patient was last seen by Dr. Debara Pickett in 07/2020 at which time she was doing well from a cardiac standpoint was having a lot of GI issues with abdominal pain and recent episodes of "projectile" vomiting and diarrhea.  She was advised to follow-up with her PCP for this. ? ?Patient presents today for routine follow-up. Here alone. Overall doing well.  Her GI issues have resolved.  She notes very mild chest aching under her left breast that occurs randomly a couple times a week both at rest and with exertion but not worse with exertion. Pain lasts for about 1 minute at a time and then resolves on its own. She states this is not new and has been going on for a very long time (years).  It sounds like this is the same pain that she had prior to her last stress test which was normal.  This is stable and has not been any worse.  She denies any shortness of breath, orthopnea, PND, palpitations, lightheadedness, dizziness, syncope.  She has chronic lower extremity edema which is stable (left leg chronically larger than the right).  She also reports  significant night sweats to the point where sweat is dripping off her face and her clothes are soaked as well as intermittent profuse sweating during the day.  This is not new and has been going on for years.  She initially thought it was just menopause but it has not gone away.  He denies any unexplained weight loss or abnormal bleeding.  She also reports intermittent muscle aches and leg cramping especially at night.  She previously came off her statin as she thought this may be the cause but symptoms persisted.   ? ?Past Medical History:  ?Diagnosis Date  ? DM type 2 (diabetes mellitus, type 2) ([place])   ? Fatty liver disease, nonalcoholic   ? Hypertension   ? Migraines   ? "when I was very young; before 1968"  ? Neuromuscular disorder ([place])   ? NSTEMI (non-ST elevated myocardial infarction) ([place])   ? Rheumatoid arthritis(714.0)   ? Transverse myelitis ([place]) 1979  ? w/transient inability to walk  ? ? ?Past Surgical History:  ?Procedure Laterality Date  ? BREAST LUMPECTOMY  1970  ? right  ? COLONOSCOPY N/A 05/11/2013  ? Procedure: COLONOSCOPY;  Surgeon: Ladene Artist, MD;  Location: [facility] ENDOSCOPY;  Service: Endoscopy;  Laterality: N/A;  ? CORONARY ANGIOPLASTY WITH STENT PLACEMENT  10/09/14  ? Xience DES to RCA  ? FRACTURE SURGERY    ? plates and screws in right ankle  ?  LEFT HEART CATHETERIZATION WITH CORONARY ANGIOGRAM N/A 10/09/2014  ? Procedure: LEFT HEART CATHETERIZATION WITH CORONARY ANGIOGRAM;  Surgeon: Leonie Man, MD;  Location: [facility] CATH LAB;  Service: Cardiovascular;  Laterality: N/A;  ? ORIF ANKLE FRACTURE  09/2008  ? right  ? TUBAL LIGATION    ? VAGINAL HYSTERECTOMY  1982  ? ? ?Current Medications: ?Current Meds  ?Medication Sig  ? alendronate (FOSAMAX) 70 MG tablet PLEASE SEE ATTACHED FOR DETAILED DIRECTIONS  ? amLODipine (NORVASC) 2.5 MG tablet Take 2.5 mg by mouth daily.  ? Dulaglutide (TRULICITY) 1.5 BW/4.6KZ SOPN Inject into the skin.  ? losartan (COZAAR) 100 MG tablet Take 100 mg by mouth daily.  ?  metFORMIN (GLUCOPHAGE-XR) 500 MG 24 hr tablet Take 1,000 mg by mouth 2 (two) times daily.   ? metoprolol (LOPRESSOR) 100 MG tablet Take 100 mg by mouth 2 (two) times daily.  ? nitroGLYCERIN (NITROSTAT) 0.4 MG SL tablet Place 1 tablet (0.4 mg total) under the tongue every 5 (five) minutes as needed for chest pain.  ? omeprazole (PRILOSEC) 20 MG capsule Take 20 mg by mouth 2 (two) times daily.  ? predniSONE (DELTASONE) 10 MG tablet Take 10 mg by mouth as needed (for flare ups).  ? rosuvastatin (CRESTOR) 20 MG tablet Take 20 mg by mouth daily.  ? traMADol (ULTRAM) 50 MG tablet Take 50 mg by mouth every 6 (six) hours as needed for moderate pain.   ? Vitamin D, Ergocalciferol, (DRISDOL) 50000 UNITS CAPS capsule Take 50,000 Units by mouth every 7 (seven) days. Wednesday  ?  ? ?Allergies:   Demerol and Peanut-containing drug products  ? ?Social History  ? ?Socioeconomic History  ? Marital status: Married  ?  Spouse name: Not on file  ? Number of children: Not on file  ? Years of education: Not on file  ? Highest education level: Not on file  ?Occupational History  ? Not on file  ?Tobacco Use  ? Smoking status: Former  ?  Packs/day: 0.20  ?  Years: 35.00  ?  Pack years: 7.00  ?  Types: Cigarettes  ?  Quit date: 10/08/2014  ?  Years since quitting: 6.9  ? Smokeless tobacco: Never  ?Substance and Sexual Activity  ? Alcohol use: Yes  ?  Alcohol/week: 1.0 standard drink  ?  Types: 1 Glasses of wine per week  ? Drug use: Yes  ?  Types: Marijuana  ?  Comment: "marijuana in the 1960's"  ? Sexual activity: Never  ?Other Topics Concern  ? Not on file  ?Social History Narrative  ? Not on file  ? ?Social Determinants of Health  ? ?Financial Resource Strain: Not on file  ?Food Insecurity: Not on file  ?Transportation Needs: Not on file  ?Physical Activity: Not on file  ?Stress: Not on file  ?Social Connections: Not on file  ?  ? ?Family History: ?The patient's family history includes COPD in her sister; Cancer in her brother, brother,  and father; Heart Problems in her maternal grandfather; Hypertension in her mother and sister; Other in her brother; Stroke in her sister. ? ?ROS:   ?Please see the history of present illness.    ? ?EKGs/Labs/Other Studies Reviewed:   ? ?The following studies were reviewed today: ? ?Echocardiogram 11/05/2014: ?Study Conclusions: ?- Left ventricle: The cavity size was normal. Systolic function was  ?  normal. The estimated ejection fraction was in the range of 55%  ?  to 60%. Wall motion was normal; there were  no regional wall  ?  motion abnormalities. Doppler parameters are consistent with  ?  abnormal left ventricular relaxation (grade 1 diastolic  ?  dysfunction). ?_______________ ? ?Myoview 12/01/2017: ?The left ventricular ejection fraction is hyperdynamic (>65%). ?Nuclear stress EF: 66%. ?No T wave inversion was noted during stress. ?There was no ST segment deviation noted during stress. ?The study is normal. ?This is a low risk study. ?  ?Normal perfusion. LVEF 66% with normal wall motion. This is a low risk study. ? ?EKG:  EKG  ordered today. EKG personally reviewed and demonstrates normal sinus rhythm, rate 69 bpm, with low voltage QRS and nonspecific T wave flattening.  Normal axis.  Normal PR and QRS intervals.  QTc 409 ms. ? ?Recent Labs: ?No results found for requested labs within last 8760 hours.  ?Recent Lipid Panel ?   ?Component Value Date/Time  ? CHOL 178 02/04/2018 0800  ? CHOL 175 01/09/2015 1101  ? TRIG 176 (H) 02/04/2018 0800  ? TRIG 162 (H) 01/09/2015 1101  ? HDL 50 02/04/2018 0800  ? HDL 58 01/09/2015 1101  ? CHOLHDL 3.6 02/04/2018 0800  ? VLDL 35 02/04/2018 0800  ? [place] 93 02/04/2018 0800  ? [place] 85 01/09/2015 1101  ? ? ?Physical Exam:   ? ?Vital Signs: BP 118/80   Pulse 69   Ht 5' 7.5" (1.715 m)   Wt 168 lb 9.6 oz (76.5 kg)   SpO2 97%   BMI 26.02 kg/m?    ? ?Wt Readings from Last 3 Encounters:  ?09/19/21 168 lb 9.6 oz (76.5 kg)  ?07/18/20 170 lb 3.2 oz (77.2 kg)  ?05/04/19 164 lb  (74.4 kg)  ?  ? ?General: 76 y.o. Caucasian female in no acute distress. ?HEENT: Normocephalic and atraumatic. Sclera clear. ?Neck: Supple. No carotid bruits. No JVD. ?Heart: RRR. Distinct S1 and S2. No murmurs, gallo

## 2021-09-19 ENCOUNTER — Ambulatory Visit: Payer: Medicare Other | Admitting: Student

## 2021-09-19 ENCOUNTER — Encounter: Payer: Self-pay | Admitting: Student

## 2021-09-19 VITALS — BP 118/80 | HR 69 | Ht 67.5 in | Wt 168.6 lb

## 2021-09-19 DIAGNOSIS — E118 Type 2 diabetes mellitus with unspecified complications: Secondary | ICD-10-CM

## 2021-09-19 DIAGNOSIS — R0789 Other chest pain: Secondary | ICD-10-CM

## 2021-09-19 DIAGNOSIS — I2510 Atherosclerotic heart disease of native coronary artery without angina pectoris: Secondary | ICD-10-CM | POA: Diagnosis not present

## 2021-09-19 DIAGNOSIS — R61 Generalized hyperhidrosis: Secondary | ICD-10-CM

## 2021-09-19 DIAGNOSIS — E785 Hyperlipidemia, unspecified: Secondary | ICD-10-CM

## 2021-09-19 DIAGNOSIS — I10 Essential (primary) hypertension: Secondary | ICD-10-CM

## 2021-09-19 NOTE — Patient Instructions (Signed)
Medication Instructions:  ?Your physician recommends that you continue on your current medications as directed. Please refer to the Current Medication list given to you today. ? ?*If you need a refill on your cardiac medications before your next appointment, please call your pharmacy* ? ?Lab Work: ?NONE ordered at this time of appointment  ? ?If you have labs (blood work) drawn today and your tests are completely normal, you will receive your results only by: ?MyChart Message (if you have MyChart) OR ?A paper copy in the mail ?If you have any lab test that is abnormal or we need to change your treatment, we will call you to review the results. ? ?Testing/Procedures: ?NONE ordered at this time of appointment  ? ?Follow-Up: ?At [facility], you and your health needs are our priority.  As part of our continuing mission to provide you with exceptional heart care, we have created designated Provider Care Teams.  These Care Teams include your primary Cardiologist (physician) and Advanced Practice Providers (APPs -  Physician Assistants and Nurse Practitioners) who all work together to provide you with the care you need, when you need it. ? ?Your next appointment:   ?1 year(s) ? ?The format for your next appointment:   ?In Person ? ?Provider:   ?Pixie Casino, MD   ? ?Other Instructions ? ? ?

## 2022-12-14 ENCOUNTER — Ambulatory Visit: Payer: Medicare Other | Attending: Cardiology | Admitting: Internal Medicine

## 2022-12-14 ENCOUNTER — Encounter: Payer: Self-pay | Admitting: Internal Medicine

## 2022-12-14 VITALS — BP 134/82 | HR 68 | Ht 67.0 in | Wt 173.0 lb

## 2022-12-14 DIAGNOSIS — I10 Essential (primary) hypertension: Secondary | ICD-10-CM

## 2022-12-14 DIAGNOSIS — I2510 Atherosclerotic heart disease of native coronary artery without angina pectoris: Secondary | ICD-10-CM | POA: Diagnosis not present

## 2022-12-14 DIAGNOSIS — E118 Type 2 diabetes mellitus with unspecified complications: Secondary | ICD-10-CM | POA: Diagnosis not present

## 2022-12-14 DIAGNOSIS — E785 Hyperlipidemia, unspecified: Secondary | ICD-10-CM

## 2022-12-14 DIAGNOSIS — Z7984 Long term (current) use of oral hypoglycemic drugs: Secondary | ICD-10-CM

## 2022-12-14 NOTE — Patient Instructions (Signed)
Medication Instructions:  NO CHANGES  *If you need a refill on your cardiac medications before your next appointment, please call your pharmacy*   Follow-Up: At [facility], you and your health needs are our priority.  As part of our continuing mission to provide you with exceptional heart care, we have created designated Provider Care Teams.  These Care Teams include your primary Cardiologist (physician) and Advanced Practice Providers (APPs -  Physician Assistants and Nurse Practitioners) who all work together to provide you with the care you need, when you need it.  We recommend signing up for the patient portal called "MyChart".  Sign up information is provided on this After Visit Summary.  MyChart is used to connect with patients for Virtual Visits (Telemedicine).  Patients are able to view lab/test results, encounter notes, upcoming appointments, etc.  Non-urgent messages can be sent to your provider as well.   To learn more about what you can do with MyChart, go to [email or web address].    Your next appointment:   12 months with Dr. Rennis Golden  ** call in December/January for your next appointment

## 2022-12-14 NOTE — Progress Notes (Signed)
OFFICE NOTE  Chief Complaint:  No complaints  Primary Care Physician: Garlan Fillers, MD  HPI:  Hayley Hogan is a pleasant 77 year old female he referred to me by Dr. Jarold Motto. Her past history significant for well-controlled diabetes, rheumatoid arthritis, dyslipidemia and hypertension. She's also been a long-time smoker. Last year she underwent a CT scan in the setting of unexplained bleeding. This demonstrated coronary artery calcium, particularly in the right coronary artery which was significant. Recently she's been having some chest discomfort. She feels like she has a tightness in the center of her chest. The symptoms do not radiate and are sometimes associated with exertion and relieved by rest. Occasionally she feels like she needs to belch and that does relieve her symptoms. She's also reported some shortness of breath.  Hayley Hogan returns today for follow-up. Her stress test was negative for ischemia. She does have coronary artery calcium as well as calcified atherosclerosis of the distal abdominal aorta and iliac bifurcation. There is disease distending into the iliofemoral arteries bilaterally. She denies any claudication symptoms. She reports since her last visit her chest pain symptoms have improved. She thinks is mainly just been related to stress and he continued workup she had to undergo after her abdominal CT demonstrated a number of abnormalities.  Has a pleasure seeing Hayley Hogan back in the office today. When I last saw her she underwent stress testing which was negative for ischemia. I reassured her although did feel that she has coronary disease and that she needed risk modification. She also needed to work on tobacco cessation. Unfortunately several months later she presented with unstable angina and underwent cardiac catheterization. This demonstrated a severe lesion in the right coronary artery for which she received a Stent: Xience Alpine DES 3.5  mm x 38  mm.   since that time, she's had no further chest pain, but she did have some shortness of breath. Recently she saw Hayley Hogan, FN-P, for hospital follow-up. An echocardiogram was ordered which showed normal systolic function. It was thought that maybe her shortness of breath was related to Brilinta. She was also describing palpitations and wore a monitor which I evaluated and demonstrated no evidence of tachyarrhythmias or extrasystoles. That seems to now have subsided. Fortunately she stop smoking and is been off cigarettes now for 3 months. Finally she is complaining of some acid indigestion, which he thinks may be related to her blood thinners.  At the pleasure seeing Hayley Hogan back today in the office. She reports that she is doing fairly well although she's had some negative chest discomfort. She said it started last summer and happens infrequently, but seems to be more associated with activity. In addition she has some left arm discomfort. She continues to have a low level of shortness of breath which is improved somewhat. She thinks it might be related to Brilinta. We discussed this previously and she notes that on days when she takes her morning dose of Brilinta later and then takes her normal evening dose, she feels more short of breath. Of note, her coronary intervention was on 10/09/2014, therefore she would require to remain on Brilinta only until 10/09/2015, which is less than one month from now. Today she reported some discomfort in her chest and in EKG shows anterolateral T-wave inversions in lead V2 through V6 which are new compared to her prior EKG. Of note, her previous dyspepsia improved with an H2 blocker but then reoccurred. Her primary care provider put her  on a PPI and this is now resolved her symptoms completely.  10/29/2015  Hayley Hogan returns today for follow-up. She underwent a nuclear stress test which demonstrated no ischemia and a normal LVEF of 70%. She reports her symptoms have  resolved and not recurred. At this point she does not feel that it was a cardiac chest point. I think it is also unlikely based on her recent stent to the fact she was on dual antiplatelet therapy. We did discuss that it's been more than a year since her stent was placed and she is now a candidate for discontinuing Brilinta.  12/01/2016  Hayley Hogan was seen in follow-up today. Her only concern is some chronic left leg swelling. 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 She had an episode of chest discomfort around Thanksgiving but she said it resolved after some BC powder. She's not had any recurrence since then. Cholesterol is been well controlled with LDL-C of 64 one year ago. She is due for repeat check today. Blood pressure is well controlled 120/84. EKG which is personally reviewed shows sinus bradycardia and anterolateral T-wave inversions which are unchanged from prior study. She recently stopped methotrexate and plaque were no after her rheumatologist Dr. Kellie Simmering retired. She says that there has been some debate about whether or not she actually had rheumatoid arthritis or not she says that the side effects of her medications were more intolerable than any benefit she thought she got from them.  02/03/2018  Hayley Hogan was seen today in follow-up.  Overall she seems to be doing well.  She denies any chest pain or worsening shortness of breath.  She occasionally gets flares of her RA.  She is not on any DMARDs at this point because she had significant side effects.  She only uses a prednisone as needed.  She understands that this could lead to irreversible joint destruction however she is without any significant pain at this point and felt that the side effects of her rheumatoid medications were worse than the pain of her RA.  She continues to have some chronic left leg swelling.  I have advised compression stockings to left lower extremity.  This will be  helpful to wear while she is driving.  She did undergo stress test this year for DOT purposes which was negative for ischemia.  She said this may be her last year of driving for work.  Labs reviewed from August 2018 showed total cholesterol 196, HDL 47, LDL 114 and triglycerides 782.  Hemoglobin A1c was elevated 8.  She is not yet at goal and will need a repeat lipid profile.  Her goal LDL is less than 70.  She is on moderate intensity statin and may need to be intensified.  05/04/2019  Mrs. Kizewski returns today for follow-up.  Unfortunately recently she was hospitalized at [facility] for acute sepsis.  This is likely secondary to left leg cellulitis which developed.  She has had problems with swelling in that leg.  This required ICU stay and IV antibiotics but ultimately she recovered.  Besides that she seems to be doing well.  She denies any chest pain or worsening shortness of breath.  Labs from recent office visit show total cholesterol 126, triglycerides 156, HDL 42 and LDL of 53.  EKG shows sinus rhythm.  Blood pressure is excellent today at 110/77.  07/18/2020  Mrs. Almeyda is seen today in follow-up.  Over the past year she has done  fairly well.  Today her only complaints are abdominal pain.  She had recent episodes of vomiting and diarrhea.  She also feels like she gets a burning sensation in her upper mid epigastrium.  Sometimes she feels like food gets stuck to the area and she has had some "projectile" vomiting.  She also has complained of some right upper quadrant tenderness.  She is concerned about whether this could be her gallbladder.  She denies any left-sided chest pain.  EKG today shows sinus rhythm at 75.  Blood pressure is good.  She has had no worsening shortness of breath with exertion.  12/14/2022  Mrs. Webber is seen today for follow-up. She saw Callie last year and was having some atypical sounding chest pain. She still has that. Not worse with exertion - worse when laying down. Very  infrequent shortness of breath. She recently saw her PCP. A1c was higher at 8.9% - she has been off the diet somewhat. Blood pressure has been well-controlled. LDL remains around 70.  PMHx:  Past Medical History:  Diagnosis Date   DM type 2 (diabetes mellitus, type 2) (HCC)    Fatty liver disease, nonalcoholic    Hypertension    Migraines    "when I was very young; before 1968"   Neuromuscular disorder (HCC)    NSTEMI (non-ST elevated myocardial infarction) (HCC)    Rheumatoid arthritis(714.0)    Transverse myelitis (HCC) 1979   w/transient inability to walk    Past Surgical History:  Procedure Laterality Date   BREAST LUMPECTOMY  1970   right   COLONOSCOPY N/A 05/11/2013   Procedure: COLONOSCOPY;  Surgeon: Meryl Dare, MD;  Location: [facility] ENDOSCOPY;  Service: Endoscopy;  Laterality: N/A;   CORONARY ANGIOPLASTY WITH STENT PLACEMENT  10/09/14   Xience DES to RCA   FRACTURE SURGERY     plates and screws in right ankle   LEFT HEART CATHETERIZATION WITH CORONARY ANGIOGRAM N/A 10/09/2014   Procedure: LEFT HEART CATHETERIZATION WITH CORONARY ANGIOGRAM;  Surgeon: Marykay Lex, MD;  Location: [facility] CATH LAB;  Service: Cardiovascular;  Laterality: N/A;   ORIF ANKLE FRACTURE  09/2008   right   TUBAL LIGATION     VAGINAL HYSTERECTOMY  1982    FAMHx:  Family History  Problem Relation Age of Onset   Hypertension Mother    Cancer Father    Heart Problems Maternal Grandfather    Cancer Brother    Cancer Brother    Other Brother        GSW   Stroke Sister        x2   Hypertension Sister        x2   COPD Sister     SOCHx:   reports that she quit smoking about 8 years ago. Her smoking use included cigarettes. She has a 7.00 pack-year smoking history. She has never used smokeless tobacco. She reports current alcohol use of about 1.0 standard drink of alcohol per week. She reports current drug use. Drug: Marijuana.  ALLERGIES:  Allergies  Allergen Reactions   Demerol Other (See  Comments)    vomiting   Peanut-Containing Drug Products     Mouth swelling    ROS: Pertinent items noted in HPI and remainder of comprehensive ROS otherwise negative.  HOME MEDS: Current Outpatient Medications  Medication Sig Dispense Refill   alendronate (FOSAMAX) 70 MG tablet Take 70 mg by mouth once a week.     amLODipine (NORVASC) 2.5 MG tablet Take 2.5 mg by  mouth daily.     Dulaglutide (TRULICITY) 1.5 MG/0.5ML SOPN Inject into the skin once a week.     losartan (COZAAR) 100 MG tablet Take 100 mg by mouth daily.     metFORMIN (GLUCOPHAGE-XR) 500 MG 24 hr tablet Take 1,000 mg by mouth 2 (two) times daily.   2   metoprolol (LOPRESSOR) 100 MG tablet Take 100 mg by mouth 2 (two) times daily.     nitroGLYCERIN (NITROSTAT) 0.4 MG SL tablet Place 1 tablet (0.4 mg total) under the tongue every 5 (five) minutes as needed for chest pain. 90 tablet 3   olmesartan (BENICAR) 40 MG tablet Take 40 mg by mouth daily.     omeprazole (PRILOSEC) 20 MG capsule Take 20 mg by mouth 2 (two) times daily.  4   predniSONE (DELTASONE) 5 MG tablet Take 5 mg by mouth daily with breakfast.     rosuvastatin (CRESTOR) 20 MG tablet Take 20 mg by mouth daily.     Vitamin D, Ergocalciferol, (DRISDOL) 50000 UNITS CAPS capsule Take 50,000 Units by mouth every 7 (seven) days. Wednesday     traMADol (ULTRAM) 50 MG tablet Take 50 mg by mouth every 6 (six) hours as needed for moderate pain.  (Patient not taking: Reported on 12/14/2022)     No current facility-administered medications for this visit.    LABS/IMAGING: No results found for this or any previous visit (from the past 48 hour(s)). No results found.  VITALS: BP 134/82   Pulse 68   Ht 5\' 7"  (1.702 m)   Wt 173 lb (78.5 kg)   SpO2 98%   BMI 27.10 kg/m   EXAM: General appearance: alert, appears stated age, and no distress Neck: no carotid bruit and no JVD Lungs: clear to auscultation bilaterally Heart: regular rate and rhythm Abdomen: soft, non-tender;  bowel sounds normal; no masses,  no organomegaly Extremities: extremities normal, atraumatic, no cyanosis or edema and varicose veins noted Pulses: 2+ and symmetric Skin: Skin color, texture, turgor normal. No rashes or lesions Neurologic: Grossly normal Psych: Pleasant  EKG: EKG Interpretation Date/Time:  Monday December 14 2022 08:38:19 EDT Ventricular Rate:  69 PR Interval:  148 QRS Duration:  78 QT Interval:  406 QTC Calculation: 435 R Axis:   24  Text Interpretation: Normal sinus rhythm Low voltage QRS When compared with ECG of 10-Oct-2014 03:46, No significant change was found Confirmed by Zoila Shutter [id number]) on 12/14/2022 9:06:29 AM    ASSESSMENT: Chest pain with no ischemic EKG changes - low risk Myoview, EF 70% (2017) CAD - s/p PCI to the RCA with a 3.5 x 38 mm DES (10/09/2014) Elevated coronary artery calcium score Rheumatoid arthritis Diabetes type 2 Dyslipidemia Hypertension Tobacco abuse - now quit x 3 months Peripheral arterial disease GERD Abdominal/upper mid epigastric pain  PLAN: 1.   Mrs. Heagney seems to be doing well with a atypical sounding chest pain is worse laying down and not worse with exertion.  She gets infrequent shortness of breath.  She does have some reflux symptoms.  Her LDL is around 70 which is well treated.  Blood pressure is well-controlled.  She does get some hot flash type symptoms but is different than her postmenopausal symptoms.  I suspect this is some vasomotor instability.  She tried to stop amlodipine for about 6 weeks with no difference in her symptoms but her blood pressure was less well-controlled.  She is now back on that.  A1c has crept up somewhat.  She will follow-up  with her PCP regarding this.  Plan follow-up with me annually or sooner as necessary.  Chrystie Nose, MD, [facility], FACP  [place]  [facility]  Medical Director of the [facility]  [facility]ovascular [facility] Reduction [facility] Diplomate of the American  Board of Clinical Lipidology Attending Cardiologist  Direct Dial: [phone]  Fax: [phone]  Website:  [email or web address] Humphries 12/14/2022, 9:06 AM

## 2023-04-19 ENCOUNTER — Ambulatory Visit
Admission: EM | Admit: 2023-04-19 | Discharge: 2023-04-19 | Disposition: A | Discharge status: Home / Self Care | Payer: Medicare Other | Attending: Emergency Medicine | Admitting: Emergency Medicine

## 2023-04-19 DIAGNOSIS — L03113 Cellulitis of right upper limb: Secondary | ICD-10-CM | POA: Diagnosis not present

## 2023-04-19 MED ORDER — CEPHALEXIN 500 MG PO CAPS: 500.0000 mg | ORAL_CAPSULE | Freq: Three times a day (TID) | ORAL | 0 refills | Status: AC

## 2023-04-19 MED ORDER — PREDNISONE 20 MG PO TABS: 40.0000 mg | ORAL_TABLET | Freq: Every day | ORAL | 0 refills | Status: DC

## 2023-04-19 NOTE — ED Triage Notes (Signed)
Patient presents to UC for right elbow pain x 1 month. States she bursitis and concerned with infection. Not taking any OTC meds.

## 2023-04-19 NOTE — Discharge Instructions (Signed)
Today you are evaluated for your bursitis, redness and tenderness is consistent with infection therefore you have been started on antibiotic  Take cephalexin every 8 hours for 5 days to clear any germ  Begin prednisone 40 mg daily for 5 days in an effort to help reduce swelling is much as possible, temporarily stop your daily dose of prednisone  Use heat or ice over the affected area as needed  May elevate arm on pillows whenever sitting or lying as needed to help reduce swelling  For drainage of the bursa, typically needs to be completed by orthopedic doctor, on front page is information to [facility] who has walk-in urgent care

## 2023-04-19 NOTE — ED Provider Notes (Signed)
Renaldo Fiddler    CSN: [id number] Arrival date & time: 04/19/23  7829      History   Chief Complaint Chief Complaint  Patient presents with   Elbow Pain    HPI Hayley Hogan is a 77 y.o. female.   Patient presents for evaluation of swelling to the right elbow present for 1 month.  Over the past week has been noted to have pain only when site is palpated, mild tenderness rated a 0.5 out of 10 the border and erythema.  Full range of motion.  Denies numbness or tingling.  Concerned with infection.  Known history of bursitis.     Past Medical History:  Diagnosis Date   DM type 2 (diabetes mellitus, type 2) (HCC)    Fatty liver disease, nonalcoholic    Hypertension    Migraines    "when I was very young; before 1968"   Neuromuscular disorder (HCC)    NSTEMI (non-ST elevated myocardial infarction) (HCC)    Rheumatoid arthritis(714.0)    Transverse myelitis (HCC) 1979   w/transient inability to walk    Patient Active Problem List   Diagnosis Date Noted   Sepsis (HCC) 02/23/2019   CAD in native artery 02/03/2018   Mixed hyperlipidemia 12/01/2016   GERD (gastroesophageal reflux disease) 12/19/2014   Stented coronary artery    Pain in the chest    Acute coronary syndrome [facility])    PAD (peripheral artery disease) (HCC) 04/27/2014   Chest pain 03/15/2014   Elevated coronary artery calcium score 03/15/2014   Lung nodule 05/11/2013   Osteopenia 05/11/2013   Atherosclerosis of aorta (HCC) 05/11/2013   Hepatic cirrhosis (HCC) 05/11/2013   Thrombocytopenia, unspecified (HCC) 05/11/2013   Benign neoplasm of colon 05/11/2013   Lower GI bleed 05/09/2013   DM2 (diabetes mellitus, type 2) (HCC) 05/09/2013   Essential hypertension, benign 05/09/2013   Acute lower GI bleeding 05/09/2013   Chronic cholecystitis 02/18/2012   Rheumatoid arthritis (HCC) 01/07/2012   Headache 10/01/2011    Class: Acute   Nausea and vomiting 10/01/2011    Class: Acute   Hypertension 10/01/2011     Class: Acute    Past Surgical History:  Procedure Laterality Date   BREAST LUMPECTOMY  1970   right   COLONOSCOPY N/A 05/11/2013   Procedure: COLONOSCOPY;  Surgeon: Meryl Dare, MD;  Location: [facility] ENDOSCOPY;  Service: Endoscopy;  Laterality: N/A;   CORONARY ANGIOPLASTY WITH STENT PLACEMENT  10/09/14   Xience DES to RCA   FRACTURE SURGERY     plates and screws in right ankle   LEFT HEART CATHETERIZATION WITH CORONARY ANGIOGRAM N/A 10/09/2014   Procedure: LEFT HEART CATHETERIZATION WITH CORONARY ANGIOGRAM;  Surgeon: Marykay Lex, MD;  Location: [facility] CATH LAB;  Service: Cardiovascular;  Laterality: N/A;   ORIF ANKLE FRACTURE  09/2008   right   TUBAL LIGATION     VAGINAL HYSTERECTOMY  1982    OB History   No obstetric history on file.      Home Medications    Prior to Admission medications   Medication Sig Start Date End Date Taking? Authorizing Provider  cephALEXin (KEFLEX) 500 MG capsule Take 1 capsule (500 mg total) by mouth 3 (three) times daily for 5 days. 04/19/23 04/24/23 Yes Deonne Rooks R, NP  predniSONE (DELTASONE) 20 MG tablet Take 2 tablets (40 mg total) by mouth daily. 04/19/23  Yes Hiromi Knodel, Elita Boone, NP  alendronate (FOSAMAX) 70 MG tablet Take 70 mg by mouth once a week. 07/02/20  [provider]  amLODipine (NORVASC) 2.5 MG tablet Take 2.5 mg by mouth daily.    [provider]  Dulaglutide (TRULICITY) 1.5 MG/0.5ML SOPN Inject into the skin once a week.    [provider]  losartan (COZAAR) 100 MG tablet Take 100 mg by mouth daily.    [provider]  metFORMIN (GLUCOPHAGE-XR) 500 MG 24 hr tablet Take 1,000 mg by mouth 2 (two) times daily.  11/26/14   [provider]  metoprolol (LOPRESSOR) 100 MG tablet Take 100 mg by mouth 2 (two) times daily.    [provider]  nitroGLYCERIN (NITROSTAT) 0.4 MG SL tablet Place 1 tablet (0.4 mg total) under the tongue every 5 (five) minutes as needed for chest pain. 10/30/14    Leone Brand, NP  olmesartan (BENICAR) 40 MG tablet Take 40 mg by mouth daily. 11/13/22   [provider]  omeprazole (PRILOSEC) 20 MG capsule Take 20 mg by mouth 2 (two) times daily. 08/28/15   [provider]  predniSONE (DELTASONE) 5 MG tablet Take 5 mg by mouth daily with breakfast.    [provider]  rosuvastatin (CRESTOR) 20 MG tablet Take 20 mg by mouth daily. 04/21/20   [provider]  traMADol (ULTRAM) 50 MG tablet Take 50 mg by mouth every 6 (six) hours as needed for moderate pain.  Patient not taking: Reported on 12/14/2022    [provider]  Vitamin D, Ergocalciferol, (DRISDOL) 50000 UNITS CAPS capsule Take 50,000 Units by mouth every 7 (seven) days. Wednesday    [provider]    Family History Family History  Problem Relation Age of Onset   Hypertension Mother    Cancer Father    Heart Problems Maternal Grandfather    Cancer Brother    Cancer Brother    Other Brother        GSW   Stroke Sister        x2   Hypertension Sister        x2   COPD Sister     Social History Social History   Tobacco Use   Smoking status: Former    Current packs/day: 0.00    Average packs/day: 0.2 packs/day for 35.0 years (7.0 ttl pk-yrs)    Types: Cigarettes    Start date: 10/08/1979    Quit date: 10/08/2014    Years since quitting: 8.5   Smokeless tobacco: Never  Substance Use Topics   Alcohol use: Yes    Alcohol/week: 1.0 standard drink of alcohol    Types: 1 Glasses of wine per week   Drug use: Yes    Types: Marijuana    Comment: "marijuana in the 1960's"     Allergies   Demerol and Peanut-containing drug products   Review of Systems Review of Systems   Physical Exam Triage Vital Signs ED Triage Vitals  Encounter Vitals Group     BP 04/19/23 0941 (!) 149/93     Systolic BP Percentile --      Diastolic BP Percentile --      Pulse Rate 04/19/23 0941 79     Resp 04/19/23 0941 16     Temp 04/19/23 0941 97.8 F  (36.6 C)     Temp Source 04/19/23 0941 Temporal     SpO2 04/19/23 0941 96 %     Weight --      Height --      Head Circumference --      Peak Flow --  Pain Score 04/19/23 0940 0     Pain Loc --      Pain Education --      Exclude from Growth Chart --    No data found.  Updated Vital Signs BP (!) 149/93 (BP Location: Left Arm)   Pulse 79   Temp 97.8 F (36.6 C) (Temporal)   Resp 16   SpO2 96%   Visual Acuity Right Eye Distance:   Left Eye Distance:   Bilateral Distance:    Right Eye Near:   Left Eye Near:    Bilateral Near:     Physical Exam Constitutional:      Appearance: Normal appearance.  Eyes:     Extraocular Movements: Extraocular movements intact.  Pulmonary:     Effort: Pulmonary effort is normal.  Musculoskeletal:     Comments: Moderate to severe swelling over the right epicondyle, erythematous and skin is warm to touch, mildly tender, 2+ brachial pulse, has full range of motion of the upper extremity  Neurological:     Mental Status: She is alert and oriented to person, place, and time. Mental status is at baseline.      UC Treatments / Results  Labs (all labs ordered are listed, but only abnormal results are displayed) Labs Reviewed - No data to display  EKG   Radiology No results found.  Procedures Procedures (including critical care time)  Medications Ordered in UC Medications - No data to display  Initial Impression / Assessment and Plan / UC Course  I have reviewed the triage vital signs and the nursing notes.  Pertinent labs & imaging results that were available during my care of the patient were reviewed by me and considered in my medical decision making (see chart for details).  Cellulitis of right elbow  New erythema, tenderness and warmth to the skin is consistent with infection, discussed this with patient, prescribed cephalexin for treatment, on exam there is significant swelling present, unable to drain in this clinic,  discussed with patient, prescribed prednisone 40 mg burst recommended heat and ice and elevation for additional supportive care, swelling continues to persist she is to follow-up with orthopedics for drainage, walking referral given Final Clinical Impressions(s) / UC Diagnoses   Final diagnoses:  Cellulitis of right elbow     Discharge Instructions      Today you are evaluated for your bursitis, redness and tenderness is consistent with infection therefore you have been started on antibiotic  Take cephalexin every 8 hours for 5 days to clear any germ  Begin prednisone 40 mg daily for 5 days in an effort to help reduce swelling is much as possible, temporarily stop your daily dose of prednisone  Use heat or ice over the affected area as needed  May elevate arm on pillows whenever sitting or lying as needed to help reduce swelling  For drainage of the bursa, typically needs to be completed by orthopedic doctor, on front page is information to [facility] who has walk-in urgent care   ED Prescriptions     Medication Sig Dispense Auth. Provider   cephALEXin (KEFLEX) 500 MG capsule Take 1 capsule (500 mg total) by mouth 3 (three) times daily for 5 days. 15 capsule Girlie Veltri R, NP   predniSONE (DELTASONE) 20 MG tablet Take 2 tablets (40 mg total) by mouth daily. 10 tablet Valinda Hoar, NP      PDMP not reviewed this encounter.   Valinda Hoar, NP 04/19/23 1009

## 2023-08-02 ENCOUNTER — Other Ambulatory Visit: Payer: Self-pay

## 2023-08-02 ENCOUNTER — Observation Stay
Admission: EM | Admit: 2023-08-02 | Discharge: 2023-08-03 | Disposition: A | Discharge status: Home / Self Care | Payer: Medicare Other | Attending: Internal Medicine | Admitting: Internal Medicine

## 2023-08-02 DIAGNOSIS — I739 Peripheral vascular disease, unspecified: Secondary | ICD-10-CM | POA: Insufficient documentation

## 2023-08-02 DIAGNOSIS — I2510 Atherosclerotic heart disease of native coronary artery without angina pectoris: Secondary | ICD-10-CM | POA: Diagnosis not present

## 2023-08-02 DIAGNOSIS — I10 Essential (primary) hypertension: Secondary | ICD-10-CM | POA: Insufficient documentation

## 2023-08-02 DIAGNOSIS — Z955 Presence of coronary angioplasty implant and graft: Secondary | ICD-10-CM | POA: Insufficient documentation

## 2023-08-02 DIAGNOSIS — Z87891 Personal history of nicotine dependence: Secondary | ICD-10-CM | POA: Insufficient documentation

## 2023-08-02 DIAGNOSIS — E1165 Type 2 diabetes mellitus with hyperglycemia: Secondary | ICD-10-CM | POA: Diagnosis not present

## 2023-08-02 DIAGNOSIS — T782XXA Anaphylactic shock, unspecified, initial encounter: Secondary | ICD-10-CM | POA: Diagnosis present

## 2023-08-02 DIAGNOSIS — Z79899 Other long term (current) drug therapy: Secondary | ICD-10-CM | POA: Insufficient documentation

## 2023-08-02 DIAGNOSIS — Z91018 Allergy to other foods: Secondary | ICD-10-CM | POA: Insufficient documentation

## 2023-08-02 DIAGNOSIS — Z7984 Long term (current) use of oral hypoglycemic drugs: Secondary | ICD-10-CM | POA: Diagnosis not present

## 2023-08-02 DIAGNOSIS — M069 Rheumatoid arthritis, unspecified: Secondary | ICD-10-CM | POA: Insufficient documentation

## 2023-08-02 MED ORDER — METHYLPREDNISOLONE SODIUM SUCC 125 MG IJ SOLR
125.0000 mg | Freq: Once | INTRAMUSCULAR | Status: AC
  Administered 2023-08-02: 125 mg via INTRAVENOUS
  Filled 2023-08-02: qty 2

## 2023-08-02 MED ORDER — FAMOTIDINE IN NACL 20-0.9 MG/50ML-% IV SOLN
20.0000 mg | Freq: Once | INTRAVENOUS | Status: AC
  Administered 2023-08-02: 20 mg via INTRAVENOUS
  Filled 2023-08-02: qty 50

## 2023-08-02 NOTE — ED Provider Notes (Signed)
 [facility] Provider Note    Event Date/Time   First MD Initiated Contact with Patient 08/02/23 2311     (approximate)   History   Chief Complaint Allergic Reaction (Pt was making pizza tonight with new sauce and last week began taking new "fish pills". Pt c/o hives to abdomen and hands and also tongue swelling. EMS gave epi and benedryl. Swelling has gone down. 18g RAC. )   HPI  Hayley Hogan is a 78 y.o. female with past medical history of hypertension, diabetes, CAD, and rheumatoid arthritis who presents to the ED complaining of allergic reaction.  Patient reports that she was sitting and watching TV this evening when she suddenly developed itchy rash to both hands.  The rash rapidly spread up both arms and onto her chest and back, at which point she began to feel short of breath with some tongue swelling.  She reports feeling nauseous with 1 episode of vomiting, EMS was contacted and administered IM epinephrine in addition to 50 mg of oral Benadryl.  Patient states that she is feeling much better after the administration of these medications, tongue swelling is improved and she no longer feels short of breath.  She states that the rash has seemed to resolve and she denies any lightheadedness.  She denies any new foods or medications other than taking fish oil.     Physical Exam   Triage Vital Signs: ED Triage Vitals  Encounter Vitals Group     BP 08/02/23 2320 130/79     Systolic BP Percentile --      Diastolic BP Percentile --      Pulse Rate 08/02/23 2320 82     Resp 08/02/23 2320 18     Temp --      Temp src --      SpO2 08/02/23 2318 100 %     Weight 08/02/23 2321 162 lb (73.5 kg)     Height 08/02/23 2321 5\' 7"  (1.702 m)     Head Circumference --      Peak Flow --      Pain Score 08/02/23 2321 0     Pain Loc --      Pain Education --      Exclude from Growth Chart --     Most recent vital signs: Vitals:   08/03/23 0000 08/03/23 0130  BP:  126/70 129/72  Pulse: 80 86  Resp: 18 18  SpO2: 96% 100%    Constitutional: Alert and oriented. Eyes: Conjunctivae are normal. Head: Atraumatic. Nose: No congestion/rhinnorhea. Mouth/Throat: Mucous membranes are moist.  Mild tongue edema with no posterior oropharyngeal edema. Cardiovascular: Normal rate, regular rhythm. Grossly normal heart sounds.  2+ radial pulses bilaterally. Respiratory: Normal respiratory effort.  No retractions. Lungs CTAB. Gastrointestinal: Soft and nontender. No distention. Musculoskeletal: No lower extremity tenderness nor edema.  Neurologic:  Normal speech and language. No gross focal neurologic deficits are appreciated.    ED Results / Procedures / Treatments   Labs (all labs ordered are listed, but only abnormal results are displayed) Labs Reviewed  CBC WITH DIFFERENTIAL/PLATELET - Abnormal; Notable for the following components:      Result Value   WBC 12.8 (*)    Platelets 116 (*)    Neutro Abs 10.9 (*)    All other components within normal limits  BASIC METABOLIC PANEL - Abnormal; Notable for the following components:   CO2 20 (*)    Glucose, Bld 229 (*)  Calcium 8.6 (*)    All other components within normal limits    ED ECG REPORT I, Chesley Noon, the attending physician, personally viewed and interpreted this ECG.   Date: 08/03/2023  EKG Time: 1:27  Rate: 83  Rhythm: normal sinus rhythm  Axis: Normal  Intervals:none  ST&T Change: None   PROCEDURES:  Critical Care performed: Yes, see critical care procedure note(s)  .Critical Care  Performed by: Chesley Noon, MD Authorized by: Chesley Noon, MD   Critical care provider statement:    Critical care time (minutes):  30   Critical care time was exclusive of:  Separately billable procedures and treating other patients   Critical care was necessary to treat or prevent imminent or life-threatening deterioration of the following conditions: Anaphylaxis.   Critical care was  time spent personally by me on the following activities:  Development of treatment plan with patient or surrogate, discussions with consultants, evaluation of patient's response to treatment, examination of patient, ordering and review of laboratory studies, ordering and review of radiographic studies, ordering and performing treatments and interventions, pulse oximetry, re-evaluation of patient's condition and review of old charts   I assumed direction of critical care for this patient from another provider in my specialty: no     Care discussed with: admitting provider      MEDICATIONS ORDERED IN ED: Medications  methylPREDNISolone sodium succinate (SOLU-MEDROL) 125 mg/2 mL injection 125 mg (125 mg Intravenous Given 08/02/23 2346)  famotidine (PEPCID) IVPB 20 mg premix (0 mg Intravenous Stopped 08/03/23 0029)  EPINEPHrine (EPI-PEN) injection 0.3 mg (0.3 mg Intramuscular Given 08/03/23 0116)  diphenhydrAMINE (BENADRYL) injection 50 mg (50 mg Intravenous Given 08/03/23 0120)     IMPRESSION / MDM / ASSESSMENT AND PLAN / ED COURSE  I reviewed the triage vital signs and the nursing notes.                              78 y.o. female with past medical history of hypertension, diabetes, CAD, and rheumatoid arthritis who presents to the ED complaining of sudden onset diffuse itchy rash with nausea, vomiting, and tongue swelling.  Patient's presentation is most consistent with acute presentation with potential threat to life or bodily function.  Differential diagnosis includes, but is not limited to, anaphylaxis, allergic reaction, angioedema.  Patient nontoxic-appearing and in no acute distress, vital signs are unremarkable.  Symptoms seem to be resolving, she seems to have some mild ongoing tongue swelling but no posterior oropharyngeal edema and hives have resolved.  We will give IV Solu-Medrol and Pepcid in addition to the epinephrine and Benadryl she received with EMS.  Plan to observe here in the  ED for about 4 hours to ensure no recurrent signs of anaphylaxis.  After a couple of hours, patient started feeling like her tongue was swelling up again.  On my reassessment, tongue does seem more swollen but I again do not see any involvement of her posterior oropharynx.  She was given additional dose of IM epinephrine as well as additional IV Benadryl with improvement in tongue swelling, denies any difficulty breathing.  Given need for multiple doses of epinephrine, case discussed with hospitalist for admission.  EKG shows no evidence arrhythmia or ischemia and labs without significant anemia, leukocytosis, electrolyte abnormality, or AKI.      FINAL CLINICAL IMPRESSION(S) / ED DIAGNOSES   Final diagnoses:  Anaphylaxis, initial encounter     Rx / DC Orders  ED Discharge Orders     None        Note:  This document was prepared using Dragon voice recognition software and may include unintentional dictation errors.   Chesley Noon, MD 08/03/23 [phone]

## 2023-08-03 ENCOUNTER — Encounter: Payer: Self-pay | Admitting: Internal Medicine

## 2023-08-03 DIAGNOSIS — Z91018 Allergy to other foods: Secondary | ICD-10-CM

## 2023-08-03 DIAGNOSIS — T782XXA Anaphylactic shock, unspecified, initial encounter: Secondary | ICD-10-CM | POA: Diagnosis present

## 2023-08-03 DIAGNOSIS — E1165 Type 2 diabetes mellitus with hyperglycemia: Secondary | ICD-10-CM

## 2023-08-03 LAB — CBG MONITORING, ED
Glucose-Capillary: 286 mg/dL — ABNORMAL HIGH (ref 70–99)
Glucose-Capillary: 368 mg/dL — ABNORMAL HIGH (ref 70–99)

## 2023-08-03 LAB — CBC WITH DIFFERENTIAL/PLATELET
Abs Immature Granulocytes: 0.06 10*3/uL (ref 0.00–0.07)
Basophils Absolute: 0 10*3/uL (ref 0.0–0.1)
Basophils Relative: 0 %
Eosinophils Absolute: 0 10*3/uL (ref 0.0–0.5)
Eosinophils Relative: 0 %
HCT: 43 % (ref 36.0–46.0)
Hemoglobin: 13.7 g/dL (ref 12.0–15.0)
Immature Granulocytes: 1 %
Lymphocytes Relative: 10 %
Lymphs Abs: 1.3 10*3/uL (ref 0.7–4.0)
MCH: 27.5 pg (ref 26.0–34.0)
MCHC: 31.9 g/dL (ref 30.0–36.0)
MCV: 86.2 fL (ref 80.0–100.0)
Monocytes Absolute: 0.6 10*3/uL (ref 0.1–1.0)
Monocytes Relative: 5 %
Neutro Abs: 10.9 10*3/uL — ABNORMAL HIGH (ref 1.7–7.7)
Neutrophils Relative %: 84 %
Platelets: 116 10*3/uL — ABNORMAL LOW (ref 150–400)
RBC: 4.99 MIL/uL (ref 3.87–5.11)
RDW: 13.9 % (ref 11.5–15.5)
WBC: 12.8 10*3/uL — ABNORMAL HIGH (ref 4.0–10.5)
nRBC: 0 % (ref 0.0–0.2)

## 2023-08-03 LAB — BASIC METABOLIC PANEL
Anion gap: 15 (ref 5–15)
BUN: 16 mg/dL (ref 8–23)
CO2: 20 mmol/L — ABNORMAL LOW (ref 22–32)
Calcium: 8.6 mg/dL — ABNORMAL LOW (ref 8.9–10.3)
Chloride: 102 mmol/L (ref 98–111)
Creatinine, Ser: 0.77 mg/dL (ref 0.44–1.00)
GFR, Estimated: >60 mL/min (ref >60)
Glucose, Bld: 229 mg/dL — ABNORMAL HIGH (ref 70–99)
Potassium: 3.6 mmol/L (ref 3.5–5.1)
Sodium: 137 mmol/L (ref 135–145)

## 2023-08-03 LAB — HEMOGLOBIN A1C
Hgb A1c MFr Bld: 8.2 % — ABNORMAL HIGH (ref 4.8–5.6)
Mean Plasma Glucose: 188.64 mg/dL

## 2023-08-03 MED ORDER — FAMOTIDINE IN NACL 20-0.9 MG/50ML-% IV SOLN
20.0000 mg | Freq: Two times a day (BID) | INTRAVENOUS | Status: DC
  Administered 2023-08-03: 20 mg via INTRAVENOUS
  Filled 2023-08-03: qty 50

## 2023-08-03 MED ORDER — HYDROCODONE-ACETAMINOPHEN 5-325 MG PO TABS: 1.0000 | ORAL_TABLET | ORAL | Status: DC | PRN

## 2023-08-03 MED ORDER — FAMOTIDINE IN NACL 20-0.9 MG/50ML-% IV SOLN: 20.0000 mg | Freq: Two times a day (BID) | INTRAVENOUS | Status: DC

## 2023-08-03 MED ORDER — AMLODIPINE BESYLATE 5 MG PO TABS
2.5000 mg | ORAL_TABLET | Freq: Every day | ORAL | Status: DC
  Administered 2023-08-03: 2.5 mg via ORAL
  Filled 2023-08-03: qty 1

## 2023-08-03 MED ORDER — ENOXAPARIN SODIUM 40 MG/0.4ML IJ SOSY
40.0000 mg | PREFILLED_SYRINGE | INTRAMUSCULAR | Status: DC
  Filled 2023-08-03: qty 0.4

## 2023-08-03 MED ORDER — EPINEPHRINE 0.3 MG/0.3ML IJ SOAJ: 0.3000 mg | INTRAMUSCULAR | 0 refills | Status: AC | PRN

## 2023-08-03 MED ORDER — ONDANSETRON HCL 4 MG PO TABS: 4.0000 mg | ORAL_TABLET | Freq: Four times a day (QID) | ORAL | Status: DC | PRN

## 2023-08-03 MED ORDER — EPINEPHRINE 0.3 MG/0.3ML IJ SOAJ
0.3000 mg | Freq: Once | INTRAMUSCULAR | Status: AC
  Administered 2023-08-03: 0.3 mg via INTRAMUSCULAR
  Filled 2023-08-03: qty 0.3

## 2023-08-03 MED ORDER — INSULIN ASPART 100 UNIT/ML IJ SOLN: 0.0000 [IU] | Freq: Every day | INTRAMUSCULAR | Status: DC

## 2023-08-03 MED ORDER — INSULIN ASPART 100 UNIT/ML IJ SOLN
0.0000 [IU] | Freq: Three times a day (TID) | INTRAMUSCULAR | Status: DC
  Administered 2023-08-03: 15 [IU] via SUBCUTANEOUS
  Administered 2023-08-03: 8 [IU] via SUBCUTANEOUS
  Filled 2023-08-03 (×2): qty 1

## 2023-08-03 MED ORDER — METHYLPREDNISOLONE SODIUM SUCC 125 MG IJ SOLR
80.0000 mg | INTRAMUSCULAR | Status: DC
Start: 2023-08-03 — End: 2023-08-03
  Administered 2023-08-03: 80 mg via INTRAVENOUS
  Filled 2023-08-03: qty 2

## 2023-08-03 MED ORDER — EPINEPHRINE 0.3 MG/0.3ML IJ SOAJ: 0.3000 mg | Freq: Once | INTRAMUSCULAR | Status: DC | PRN

## 2023-08-03 MED ORDER — ACETAMINOPHEN 325 MG RE SUPP: 650.0000 mg | Freq: Four times a day (QID) | RECTAL | Status: DC | PRN

## 2023-08-03 MED ORDER — METOPROLOL TARTRATE 50 MG PO TABS
100.0000 mg | ORAL_TABLET | Freq: Two times a day (BID) | ORAL | Status: DC
  Administered 2023-08-03: 100 mg via ORAL
  Filled 2023-08-03: qty 2

## 2023-08-03 MED ORDER — NICOTINE 14 MG/24HR TD PT24
14.0000 mg | MEDICATED_PATCH | Freq: Every day | TRANSDERMAL | Status: DC
  Filled 2023-08-03: qty 1

## 2023-08-03 MED ORDER — ONDANSETRON HCL 4 MG/2ML IJ SOLN: 4.0000 mg | Freq: Four times a day (QID) | INTRAMUSCULAR | Status: DC | PRN

## 2023-08-03 MED ORDER — DIPHENHYDRAMINE HCL 50 MG/ML IJ SOLN
50.0000 mg | Freq: Once | INTRAMUSCULAR | Status: AC
  Administered 2023-08-03: 50 mg via INTRAVENOUS
  Filled 2023-08-03: qty 1

## 2023-08-03 MED ORDER — ACETAMINOPHEN 325 MG PO TABS: 650.0000 mg | ORAL_TABLET | Freq: Four times a day (QID) | ORAL | Status: DC | PRN

## 2023-08-03 MED ORDER — DIPHENHYDRAMINE HCL 50 MG/ML IJ SOLN: 50.0000 mg | Freq: Four times a day (QID) | INTRAMUSCULAR | Status: DC | PRN

## 2023-08-03 MED ORDER — ROSUVASTATIN CALCIUM 20 MG PO TABS
20.0000 mg | ORAL_TABLET | Freq: Every day | ORAL | Status: DC
  Administered 2023-08-03: 20 mg via ORAL
  Filled 2023-08-03 (×2): qty 1

## 2023-08-03 NOTE — ED Notes (Signed)
 Patient sitting on side of bed eating breakfast tray

## 2023-08-03 NOTE — Assessment & Plan Note (Signed)
 Blood sugar over 200 Monitor for worsening given Solu-Medrol treatment Sliding scale insulin coverage

## 2023-08-03 NOTE — ED Notes (Signed)
 CCMD called and pt placed on cardiac monitoring.

## 2023-08-03 NOTE — Care Management Obs Status (Signed)
 MEDICARE OBSERVATION STATUS NOTIFICATION   Patient Details  Name: Hayley Hogan MRN: [id number] Date of Birth: [birth year]   Medicare Observation Status Notification Given:  Yes    Chrisann Melaragno Aris Lot, LCSW 08/03/2023, 3:26 PM

## 2023-08-03 NOTE — ED Notes (Signed)
 Patient up to restroom with no assist. NAD noted

## 2023-08-03 NOTE — Care Management CC44 (Signed)
 Condition Code 44 Documentation Completed  Patient Details  Name: Hayley Hogan MRN: [id number] Date of Birth: 1946-05-20   Condition Code 44 given:  Yes Patient signature on Condition Code 44 notice:  Yes Documentation of 2 MD's agreement:  Yes Code 44 added to claim:  Yes    Erin Sons, LCSW 08/03/2023, 3:26 PM

## 2023-08-03 NOTE — ED Notes (Signed)
Up to bathroom. Steady gait

## 2023-08-03 NOTE — Discharge Summary (Signed)
 Physician Discharge Summary   Patient: Hayley Hogan MRN: [id number] DOB: 06-20-1945  Admit date:     08/02/2023  Discharge date: 08/03/23  Discharge Physician: Lurene Shadow   PCP: Garlan Fillers, MD   Recommendations at discharge:   Follow-up with PCP in 1 week  Discharge Diagnoses: Principal Problem:   Anaphylaxis Active Problems:   History of food allergy   Uncontrolled type 2 diabetes mellitus with hyperglycemia, without long-term current use of insulin (HCC)   PAD (peripheral artery disease) (HCC)   CAD in native artery   Hypertension   Rheumatoid arthritis(714.0)  Resolved Problems:   * No resolved hospital problems. *  Hospital Course:  Hayley Hogan is a 78 y.o. female with medical history significant for HTN, NIDDM, rheumatoid arthritis on low-dose prednisone, PAD, CAD s/p stent, history of food allergies, who presented to the hospital with allergic reaction.  She said she was making pizza with anew sauce and her symptoms started.  She also reported starting a new fish oil/cod liver oil about a week prior to admission.  She complained of hives on the abdomen and hands and had also developed swelling on the tongue.  EMS had administered epinephrine and Benadryl prior to admission. His symptoms had improved on arrival in the ED.  However, swelling in the tongue recurred so she was admitted to the hospital for observation.  She was treated with IV steroids, IV Pepcid, IV Benadryl and another dose of IM epinephrine (EpiPen).  All her symptoms have resolved.  She feels better and she wants to go home today.  She has been advised to avoid cod liver oil that was started recently.  She will be given a prescription for EpiPen autoinjection.  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  She understands that if anaphylaxis recurs, she will use the EpiPen injection immediately and call EMS.  She was also  provided with anaphylaxis emergency action plan.        Consultants: None Procedures performed: None  Disposition: Home Diet recommendation:  Discharge Diet Orders (From admission, onward)     Start     Ordered   08/03/23 0000  Diet - low sodium heart healthy        08/03/23 1448           Cardiac and Carb modified diet DISCHARGE MEDICATION: Allergies as of 08/03/2023       Reactions   Demerol Other (See Comments)   vomiting   Peanut-containing Drug Products    Mouth swelling        Medication List     STOP taking these medications    alendronate 70 MG tablet Commonly known as: FOSAMAX   losartan 100 MG tablet Commonly known as: COZAAR   traMADol 50 MG tablet Commonly known as: ULTRAM       TAKE these medications    amLODipine 2.5 MG tablet Commonly known as: NORVASC Take 2.5 mg by mouth daily.   EPINEPHrine 0.3 mg/0.3 mL Soaj injection Commonly known as: EPI-PEN Inject 0.3 mg into the muscle as needed for anaphylaxis.   metFORMIN 500 MG 24 hr tablet Commonly known as: GLUCOPHAGE-XR Take 1,000 mg by mouth 2 (two) times daily.   metoprolol tartrate 100 MG tablet Commonly known as: LOPRESSOR Take 100 mg by mouth 2 (two) times daily.   nitroGLYCERIN 0.4 MG SL tablet Commonly known as: NITROSTAT Place 1 tablet (0.4  mg total) under the tongue every 5 (five) minutes as needed for chest pain.   olmesartan 40 MG tablet Commonly known as: BENICAR Take 40 mg by mouth daily.   omeprazole 20 MG capsule Commonly known as: PRILOSEC Take 20 mg by mouth daily.   predniSONE 5 MG tablet Commonly known as: DELTASONE Take 5 mg by mouth daily with breakfast. What changed: Another medication with the same name was removed. Continue taking this medication, and follow the directions you see here.   rosuvastatin 20 MG tablet Commonly known as: CRESTOR Take 20 mg by mouth daily.   Trulicity 1.5 MG/0.5ML Soaj Generic drug: Dulaglutide Inject into the  skin once a week.   Vitamin D (Ergocalciferol) 1.25 MG (50000 UNIT) Caps capsule Commonly known as: DRISDOL Take 50,000 Units by mouth every 7 (seven) days. Wednesday        Discharge Exam: Filed Weights   08/02/23 2321  Weight: 73.5 kg   GEN: NAD SKIN: No rash or hives EYES: No pallor or icterus ENT: MMM, patent airway, no tongue swelling CV: RRR PULM: CTA B ABD: soft, ND, NT, +BS CNS: AAO x 3, non focal EXT: Chronic swelling of MCP joints of bilateral hands from rheumatoid arthritis.   Condition at discharge: good  The results of significant diagnostics from this hospitalization (including imaging, microbiology, ancillary and laboratory) are listed below for reference.   Imaging Studies: No results found.  Microbiology: Results for orders placed or performed during the hospital encounter of 02/23/19  SARS Coronavirus 2 [facility] order, Performed in [facility] hospital lab) Nasopharyngeal Nasopharyngeal Swab     Status: None   Collection Time: 02/23/19  5:50 PM   Specimen: Nasopharyngeal Swab  Result Value Ref Range Status   SARS Coronavirus 2 NEGATIVE NEGATIVE Final    Comment: (NOTE) If result is NEGATIVE SARS-CoV-2 target nucleic acids are NOT DETECTED. The SARS-CoV-2 RNA is generally detectable in upper and lower  respiratory specimens during the acute phase of infection. The lowest  concentration of SARS-CoV-2 viral copies this assay can detect is 250  copies / mL. A negative result does not preclude SARS-CoV-2 infection  and should not be used as the sole basis for treatment or other  patient management decisions.  A negative result may occur with  improper specimen collection / handling, submission of specimen other  than nasopharyngeal swab, presence of viral mutation(s) within the  areas targeted by this assay, and inadequate number of viral copies  (<250 copies / mL). A negative result must be combined with clinical  observations, patient history, and  epidemiological information. If result is POSITIVE SARS-CoV-2 target nucleic acids are DETECTED. The SARS-CoV-2 RNA is generally detectable in upper and lower  respiratory specimens dur ing the acute phase of infection.  Positive  results are indicative of active infection with SARS-CoV-2.  Clinical  correlation with patient history and other diagnostic information is  necessary to determine patient infection status.  Positive results do  not rule out bacterial infection or co-infection with other viruses. If result is PRESUMPTIVE POSTIVE SARS-CoV-2 nucleic acids MAY BE PRESENT.   A presumptive positive result was obtained on the submitted specimen  and confirmed on repeat testing.  While 2019 novel coronavirus  (SARS-CoV-2) nucleic acids may be present in the submitted sample  additional confirmatory testing may be necessary for epidemiological  and / or clinical management purposes  to differentiate between  SARS-CoV-2 and other Sarbecovirus currently known to infect humans.  If clinically indicated additional  testing with an alternate test  methodology [phone]) is advised. The SARS-CoV-2 RNA is generally  detectable in upper and lower respiratory sp ecimens during the acute  phase of infection. The expected result is Negative. Fact Sheet for Patients:  BoilerBrush.com.cy Fact Sheet for Healthcare Providers: https://pope.com/ This test is not yet approved or cleared by the [place] FDA and has been authorized for detection and/or diagnosis of SARS-CoV-2 by FDA under an Emergency Use Authorization (EUA).  This EUA will remain in effect (meaning this test can be used) for the duration of the COVID-19 declaration under Section 564(b)(1) of the Act, 21 U.S.C. section 360bbb-3(b)(1), unless the authorization is terminated or revoked sooner. Performed at [facility], [address] [place], [place] [address]   Blood  culture (routine x 2)     Status: None   Collection Time: 02/23/19  7:28 PM   Specimen: BLOOD  Result Value Ref Range Status   Specimen Description BLOOD BLOOD RIGHT WRIST  Final   Special Requests   Final    BOTTLES DRAWN AEROBIC AND ANAEROBIC Blood Culture adequate volume   Culture   Final    NO GROWTH 5 DAYS Performed at [facility], [address], [place], [place] [address]    Report Status 02/28/2019 FINAL  Final  Blood culture (routine x 2)     Status: None   Collection Time: 02/23/19  7:28 PM   Specimen: BLOOD  Result Value Ref Range Status   Specimen Description BLOOD RIGHT ANTECUBITAL  Final   Special Requests   Final    BOTTLES DRAWN AEROBIC AND ANAEROBIC Blood Culture adequate volume   Culture   Final    NO GROWTH 5 DAYS Performed at [facility], [address] [place], [place] [address]    Report Status 02/28/2019 FINAL  Final    Labs: CBC: Recent Labs  Lab 08/03/23 0117  WBC 12.8*  NEUTROABS 10.9*  HGB 13.7  HCT 43.0  MCV 86.2  PLT 116*   Basic Metabolic Panel: Recent Labs  Lab 08/03/23 0117  NA 137  K 3.6  CL 102  CO2 20*  GLUCOSE 229*  BUN 16  CREATININE 0.77  CALCIUM 8.6*   Liver Function Tests: No results for input(s): "AST", "ALT", "ALKPHOS", "BILITOT", "PROT", "ALBUMIN" in the last 168 hours. CBG: Recent Labs  Lab 08/03/23 0841 08/03/23 1347  GLUCAP 286* 368*    Discharge time spent: greater than 30 minutes.  Signed: Lurene Shadow, MD [facility] 08/03/2023

## 2023-08-03 NOTE — Assessment & Plan Note (Signed)
 History of food allergies Unclear trigger food versus recent fish pills versus longstanding ARB Hold ARB Epinephrine as needed Famotidine twice daily, Benadryl every 6, Solu-Medrol twice daily Close monitoring for worsening symptoms

## 2023-08-03 NOTE — H&P (Signed)
 History and Physical    Patient: Hayley Hogan OZD:[id number] DOB: [birth year] DOA: 08/02/2023 DOS: the patient was seen and examined on 08/03/2023 PCP: Garlan Fillers, MD  Patient coming from: Home  Chief Complaint:  Chief Complaint  Patient presents with   Allergic Reaction    Pt was making pizza tonight with new sauce and last week began taking new "fish pills". Pt c/o hives to abdomen and hands and also tongue swelling. EMS gave epi and benedryl. Swelling has gone down. 18g RAC.     HPI: Hayley Hogan is a 78 y.o. female with medical history significant for HTN, NIDDM, rheumatoid arthritis, PAD, CAD s/p stent, history of food allergies who presents with an allergic reaction.  Patient recently started taking a new fish pill and while sitting watching TV started developing hives in her hands which extending up into her back followed by tongue swelling and trouble breathing.  911 was called and they administered an Benadryl with some improvement.  Upon arrival, she reportedly had marked improvement in swelling and Solu-Medrol and Pepcid were administered however during ED workup, she again started swelling in her tongue and the back of her throat requiring another dose of epinephrine needs to reason for hospitalization.  Additional ED course: Vitals were normal in the ED Labs were notable for mild leukocytosis of 12,800 and mild hyperglycemia of 229 but otherwise unremarkable An EKG personally reviewed and interpreted showed NSR at 83 with no acute ST-T wave changes No imaging done Hospitalist consulted for admission.     Past Medical History:  Diagnosis Date   DM type 2 (diabetes mellitus, type 2) (HCC)    Fatty liver disease, nonalcoholic    Hypertension    Migraines    "when I was very young; before 1968"   Neuromuscular disorder (HCC)    NSTEMI (non-ST elevated myocardial infarction) (HCC)    Rheumatoid arthritis(714.0)    Transverse myelitis (HCC) 1979   w/transient inability  to walk   Past Surgical History:  Procedure Laterality Date   BREAST LUMPECTOMY  1970   right   COLONOSCOPY N/A 05/11/2013   Procedure: COLONOSCOPY;  Surgeon: Meryl Dare, MD;  Location: [facility] ENDOSCOPY;  Service: Endoscopy;  Laterality: N/A;   CORONARY ANGIOPLASTY WITH STENT PLACEMENT  10/09/14   Xience DES to RCA   FRACTURE SURGERY     plates and screws in right ankle   LEFT HEART CATHETERIZATION WITH CORONARY ANGIOGRAM N/A 10/09/2014   Procedure: LEFT HEART CATHETERIZATION WITH CORONARY ANGIOGRAM;  Surgeon: Marykay Lex, MD;  Location: [facility] CATH LAB;  Service: Cardiovascular;  Laterality: N/A;   ORIF ANKLE FRACTURE  09/2008   right   TUBAL LIGATION     VAGINAL HYSTERECTOMY  1982   Social History:  reports that she quit smoking about 8 years ago. Her smoking use included cigarettes. She started smoking about 43 years ago. She has a 7 pack-year smoking history. She has never used smokeless tobacco. She reports current alcohol use of about 1.0 standard drink of alcohol per week. She reports current drug use. Drug: Marijuana.  Allergies  Allergen Reactions   Demerol Other (See Comments)    vomiting   Peanut-Containing Drug Products     Mouth swelling    Family History  Problem Relation Age of Onset   Hypertension Mother    Cancer Father    Heart Problems Maternal Grandfather    Cancer Brother    Cancer Brother    Other Brother  GSW   Stroke Sister        x2   Hypertension Sister        x2   COPD Sister     Prior to Admission medications   Medication Sig Start Date End Date Taking? Authorizing Provider  amLODipine (NORVASC) 2.5 MG tablet Take 2.5 mg by mouth daily.   Yes [provider]  Dulaglutide (TRULICITY) 1.5 MG/0.5ML SOPN Inject into the skin once a week.   Yes [provider]  metFORMIN (GLUCOPHAGE-XR) 500 MG 24 hr tablet Take 1,000 mg by mouth 2 (two) times daily.  11/26/14  Yes [provider]  metoprolol (LOPRESSOR) 100 MG  tablet Take 100 mg by mouth 2 (two) times daily.   Yes [provider]  nitroGLYCERIN (NITROSTAT) 0.4 MG SL tablet Place 1 tablet (0.4 mg total) under the tongue every 5 (five) minutes as needed for chest pain. 10/30/14  Yes Leone Brand, NP  olmesartan (BENICAR) 40 MG tablet Take 40 mg by mouth daily. 11/13/22  Yes [provider]  omeprazole (PRILOSEC) 20 MG capsule Take 20 mg by mouth 2 (two) times daily. 08/28/15  Yes [provider]  predniSONE (DELTASONE) 20 MG tablet Take 2 tablets (40 mg total) by mouth daily. 04/19/23  Yes White, Adrienne R, NP  predniSONE (DELTASONE) 5 MG tablet Take 5 mg by mouth daily with breakfast.   Yes [provider]  rosuvastatin (CRESTOR) 20 MG tablet Take 20 mg by mouth daily. 04/21/20  Yes [provider]  traMADol (ULTRAM) 50 MG tablet Take 50 mg by mouth every 6 (six) hours as needed for moderate pain (pain score 4-6).   Yes [provider]  Vitamin D, Ergocalciferol, (DRISDOL) 50000 UNITS CAPS capsule Take 50,000 Units by mouth every 7 (seven) days. Wednesday   Yes [provider]  alendronate (FOSAMAX) 70 MG tablet Take 70 mg by mouth once a week. 07/02/20   [provider]  losartan (COZAAR) 100 MG tablet Take 100 mg by mouth daily.    [provider]    Physical Exam: Vitals:   08/02/23 2320 08/02/23 2321 08/03/23 0000 08/03/23 0130  BP: 130/79  126/70 129/72  Pulse: 82  80 86  Resp: 18  18 18   SpO2: 100%  96% 100%  Weight:  73.5 kg    Height:  5\' 7"  (1.702 m)     Physical Exam Vitals and nursing note reviewed.  Constitutional:      General: She is not in acute distress. HENT:     Head: Normocephalic and atraumatic.     Mouth/Throat:     Comments: Mild swelling noticed of the tongue Cardiovascular:     Rate and Rhythm: Normal rate and regular rhythm.     Heart sounds: Normal heart sounds.  Pulmonary:     Effort: Pulmonary effort is normal.     Breath sounds:  Normal breath sounds.  Abdominal:     Palpations: Abdomen is soft.     Tenderness: There is no abdominal tenderness.  Neurological:     Mental Status: Mental status is at baseline.     Labs on Admission: I have personally reviewed following labs and imaging studies  CBC: Recent Labs  Lab 08/03/23 0117  WBC 12.8*  NEUTROABS 10.9*  HGB 13.7  HCT 43.0  MCV 86.2  PLT 116*   Basic Metabolic Panel: Recent Labs  Lab 08/03/23 0117  NA 137  K 3.6  CL 102  CO2 20*  GLUCOSE 229*  BUN 16  CREATININE 0.77  CALCIUM 8.6*   GFR: Estimated Creatinine Clearance: 59.1 mL/min (by C-G formula based on SCr of 0.77 mg/dL). Liver Function Tests: No results for input(s): "AST", "ALT", "ALKPHOS", "BILITOT", "PROT", "ALBUMIN" in the last 168 hours. No results for input(s): "LIPASE", "AMYLASE" in the last 168 hours. No results for input(s): "AMMONIA" in the last 168 hours. Coagulation Profile: No results for input(s): "INR", "PROTIME" in the last 168 hours. Cardiac Enzymes: No results for input(s): "CKTOTAL", "CKMB", "CKMBINDEX", "TROPONINI" in the last 168 hours. BNP (last 3 results) No results for input(s): "PROBNP" in the last 8760 hours. HbA1C: No results for input(s): "HGBA1C" in the last 72 hours. CBG: No results for input(s): "GLUCAP" in the last 168 hours. Lipid Profile: No results for input(s): "CHOL", "HDL", "LDLCALC", "TRIG", "CHOLHDL", "LDLDIRECT" in the last 72 hours. Thyroid Function Tests: No results for input(s): "TSH", "T4TOTAL", "FREET4", "T3FREE", "THYROIDAB" in the last 72 hours. Anemia Panel: No results for input(s): "VITAMINB12", "FOLATE", "FERRITIN", "TIBC", "IRON", "RETICCTPCT" in the last 72 hours. Urine analysis:    Component Value Date/Time   COLORURINE YELLOW (A) 02/23/2019 1750   APPEARANCEUR CLEAR (A) 02/23/2019 1750   LABSPEC 1.029 02/23/2019 1750   PHURINE 5.0 02/23/2019 1750   GLUCOSEU >=500 (A) 02/23/2019 1750   HGBUR NEGATIVE 02/23/2019 1750    BILIRUBINUR NEGATIVE 02/23/2019 1750   KETONESUR 5 (A) 02/23/2019 1750   PROTEINUR NEGATIVE 02/23/2019 1750   UROBILINOGEN 1.0 05/09/2013 1743   NITRITE NEGATIVE 02/23/2019 1750   LEUKOCYTESUR NEGATIVE 02/23/2019 1750    Radiological Exams on Admission: No results found.   Data Reviewed: Relevant notes from primary care and specialist visits, past discharge summaries as available in EHR, including Care Everywhere. Prior diagnostic testing as pertinent to current admission diagnoses Updated medications and problem lists for reconciliation ED course, including vitals, labs, imaging, treatment and response to treatment Triage notes, nursing and pharmacy notes and ED provider's notes Notable results as noted in HPI   Assessment and Plan: * Anaphylaxis History of food allergies Unclear trigger food versus recent fish pills versus longstanding ARB Hold ARB Epinephrine as needed Famotidine twice daily, Benadryl every 6, Solu-Medrol twice daily Close monitoring for worsening symptoms  Uncontrolled type 2 diabetes mellitus with hyperglycemia, without long-term current use of insulin (HCC) Blood sugar over 200 Monitor for worsening given Solu-Medrol treatment Sliding scale insulin coverage  PAD (peripheral artery disease) (HCC) CAD No acute issues suspected at this time Continue rosuvastatin and metoprolol and nitroglycerin sublingual as needed  Hypertension Holding ARB due to unknown allergen Continue metoprolol and amlodipine    DVT prophylaxis: Lovenox  Consults: none  Advance Care Planning:   Code Status: Prior   Family Communication: none  Disposition Plan: Back to previous home environment  Severity of Illness: The appropriate patient status for this patient is INPATIENT. Inpatient status is judged to be reasonable and necessary in order to provide the required intensity of service to ensure the patient's safety. The patient's presenting symptoms, physical exam  findings, and initial radiographic and laboratory data in the context of their chronic comorbidities is felt to place them at high risk for further clinical deterioration. Furthermore, it is not anticipated that the patient will be medically stable for discharge from the hospital within 2 midnights of admission.   * I certify that at the point of admission it is my clinical judgment that the patient will require inpatient hospital care spanning beyond 2 midnights from the point of  admission due to high intensity of service, high risk for further deterioration and high frequency of surveillance required.*  Author: Andris Baumann, MD 08/03/2023 2:37 AM  For on call review [email or web address].

## 2023-08-03 NOTE — Assessment & Plan Note (Signed)
 CAD No acute issues suspected at this time Continue rosuvastatin and metoprolol and nitroglycerin sublingual as needed

## 2023-08-03 NOTE — Assessment & Plan Note (Signed)
 Holding ARB due to unknown allergen Continue metoprolol and amlodipine

## 2023-09-28 ENCOUNTER — Other Ambulatory Visit: Payer: Self-pay | Admitting: Physician Assistant

## 2023-09-28 ENCOUNTER — Ambulatory Visit
Admission: RE | Admit: 2023-09-28 | Discharge: 2023-09-28 | Disposition: A | Discharge status: Home / Self Care | Source: Ambulatory Visit | Attending: Physician Assistant | Admitting: Physician Assistant

## 2023-09-28 DIAGNOSIS — R2242 Localized swelling, mass and lump, left lower limb: Secondary | ICD-10-CM | POA: Diagnosis present

## 2023-11-09 ENCOUNTER — Ambulatory Visit (INDEPENDENT_AMBULATORY_CARE_PROVIDER_SITE_OTHER): Admitting: General Practice

## 2023-11-09 ENCOUNTER — Encounter: Payer: Self-pay | Admitting: General Practice

## 2023-11-09 VITALS — BP 124/80 | HR 87 | Temp 97.6°F | Ht 66.5 in | Wt 155.0 lb

## 2023-11-09 DIAGNOSIS — I10 Essential (primary) hypertension: Secondary | ICD-10-CM | POA: Diagnosis not present

## 2023-11-09 DIAGNOSIS — E782 Mixed hyperlipidemia: Secondary | ICD-10-CM | POA: Diagnosis not present

## 2023-11-09 DIAGNOSIS — M069 Rheumatoid arthritis, unspecified: Secondary | ICD-10-CM | POA: Diagnosis not present

## 2023-11-09 DIAGNOSIS — Z7689 Persons encountering health services in other specified circumstances: Secondary | ICD-10-CM | POA: Insufficient documentation

## 2023-11-09 DIAGNOSIS — Z7985 Long-term (current) use of injectable non-insulin antidiabetic drugs: Secondary | ICD-10-CM

## 2023-11-09 DIAGNOSIS — Z7984 Long term (current) use of oral hypoglycemic drugs: Secondary | ICD-10-CM

## 2023-11-09 DIAGNOSIS — E1165 Type 2 diabetes mellitus with hyperglycemia: Secondary | ICD-10-CM

## 2023-11-09 DIAGNOSIS — M7989 Other specified soft tissue disorders: Secondary | ICD-10-CM | POA: Insufficient documentation

## 2023-11-09 LAB — COMPREHENSIVE METABOLIC PANEL WITH GFR
ALT: 14 U/L (ref 0–35)
AST: 9 U/L (ref 0–37)
Albumin: 4.2 g/dL (ref 3.5–5.2)
Alkaline Phosphatase: 38 U/L — ABNORMAL LOW (ref 39–117)
BUN: 21 mg/dL (ref 6–23)
CO2: 26 meq/L (ref 19–32)
Calcium: 9.1 mg/dL (ref 8.4–10.5)
Chloride: 99 meq/L (ref 96–112)
Creatinine, Ser: 0.75 mg/dL (ref 0.40–1.20)
GFR: 77.74 mL/min (ref >60.00)
Glucose, Bld: 327 mg/dL — ABNORMAL HIGH (ref 70–99)
Potassium: 4.5 meq/L (ref 3.5–5.1)
Sodium: 135 meq/L (ref 135–145)
Total Bilirubin: 0.5 mg/dL (ref 0.2–1.2)
Total Protein: 7.1 g/dL (ref 6.0–8.3)

## 2023-11-09 LAB — CBC
HCT: 41.2 % (ref 36.0–46.0)
Hemoglobin: 13.6 g/dL (ref 12.0–15.0)
MCHC: 32.9 g/dL (ref 30.0–36.0)
MCV: 82 fl (ref 78.0–100.0)
Platelets: 144 10*3/uL — ABNORMAL LOW (ref 150.0–400.0)
RBC: 5.03 Mil/uL (ref 3.87–5.11)
RDW: 15.3 % (ref 11.5–15.5)
WBC: 6.7 10*3/uL (ref 4.0–10.5)

## 2023-11-09 LAB — MICROALBUMIN / CREATININE URINE RATIO
Creatinine,U: 86.3 mg/dL
Microalb Creat Ratio: 8.3 mg/g (ref 0.0–30.0)
Microalb, Ur: 0.7 mg/dL (ref 0.0–1.9)

## 2023-11-09 LAB — HEMOGLOBIN A1C: Hgb A1c MFr Bld: 10.7 % — ABNORMAL HIGH (ref 4.6–6.5)

## 2023-11-09 MED ORDER — TRULICITY 1.5 MG/0.5ML ~~LOC~~ SOAJ: 1.5000 mg | SUBCUTANEOUS | 0 refills | Status: DC

## 2023-11-09 NOTE — Patient Instructions (Addendum)
 Stop by the lab prior to leaving today. I will notify you of your results once received.   Schedule diabetic eye exam.   You will either be contacted via phone regarding your referral to rheumatologist , or you may receive a letter on your MyChart portal from our referral team with instructions for scheduling an appointment. Please let us  know if you have not been contacted by anyone within two weeks.  Follow up in 4 weeks.   Let me know if you haven't heard back from the pharmacy by the end of the week.   It was a pleasure to meet you today! Please don't hesitate to contact me with any questions. Welcome to [facility]!

## 2023-11-09 NOTE — Assessment & Plan Note (Signed)
 EMR reviewed briefly.

## 2023-11-09 NOTE — Progress Notes (Addendum)
 New Patient Office Visit  Subjective    Patient ID: Cailie Bosshart, female    DOB: 07/31/1945  Age: 78 y.o. MRN: [id number]  CC:  Chief Complaint  Patient presents with   New Patient (Initial Visit)    Needs Trulicity  refilled; been out since 09/14/23.    HPI Paulett Kaufhold is a 78 y.o. female presents to establish care.   Last PCP/physical/labs: Dr. Adan Holms.   DM2: currently managed on Metformin  XR 1000 mg BID and Trulicity  1.5 mg once weekly. She does need a refill for Trulicity . She has been out of it since April. She does check her blood sugar daily every morning is ranging between 150-300. She has been taking her of her husband. She does report that her diet has not been good. Her last A1c was 8.2 in February 2025. She has been getting her medication with [employer] [facility] direct and her medication has been free.   HTN: currently managed on Amlodipine  2.5 mg once daily, metoprolol  100 mg, Olmesartan 40 mg once daily. Does not check her BP at home. Denies any blurred vision, headaches, chest pain or shortness of breath.   RA: currently managed on Prednisone  5 mg. She had a rheumatologist in [place] but he retired. She needs a referral for a rheumatologist in [place].  Left leg swelling: ongoing for many years. She has had work up done and ruled out a clot. She does take her of her husband. Does not elevate legs and does not wear compression. She had a heart attack many years ago and had a stent placed. She denies any chest pain, shortness of breath or difficulty breathing. She endorses fatigue which she relates to "doing too much". She has seen [facility]. She had an injection on 10/20/23.   HLD: diagnosed many years ago. Currently on Rosuvastatin  20 mg once daily. She is followed by cardiology and sees them once a year.    Outpatient Encounter Medications as of 11/09/2023  Medication Sig   amLODipine  (NORVASC ) 2.5 MG tablet Take 2.5 mg by mouth daily.   EPINEPHrine  0.3 mg/0.3 mL IJ  SOAJ injection Inject 0.3 mg into the muscle as needed for anaphylaxis.   metFORMIN  (GLUCOPHAGE -XR) 500 MG 24 hr tablet Take 1,000 mg by mouth 2 (two) times daily.    metoprolol  (LOPRESSOR ) 100 MG tablet Take 100 mg by mouth 2 (two) times daily.   nitroGLYCERIN  (NITROSTAT ) 0.4 MG SL tablet Place 1 tablet (0.4 mg total) under the tongue every 5 (five) minutes as needed for chest pain.   olmesartan (BENICAR) 40 MG tablet Take 40 mg by mouth daily.   omeprazole (PRILOSEC) 20 MG capsule Take 20 mg by mouth daily.   predniSONE  (DELTASONE ) 5 MG tablet Take 5 mg by mouth daily with breakfast.   rosuvastatin  (CRESTOR ) 20 MG tablet Take 20 mg by mouth daily.   traMADol  (ULTRAM ) 50 MG tablet Take 50 mg by mouth every 6 (six) hours as needed.   Vitamin D , Ergocalciferol , (DRISDOL ) 50000 UNITS CAPS capsule Take 50,000 Units by mouth every 7 (seven) days. Wednesday   Dulaglutide  (TRULICITY ) 1.5 MG/0.5ML SOAJ Inject 1.5 mg into the skin once a week.   [DISCONTINUED] Dulaglutide  (TRULICITY ) 1.5 MG/0.5ML SOAJ Inject 1.5 mg into the skin once a week.   [DISCONTINUED] Dulaglutide  (TRULICITY ) 1.5 MG/0.5ML SOPN Inject into the skin once a week.   No facility-administered encounter medications on file as of 11/09/2023.    Past Medical History:  Diagnosis Date   Arthritis  DM type 2 (diabetes mellitus, type 2) (HCC)    Fatty liver disease, nonalcoholic    Hypertension    Migraines    "when I was very young; before 1968"   Neuromuscular disorder (HCC)    NSTEMI (non-ST elevated myocardial infarction) (HCC)    Rheumatoid arthritis(714.0)    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   w/transient inability to walk    Past Surgical History:  Procedure Laterality Date   BREAST LUMPECTOMY  1970   right   COLONOSCOPY N/A 05/11/2013   Procedure: COLONOSCOPY;  Surgeon: Asencion Blacksmith, MD;  Location: [facility] ENDOSCOPY;  Service: Endoscopy;  Laterality: N/A;   CORONARY ANGIOPLASTY WITH STENT PLACEMENT  10/09/14    Xience DES to RCA   FRACTURE SURGERY     plates and screws in right ankle   LEFT HEART CATHETERIZATION WITH CORONARY ANGIOGRAM N/A 10/09/2014   Procedure: LEFT HEART CATHETERIZATION WITH CORONARY ANGIOGRAM;  Surgeon: Arleen Lacer, MD;  Location: [facility] CATH LAB;  Service: Cardiovascular;  Laterality: N/A;   ORIF ANKLE FRACTURE  09/2008   right   TUBAL LIGATION     VAGINAL HYSTERECTOMY  1982    Family History  Problem Relation Age of Onset   Hypertension Mother    Cancer Father    Heart Problems Maternal Grandfather    Cancer Brother    Cancer Brother    Other Brother        GSW   Intellectual disability Brother    Stroke Sister        x2   Diabetes Sister    Hypertension Sister        x2   COPD Sister    Diabetes Sister    Stroke Sister     Social History   Socioeconomic History   Marital status: Married    Spouse name: Not on file   Number of children: Not on file   Years of education: Not on file   Highest education level: Associate degree: occupational, Scientist, product/process development, or vocational program  Occupational History   Not on file  Tobacco Use   Smoking status: Former    Current packs/day: 0.00    Average packs/day: 0.6 packs/day for 73.0 years (45.0 ttl pk-yrs)    Types: Cigarettes    Start date: 10/08/1979    Quit date: 06/14/2016    Years since quitting: 7.4   Smokeless tobacco: Never   Tobacco comments:    Stopped for 6 months in 2016 and for good 06/14/2016  Substance and Sexual Activity   Alcohol use: Yes    Alcohol/week: 1.0 standard drink of alcohol    Types: 1 Glasses of wine per week    Comment: Drink wine occasionally   Drug use: Yes    Types: Marijuana    Comment: "marijuana in the 1960's"   Sexual activity: Not Currently    Birth control/protection: Post-menopausal  Other Topics Concern   Not on file  Social History Narrative   Not on file   Social Drivers of Health   Financial Resource Strain: Low Risk  (11/08/2023)   Overall Financial Resource  Strain (CARDIA)    Difficulty of Paying Living Expenses: Not very hard  Food Insecurity: No Food Insecurity (11/08/2023)   Hunger Vital Sign    Worried About Running Out of Food in the Last Year: Never true    Ran Out of Food in the Last Year: Never true  Transportation Needs: No Transportation Needs (11/08/2023)   PRAPARE - Transportation  Lack of Transportation (Medical): No    Lack of Transportation (Non-Medical): No  Physical Activity: Unknown (11/08/2023)   Exercise Vital Sign    Days of Exercise per Week: 0 days    Minutes of Exercise per Session: Not on file  Stress: Stress Concern Present (11/08/2023)   [employer] of Occupational Health - Occupational Stress Questionnaire    Feeling of Stress : To some extent  Social Connections: Moderately Isolated (11/08/2023)   Social Connection and Isolation Panel [NHANES]    Frequency of Communication with Friends and Family: Twice a week    Frequency of Social Gatherings with Friends and Family: Once a week    Attends Religious Services: Never    Database administrator or Organizations: No    Attends Banker Meetings: Never    Marital Status: Married  Catering manager Violence: Not At Risk (08/03/2023)   Humiliation, Afraid, Rape, and Kick questionnaire    Fear of Current or Ex-Partner: No    Emotionally Abused: No    Physically Abused: No    Sexually Abused: No    Review of Systems  Constitutional:  Negative for chills and fever.  Respiratory:  Negative for shortness of breath.   Cardiovascular:  Positive for leg swelling. Negative for chest pain.  Gastrointestinal:  Negative for abdominal pain, constipation, diarrhea, heartburn, nausea and vomiting.  Genitourinary:  Negative for dysuria, frequency and urgency.  Neurological:  Negative for dizziness and headaches.  Endo/Heme/Allergies:  Negative for polydipsia.  Psychiatric/Behavioral:  Negative for depression and suicidal ideas. The patient is not  nervous/anxious.         Objective    BP 124/80 (BP Location: Left Arm, Patient Position: Sitting, Cuff Size: Normal)   Pulse 87   Temp 97.6 F (36.4 C) (Oral)   Ht 5' 6.5" (1.689 m)   Wt 155 lb (70.3 kg)   SpO2 97%   BMI 24.64 kg/m   Physical Exam Vitals and nursing note reviewed.  Constitutional:      Appearance: Normal appearance.  Cardiovascular:     Rate and Rhythm: Normal rate and regular rhythm.     Pulses: Normal pulses.          Dorsalis pedis pulses are 2+ on the right side and 2+ on the left side.       Posterior tibial pulses are 2+ on the right side and 2+ on the left side.     Heart sounds: Normal heart sounds.  Pulmonary:     Effort: Pulmonary effort is normal.     Breath sounds: Normal breath sounds.  Musculoskeletal:     Right lower leg: 2+ Edema present.     Left lower leg: 2+ Edema present.  Neurological:     Mental Status: She is alert and oriented to person, place, and time.  Psychiatric:        Mood and Affect: Mood normal.        Behavior: Behavior normal.        Thought Content: Thought content normal.        Judgment: Judgment normal.         Assessment & Plan:  Uncontrolled type 2 diabetes mellitus with hyperglycemia, without long-term current use of insulin  (HCC) Assessment & Plan: Uncontrolled.  A1c 8.2 in February, 2025. Discussed treatment options, diabetic diet and medication adherence.   Referral placed for [facility]-pharmacist to help with receiving Trulicity . Rx sent.  Continue Metformin  XR 1000 mg BID.  Foot exam - at  next visit.  Pneumonia vaccine- UTD.  Urine ACR- due and pending. Hemoglobin A1c- due and pending Statin- currently managed on Rosuvastatin .  F/u in 4 weeks after restarting Trulicity .  Orders: -     Hemoglobin A1c -     Microalbumin / creatinine urine ratio -     AMB Referral [facility] Care Management -     CBC -     Comprehensive metabolic panel with GFR -     Trulicity ; Inject 1.5 mg into the skin once a  week.  Dispense: 2 mL; Refill: 1  Establishing care with new doctor, encounter for Assessment & Plan: EMR reviewed briefly.    Rheumatoid arthritis, involving unspecified site, unspecified whether rheumatoid factor present [facility]) Assessment & Plan: Controlled.  Continue Prednisone  5 mg once daily. Suspect this could be affecting her A1c.  Referral placed for new rheumatologist.  Orders: -     Ambulatory referral to Rheumatology  Rheumatoid arthritis of wrist, unspecified laterality, unspecified whether rheumatoid factor present [facility]) Assessment & Plan: Controlled.  Continue Prednisone  5 mg once daily. Suspect this could be affecting her A1c.  Referral placed for new rheumatologist.   Mixed hyperlipidemia Assessment & Plan: Controlled per patient.  Continue Rosuvastatin  20 mg once daily.  Will check lipid panel at next visit.   Essential hypertension, benign Assessment & Plan: Controlled.  Continue metoprolol  100 mg once daily, Olmesartan 40 mg once daily.  We did discuss a two week break from Amlodipine  due to left leg/ankle swelling.   Will continue to monitor.   Leg swelling Assessment & Plan: Bilateral.  Chronic.  Exam stable.  Recently evaluated by ortho and reports that she had an ultrasound to rule out a clot and it was negative.  Discussed elevation of legs when sitting and compression socks. Will continue monitor.  ER precautions provided.    Return in about 4 weeks (around 12/07/2023) for DM.   Jolanda Nation, NP

## 2023-11-09 NOTE — Assessment & Plan Note (Addendum)
 Controlled.  Continue metoprolol  100 mg once daily, Olmesartan 40 mg once daily.  We did discuss a two week break from Amlodipine  due to left leg/ankle swelling.   Will continue to monitor.

## 2023-11-09 NOTE — Assessment & Plan Note (Signed)
 Controlled.  Continue Prednisone  5 mg once daily. Suspect this could be affecting her A1c.  Referral placed for new rheumatologist.

## 2023-11-09 NOTE — Assessment & Plan Note (Signed)
 Controlled per patient.  Continue Rosuvastatin  20 mg once daily.  Will check lipid panel at next visit.

## 2023-11-09 NOTE — Assessment & Plan Note (Signed)
 Uncontrolled.  A1c 8.2 in February, 2025. Discussed treatment options, diabetic diet and medication adherence.   Referral placed for [facility]-pharmacist to help with receiving Trulicity. Rx sent.  Continue Metformin  XR 1000 mg BID.  Foot exam - at next visit.  Pneumonia vaccine- UTD.  Urine ACR- due and pending. Hemoglobin A1c- due and pending Statin- currently managed on Rosuvastatin .  F/u in 4 weeks after restarting Trulicity.

## 2023-11-09 NOTE — Assessment & Plan Note (Addendum)
 Bilateral.  Chronic.  Exam stable.  Recently evaluated by ortho and reports that she had an ultrasound to rule out a clot and it was negative.  Discussed elevation of legs when sitting and compression socks. Will continue monitor.  ER precautions provided.

## 2023-11-10 ENCOUNTER — Other Ambulatory Visit: Payer: Self-pay | Admitting: Pharmacist

## 2023-11-10 ENCOUNTER — Telehealth: Payer: Self-pay

## 2023-11-10 ENCOUNTER — Ambulatory Visit: Payer: Self-pay | Admitting: General Practice

## 2023-11-10 ENCOUNTER — Other Ambulatory Visit: Payer: Self-pay | Admitting: General Practice

## 2023-11-10 ENCOUNTER — Encounter: Payer: Self-pay | Admitting: *Deleted

## 2023-11-10 DIAGNOSIS — I700 Atherosclerosis of aorta: Secondary | ICD-10-CM

## 2023-11-10 DIAGNOSIS — I2510 Atherosclerotic heart disease of native coronary artery without angina pectoris: Secondary | ICD-10-CM

## 2023-11-10 DIAGNOSIS — I10 Essential (primary) hypertension: Secondary | ICD-10-CM

## 2023-11-10 DIAGNOSIS — I739 Peripheral vascular disease, unspecified: Secondary | ICD-10-CM

## 2023-11-10 DIAGNOSIS — I249 Acute ischemic heart disease, unspecified: Secondary | ICD-10-CM

## 2023-11-10 DIAGNOSIS — E1165 Type 2 diabetes mellitus with hyperglycemia: Secondary | ICD-10-CM

## 2023-11-10 DIAGNOSIS — R931 Abnormal findings on diagnostic imaging of heart and coronary circulation: Secondary | ICD-10-CM

## 2023-11-10 MED ORDER — TIRZEPATIDE 2.5 MG/0.5ML ~~LOC~~ SOAJ: 2.5000 mg | SUBCUTANEOUS | 0 refills | Status: DC

## 2023-11-10 NOTE — Progress Notes (Signed)
 Brief Telephone Documentation Reason for Call: [employer] question  Summary of Call: Patient states she was previously enrolled in [place] for Trulicity with a different prescriber and was not re-enrolled for 2025.   Has therefore been off of Trulicity since ~March. Her last dose in March was 3.0 mg weekly dose. Denies any history with tolerability concerns or side effects historically.   Follow Up: Patient chart sent to [facility] Patient assistance team for assistance with re-enrollment in [place] cares through [facility].   Daron Ellen, PharmD Clinical Pharmacist [facility] [phone]

## 2023-11-10 NOTE — Telephone Encounter (Signed)
 Gave pt a call to let her know I will be mail out [employer] application for Trulicity,spoke with pt and will be on the look  for her application and will mail back once fill out on the envelop provide. I have also fax provider portion today and will following up.

## 2023-11-10 NOTE — Telephone Encounter (Signed)
 Copied from CRM [id number]. Topic: Clinical - Prescription Issue >> Nov 10, 2023  3:16 PM Magdalene [employer] wrote: Reason for CRM: patient calling to inform provider that tirzepatide  (MOUNJARO ) 2.5 MG/0.5ML Pen will cost $254.98 after insurance and she would like to see if there are any other options.

## 2023-11-10 NOTE — Telephone Encounter (Signed)
 Spoke with patient and she is working on Physicist, medical with our pharmacist in the office.

## 2023-11-12 ENCOUNTER — Telehealth: Payer: Self-pay

## 2023-11-12 NOTE — Progress Notes (Signed)
 Complex Care Management Note  Care Guide Note 11/12/2023 Name: Hayley Hogan MRN: [id number] DOB: 1945-12-22  Hayley Hogan is a 78 y.o. year old female who sees Jolanda Nation, NP for primary care. I reached out to Cherylin Corrigan by phone today to offer complex care management services.  Hayley Hogan was given information about Complex Care Management services today including:   The Complex Care Management services include support from the care team which includes your Nurse Care Manager, Clinical Social Worker, or Pharmacist.  The Complex Care Management team is here to help remove barriers to the health concerns and goals most important to you. Complex Care Management services are voluntary, and the patient may decline or stop services at any time by request to their care team member.   Follow up plan:  Patient outreached by Llana Rile, RPH on 11/10/23. Patient states no additional needs at this time.  Encounter Outcome:  Patient Visit Completed  Cayetano Coco [facility], [facility] Health Care Management Assistant Direct Dial: [phone]  Fax: [phone]

## 2023-11-15 MED ORDER — TRULICITY 1.5 MG/0.5ML ~~LOC~~ SOAJ: 1.5000 mg | SUBCUTANEOUS | 1 refills | Status: DC

## 2023-11-15 NOTE — Addendum Note (Signed)
 Addended by: Jolanda Nation on: 11/15/2023 08:48 AM   Modules accepted: Orders

## 2023-11-15 NOTE — Telephone Encounter (Signed)
 Receive [facility] letter requesting pt application,pt has not mail back once she mail back we can fax it to [employer].

## 2023-11-24 NOTE — Telephone Encounter (Signed)
 Gave pt a call to follow up on pt application for [employer],spoke with pt explain she has received application and will mail back as soon as she fills it out today or tomorrow.

## 2023-11-26 NOTE — Telephone Encounter (Signed)
 Received pt portion today waiting on provider portion to be fax to company.

## 2023-12-01 NOTE — Telephone Encounter (Signed)
 Faxing provider portion of [employer] application today and will follow up in a few days.

## 2023-12-02 ENCOUNTER — Other Ambulatory Visit: Payer: Self-pay | Admitting: General Practice

## 2023-12-02 NOTE — Telephone Encounter (Signed)
 Received provider portion today on [employer] ,faxed it to [employer] and will follow up in a couple of days.

## 2023-12-09 NOTE — Telephone Encounter (Signed)
 [employer] a call to follow up on pt's application. Spoke with a representative said they still have not received pt portion,they have only received provider portion, Faxing pt's portion today to follow in a  few days per representative.

## 2023-12-10 ENCOUNTER — Ambulatory Visit (INDEPENDENT_AMBULATORY_CARE_PROVIDER_SITE_OTHER): Admitting: General Practice

## 2023-12-10 ENCOUNTER — Encounter: Payer: Self-pay | Admitting: General Practice

## 2023-12-10 VITALS — BP 116/78 | HR 58 | Temp 97.6°F | Ht 66.5 in | Wt 156.0 lb

## 2023-12-10 DIAGNOSIS — E559 Vitamin D deficiency, unspecified: Secondary | ICD-10-CM | POA: Insufficient documentation

## 2023-12-10 DIAGNOSIS — Z1159 Encounter for screening for other viral diseases: Secondary | ICD-10-CM | POA: Diagnosis not present

## 2023-12-10 DIAGNOSIS — E1165 Type 2 diabetes mellitus with hyperglycemia: Secondary | ICD-10-CM

## 2023-12-10 DIAGNOSIS — I10 Essential (primary) hypertension: Secondary | ICD-10-CM

## 2023-12-10 DIAGNOSIS — E782 Mixed hyperlipidemia: Secondary | ICD-10-CM | POA: Diagnosis not present

## 2023-12-10 DIAGNOSIS — M7989 Other specified soft tissue disorders: Secondary | ICD-10-CM

## 2023-12-10 DIAGNOSIS — Z7984 Long term (current) use of oral hypoglycemic drugs: Secondary | ICD-10-CM

## 2023-12-10 LAB — LIPID PANEL
Cholesterol: 129 mg/dL (ref 0–200)
HDL: 45.1 mg/dL (ref >39.00)
LDL Cholesterol: 49 mg/dL (ref 0–99)
NonHDL: 83.85
Total CHOL/HDL Ratio: 3
Triglycerides: 176 mg/dL — ABNORMAL HIGH (ref 0.0–149.0)
VLDL: 35.2 mg/dL (ref 0.0–40.0)

## 2023-12-10 LAB — VITAMIN D 25 HYDROXY (VIT D DEFICIENCY, FRACTURES): VITD: 66.04 ng/mL (ref 30.00–100.00)

## 2023-12-10 LAB — TSH: TSH: 1.47 u[IU]/mL (ref 0.35–5.50)

## 2023-12-10 NOTE — Assessment & Plan Note (Signed)
 Chronic and controlled. Repeat vitamin D  level pending.

## 2023-12-10 NOTE — Progress Notes (Signed)
 Established Patient Office Visit  Subjective   Patient ID: Jaskirat Zertuche, female    DOB: 10-01-1945  Age: 78 y.o. MRN: [id number]  Chief Complaint  Patient presents with   Diabetes    Has not started trulicity  yet.     Diabetes Pertinent negatives for hypoglycemia include no dizziness, headaches or nervousness/anxiousness. Pertinent negatives for diabetes include no chest pain and no polydipsia.    Adanna Zuckerman is a 78 year old female with HTN, atherosclerosis of aorta, PAD, lung nodule, RA, osteopenia, hepatic cirrhosis, GERD, HLD presents today.   Type 2 DM-currently managed on metformin  1000 mg twice daily.  She was supposed to start Trulicity  1.5 mg once weekly however she has been in communications with prescription assistance program and has not received her Trulicity  yet her A1c was 10.4 on 11/09/2023.  Urine ACR was within normal limits kidney functions are within normal limits.  She has been making lifestyle modifications by making better food choices.  She denies any numbness, tingling, polyuria, polyphagia or polydipsia.  Hyperlipidemia: Currently managed on rosuvastatin  20 mg once daily.  Denies any concerns.  She is due to have her lipid panel drawn today.  Vitamin D  deficiency: Currently managed on vitamin D  50,000 units once weekly.  She has been taking that for many years.  Denies any concerns today.  Due for repeat labs.  Hypertension: Currently managed on olmesartan 40 mg once daily, metoprolol  100 mg once daily.  She was also taking amlodipine  however it was stopped to see if her ankle edema would improve.  She still has ankle edema after taking a break.  She has not been checking her blood pressures at home however declines any blurred vision, headaches, chest pain or shortness of breath.  Depression: Her sister passed away a few weeks ago. Feeling slightly depressed. However, she declines therapy at this time.  Denies SI/HI.   Patient Active Problem List   Diagnosis  Date Noted   Vitamin D  deficiency 12/10/2023   Establishing care with new doctor, encounter for 11/09/2023   Leg swelling 11/09/2023   Anaphylaxis 08/03/2023   History of food allergy 08/03/2023   Uncontrolled type 2 diabetes mellitus with hyperglycemia, without long-term current use of insulin  (HCC) 08/03/2023   Sepsis (HCC) 02/23/2019   CAD in native artery 02/03/2018   Mixed hyperlipidemia 12/01/2016   GERD (gastroesophageal reflux disease) 12/19/2014   Stented coronary artery    Pain in the chest    Acute coronary syndrome [facility])    PAD (peripheral artery disease) (HCC) 04/27/2014   Chest pain 03/15/2014   Elevated coronary artery calcium  score 03/15/2014   Lung nodule 05/11/2013   Osteopenia 05/11/2013   Atherosclerosis of aorta (HCC) 05/11/2013   Hepatic cirrhosis (HCC) 05/11/2013   Thrombocytopenia, unspecified (HCC) 05/11/2013   Benign neoplasm of colon 05/11/2013   Lower GI bleed 05/09/2013   Essential hypertension, benign 05/09/2013   Acute lower GI bleeding 05/09/2013   Chronic cholecystitis 02/18/2012   Rheumatoid arthritis (HCC) 01/07/2012   Headache 10/01/2011    Class: Acute   Nausea and vomiting 10/01/2011    Class: Acute   Hypertension 10/01/2011    Class: Acute   Past Medical History:  Diagnosis Date   Arthritis    DM type 2 (diabetes mellitus, type 2) (HCC)    Fatty liver disease, nonalcoholic    Hypertension    Migraines    when I was very young; before 1968   Neuromuscular disorder (HCC)    NSTEMI (non-ST  elevated myocardial infarction) (HCC)    Rheumatoid arthritis(714.0)    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   w/transient inability to walk   Past Surgical History:  Procedure Laterality Date   BREAST LUMPECTOMY  1970   right   COLONOSCOPY N/A 05/11/2013   Procedure: COLONOSCOPY;  Surgeon: Gwendlyn ONEIDA Buddy, MD;  Location: [facility] ENDOSCOPY;  Service: Endoscopy;  Laterality: N/A;   CORONARY ANGIOPLASTY WITH STENT PLACEMENT  10/09/14   Xience DES  to RCA   FRACTURE SURGERY     plates and screws in right ankle   LEFT HEART CATHETERIZATION WITH CORONARY ANGIOGRAM N/A 10/09/2014   Procedure: LEFT HEART CATHETERIZATION WITH CORONARY ANGIOGRAM;  Surgeon: Alm LELON Clay, MD;  Location: [facility] CATH LAB;  Service: Cardiovascular;  Laterality: N/A;   ORIF ANKLE FRACTURE  09/2008   right   TUBAL LIGATION     VAGINAL HYSTERECTOMY  1982   Allergies  Allergen Reactions   Demerol Other (See Comments)    vomiting   Peanut-Containing Drug Products     Mouth swelling   Atorvastatin  Calcium  Other (See Comments)   Empagliflozin     Other Reaction(s): recurrent yeast infection         12/10/2023   10:55 AM 11/09/2023   10:15 AM  Depression screen PHQ 2/9  Decreased Interest 1 0  Down, Depressed, Hopeless 1 0  PHQ - 2 Score 2 0  Altered sleeping 1 0  Tired, decreased energy 0 1  Change in appetite 1 0  Feeling bad or failure about yourself  0 0  Trouble concentrating 0 0  Moving slowly or fidgety/restless 0 0  Suicidal thoughts 0 0  PHQ-9 Score 4 1  Difficult doing work/chores Not difficult at all Not difficult at all       12/10/2023   10:55 AM 11/09/2023   10:15 AM  GAD 7 : Generalized Anxiety Score  Nervous, Anxious, on Edge 0 0  Control/stop worrying 0 0  Worry too much - different things 1 0  Trouble relaxing 1 0  Restless 0 0  Easily annoyed or irritable 0 1  Afraid - awful might happen 1 0  Total GAD 7 Score 3 1  Anxiety Difficulty Not difficult at all Not difficult at all      Review of Systems  Constitutional:  Negative for chills and fever.  Respiratory:  Negative for shortness of breath.   Cardiovascular:  Negative for chest pain.  Gastrointestinal:  Negative for abdominal pain, constipation, diarrhea, heartburn, nausea and vomiting.  Genitourinary:  Negative for dysuria, frequency and urgency.  Neurological:  Negative for dizziness and headaches.  Endo/Heme/Allergies:  Negative for polydipsia.   Psychiatric/Behavioral:  Negative for depression and suicidal ideas. The patient is not nervous/anxious.       Objective:     BP 116/78   Pulse (!) 58   Temp 97.6 F (36.4 C) (Temporal)   Ht 5' 6.5 (1.689 m)   Wt 156 lb (70.8 kg)   SpO2 98%   BMI 24.80 kg/m  BP Readings from Last 3 Encounters:  12/10/23 116/78  11/09/23 124/80  08/03/23 120/73   Wt Readings from Last 3 Encounters:  12/10/23 156 lb (70.8 kg)  11/09/23 155 lb (70.3 kg)  08/02/23 162 lb (73.5 kg)      Physical Exam Vitals and nursing note reviewed.  Constitutional:      Appearance: Normal appearance.   Cardiovascular:     Rate and Rhythm: Normal rate and regular rhythm.  Pulses: Normal pulses.     Heart sounds: Normal heart sounds.  Pulmonary:     Effort: Pulmonary effort is normal.     Breath sounds: Normal breath sounds.   Neurological:     Mental Status: She is alert and oriented to person, place, and time.   Psychiatric:        Mood and Affect: Mood normal.        Behavior: Behavior normal.        Thought Content: Thought content normal.        Judgment: Judgment normal.      No results found for any visits on 12/10/23.     The ASCVD Risk score (Arnett DK, et al., 2019) failed to calculate for the following reasons:   Risk score cannot be calculated because patient has a medical history suggesting prior/existing ASCVD    Assessment & Plan:  Uncontrolled type 2 diabetes mellitus with hyperglycemia, without long-term current use of insulin  (HCC) Assessment & Plan: Uncontrolled. Hemoglobin A1c 10.4 on Nov 09, 2023. She is waiting to receive Trulicity  from the manufacturer.  Continue metformin  1000 mg twice daily.  Start Trulicity  1.5 mg once weekly.  Follow-up in 3 months. Foot exam completed today.  Urine ACR up-to-date.  Pneumonia vaccine up-to-date.  She is scheduled for the eye exam.   Orders: -     TSH  Vitamin D  deficiency Assessment & Plan: Chronic and  controlled. Repeat vitamin D  level pending.  Orders: -     VITAMIN D  25 Hydroxy (Vit-D Deficiency, Fractures)  Mixed hyperlipidemia Assessment & Plan: Repeat lipid panel pending. Continue rosuvastatin  20 mg once daily.  Orders: -     Lipid panel -     TSH  Need for hepatitis C screening test -     Hepatitis C antibody  Essential hypertension, benign Assessment & Plan: Controlled.   Continue Metoprolol  100 mg once daily and Olmesartan 40 mg once daily.   Discussed continue checking blood pressures at home and monitoring ankle swelling.  May consider starting amlodipine  if her blood pressures increase. Will continue to monitor. Follow-up in 3 months.   Leg swelling Assessment & Plan: Discussed using compression stockings and elevating legs while sitting. Will continue to monitor.     Return in about 3 months (around 03/11/2024) for chronic care management.    Carrol Aurora, NP

## 2023-12-10 NOTE — Patient Instructions (Addendum)
 Stop by the lab prior to leaving today. I will notify you of your results once received.   Please have your eye exam records faxed to me.   Vaccines at the pharmacy -shingles vaccine -tdap  Follow up in 3 months. Please update me if you do not receive your Trulicity .   It was a pleasure to see you today!

## 2023-12-10 NOTE — Assessment & Plan Note (Signed)
 Uncontrolled. Hemoglobin A1c 10.4 on Nov 09, 2023. She is waiting to receive Trulicity  from the manufacturer.  Continue metformin  1000 mg twice daily.  Start Trulicity  1.5 mg once weekly.  Follow-up in 3 months. Foot exam completed today.  Urine ACR up-to-date.  Pneumonia vaccine up-to-date.  She is scheduled for the eye exam.

## 2023-12-10 NOTE — Assessment & Plan Note (Signed)
 Discussed using compression stockings and elevating legs while sitting. Will continue to monitor.

## 2023-12-10 NOTE — Telephone Encounter (Signed)
 Received a fax from [employer] pt has been APPROVED on Trulicity  until the ned of the year, pt is aware and knows she will be receiving it soon.

## 2023-12-10 NOTE — Assessment & Plan Note (Signed)
 Repeat lipid panel pending. Continue rosuvastatin  20 mg once daily.

## 2023-12-10 NOTE — Assessment & Plan Note (Addendum)
 Controlled.   Continue Metoprolol  100 mg once daily and Olmesartan 40 mg once daily.   Discussed continue checking blood pressures at home and monitoring ankle swelling.  May consider starting amlodipine  if her blood pressures increase. Will continue to monitor. Follow-up in 3 months.

## 2023-12-11 LAB — HEPATITIS C ANTIBODY: Hepatitis C Ab: NONREACTIVE

## 2023-12-13 ENCOUNTER — Ambulatory Visit: Payer: Self-pay | Admitting: General Practice

## 2023-12-20 ENCOUNTER — Telehealth: Payer: Self-pay

## 2023-12-20 NOTE — Telephone Encounter (Signed)
 Copied from CRM [id number]. Topic: General - Other >> Dec 20, 2023 11:01 AM Robinson H wrote: Reason for CRM: Patient calling to inform Harlene of the name of her Onetouch test strips are One touch Verio, and inform provider that she still hasn't received the Dulaglutide  (TRULICITY ) 1.5 MG/0.5ML [facility] Service [phone]

## 2023-12-21 ENCOUNTER — Other Ambulatory Visit: Payer: Self-pay | Admitting: Pharmacist

## 2023-12-21 ENCOUNTER — Ambulatory Visit (INDEPENDENT_AMBULATORY_CARE_PROVIDER_SITE_OTHER)

## 2023-12-21 VITALS — BP 116/78 | Ht 66.5 in | Wt 155.0 lb

## 2023-12-21 DIAGNOSIS — Z0000 Encounter for general adult medical examination without abnormal findings: Secondary | ICD-10-CM | POA: Diagnosis not present

## 2023-12-21 DIAGNOSIS — Z5320 Procedure and treatment not carried out because of patient's decision for unspecified reasons: Secondary | ICD-10-CM

## 2023-12-21 DIAGNOSIS — Z2821 Immunization not carried out because of patient refusal: Secondary | ICD-10-CM | POA: Diagnosis not present

## 2023-12-21 MED ORDER — ONETOUCH VERIO VI STRP: ORAL_STRIP | 3 refills | Status: DC

## 2023-12-21 NOTE — Progress Notes (Signed)
 Medication Question Reason for Call: Patient reports she has not received Trulicity  yet from [employer]  Summary: Re-enrolled in [place] [facility] via [facility] access team.  Documentation of program approval on 12/10/23.  Called [facility] pharmacy.  They report medication will be delivered within 5-7 business days.  Called patient to inform her of expected delivery date. Verbalized understanding, was appreciative of phone call.   Follow Up: Patient given direct line for further questions/concerns.   Hayley Hogan, PharmD Clinical Pharmacist [facility] [phone]

## 2023-12-21 NOTE — Addendum Note (Signed)
 Addended by: TENNIE RAISIN B on: 12/21/2023 02:19 PM   Modules accepted: Orders

## 2023-12-21 NOTE — Progress Notes (Signed)
 Because this visit was a virtual/telehealth visit,  certain criteria was not obtained, such a blood pressure, CBG if applicable, and timed get up and go. Any medications not marked as taking were not mentioned during the medication reconciliation part of the visit. Any vitals not documented were not able to be obtained due to this being a telehealth visit or patient was unable to self-report a recent blood pressure reading due to a lack of equipment at home via telehealth. Vitals that have been documented are verbally provided by the patient.   This visit was performed by a medical professional under my direct supervision. I was immediately available for consultation/collaboration. I have reviewed and agree with the Annual Wellness Visit documentation.  Subjective:   Hayley Hogan is a 78 y.o. who presents for a Medicare Wellness preventive visit.  As a reminder, Annual Wellness Visits don't include a physical exam, and some assessments may be limited, especially if this visit is performed virtually. We may recommend an in-person follow-up visit with your provider if needed.  Visit Complete: Virtual I connected with  [name] on 12/21/23 by a audio enabled telemedicine application and verified that I am speaking with the correct person using two identifiers.  Patient Location: Home  Provider Location: Home Office  I discussed the limitations of evaluation and management by telemedicine. The patient expressed understanding and agreed to proceed.  Vital Signs: Because this visit was a virtual/telehealth visit, some criteria may be missing or patient reported. Any vitals not documented were not able to be obtained and vitals that have been documented are patient reported.  VideoDeclined- This patient declined Librarian, academic. Therefore the visit was completed with audio only.  Persons Participating in Visit: Patient.  AWV Questionnaire: No: Patient Medicare  AWV questionnaire was not completed prior to this visit.  Cardiac Risk Factors include: advanced age (>59men, >59 women);diabetes mellitus;hypertension     Objective:    Today's Vitals   12/21/23 1112 12/21/23 1113  BP: 116/78   Weight: 155 lb (70.3 kg)   Height: 5' 6.5 (1.689 m)   PainSc:  5    Body mass index is 24.64 kg/m.     12/21/2023   11:11 AM 08/03/2023    8:30 AM 08/02/2023   11:22 PM 02/24/2019   12:31 AM 02/23/2019    5:21 PM 10/08/2014   10:16 PM 01/05/2014    8:24 AM  Advanced Directives  Does Patient Have a Medical Advance Directive? No Yes Yes No No Yes;No  Patient does not have advance directive;Patient would not like information   Type of Advance Directive  Healthcare Power of Attorney       Does patient want to make changes to medical advance directive?  No - Patient declined No - Patient declined      Copy of Healthcare Power of Attorney in Chart?  Yes - validated most recent copy scanned in chart (See row information)    No - copy requested    Would patient like information on creating a medical advance directive? No - Patient declined   No - Patient declined        Data saved with a previous flowsheet row definition    Current Medications (verified) Outpatient Encounter Medications as of 12/21/2023  Medication Sig   Dulaglutide  (TRULICITY ) 1.5 MG/0.5ML SOAJ Inject 1.5 mg into the skin once a week.   EPINEPHrine  0.3 mg/0.3 mL IJ SOAJ injection Inject 0.3 mg into the muscle as needed for anaphylaxis.  metFORMIN  (GLUCOPHAGE -XR) 500 MG 24 hr tablet Take 1,000 mg by mouth 2 (two) times daily.    metoprolol  (LOPRESSOR ) 100 MG tablet Take 100 mg by mouth 2 (two) times daily.   nitroGLYCERIN  (NITROSTAT ) 0.4 MG SL tablet Place 1 tablet (0.4 mg total) under the tongue every 5 (five) minutes as needed for chest pain.   olmesartan (BENICAR) 40 MG tablet Take 40 mg by mouth daily.   omeprazole (PRILOSEC) 20 MG capsule Take 20 mg by mouth daily.   predniSONE  (DELTASONE )  5 MG tablet Take 5 mg by mouth daily with breakfast.   rosuvastatin  (CRESTOR ) 20 MG tablet Take 20 mg by mouth daily.   traMADol  (ULTRAM ) 50 MG tablet Take 50 mg by mouth every 6 (six) hours as needed.   Vitamin D , Ergocalciferol , (DRISDOL ) 50000 UNITS CAPS capsule Take 50,000 Units by mouth every 7 (seven) days. Wednesday   No facility-administered encounter medications on file as of 12/21/2023.    Allergies (verified) Demerol, Peanut-containing drug products, Atorvastatin  calcium , and Empagliflozin   History: Past Medical History:  Diagnosis Date   Arthritis    DM type 2 (diabetes mellitus, type 2) (HCC)    Fatty liver disease, nonalcoholic    Hypertension    Migraines    when I was very young; before 1968   Neuromuscular disorder (HCC)    NSTEMI (non-ST elevated myocardial infarction) (HCC)    Rheumatoid arthritis(714.0)    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   w/transient inability to walk   Past Surgical History:  Procedure Laterality Date   BREAST LUMPECTOMY  1970   right   COLONOSCOPY N/A 05/11/2013   Procedure: COLONOSCOPY;  Surgeon: Gwendlyn ONEIDA Buddy, MD;  Location: [facility] ENDOSCOPY;  Service: Endoscopy;  Laterality: N/A;   CORONARY ANGIOPLASTY WITH STENT PLACEMENT  10/09/14   Xience DES to RCA   FRACTURE SURGERY     plates and screws in right ankle   LEFT HEART CATHETERIZATION WITH CORONARY ANGIOGRAM N/A 10/09/2014   Procedure: LEFT HEART CATHETERIZATION WITH CORONARY ANGIOGRAM;  Surgeon: Alm LELON Clay, MD;  Location: [facility] CATH LAB;  Service: Cardiovascular;  Laterality: N/A;   ORIF ANKLE FRACTURE  09/2008   right   TUBAL LIGATION     VAGINAL HYSTERECTOMY  1982   Family History  Problem Relation Age of Onset   Hypertension Mother    Cancer Father    Heart Problems Maternal Grandfather    Cancer Brother    Cancer Brother    Other Brother        GSW   Intellectual disability Brother    Stroke Sister        x2   Diabetes Sister    Hypertension Sister        x2    COPD Sister    Diabetes Sister    Stroke Sister    Social History   Socioeconomic History   Marital status: Married    Spouse name: Not on file   Number of children: Not on file   Years of education: Not on file   Highest education level: Associate degree: occupational, Scientist, product/process development, or vocational program  Occupational History   Not on file  Tobacco Use   Smoking status: Former    Current packs/day: 0.00    Average packs/day: 0.6 packs/day for 73.0 years (45.0 ttl pk-yrs)    Types: Cigarettes    Start date: 10/08/1979    Quit date: 06/14/2016    Years since quitting: 7.5   Smokeless tobacco: Never  Tobacco comments:    Stopped for 6 months in 2016 and for good 06/14/2016  Substance and Sexual Activity   Alcohol use: Yes    Alcohol/week: 1.0 standard drink of alcohol    Types: 1 Glasses of wine per week    Comment: Drink wine occasionally   Drug use: Yes    Types: Marijuana    Comment: marijuana in the 1960's   Sexual activity: Not Currently    Birth control/protection: Post-menopausal  Other Topics Concern   Not on file  Social History Narrative   Not on file   Social Drivers of Health   Financial Resource Strain: Low Risk  (12/21/2023)   Overall Financial Resource Strain (CARDIA)    Difficulty of Paying Living Expenses: Not very hard  Food Insecurity: No Food Insecurity (12/21/2023)   Hunger Vital Sign    Worried About Running Out of Food in the Last Year: Never true    Ran Out of Food in the Last Year: Never true  Transportation Needs: No Transportation Needs (12/21/2023)   PRAPARE - Administrator, Civil Service (Medical): No    Lack of Transportation (Non-Medical): No  Physical Activity: Insufficiently Active (12/21/2023)   Exercise Vital Sign    Days of Exercise per Week: 2 days    Minutes of Exercise per Session: 20 min  Stress: Stress Concern Present (12/21/2023)   [employer] of Occupational Health - Occupational Stress Questionnaire     Feeling of Stress: To some extent  Social Connections: Moderately Isolated (12/21/2023)   Social Connection and Isolation Panel    Frequency of Communication with Friends and Family: Twice a week    Frequency of Social Gatherings with Friends and Family: Once a week    Attends Religious Services: Never    Database administrator or Organizations: No    Attends Banker Meetings: Never    Marital Status: Married    Tobacco Counseling Counseling given: Not Answered Tobacco comments: Stopped for 6 months in 2016 and for good 06/14/2016    Clinical Intake:  Pre-visit preparation completed: Yes  Pain : 0-10 Pain Score: 5  Pain Type: Chronic pain Pain Location: Shoulder Pain Orientation: Right, Left Pain Descriptors / Indicators: Aching Pain Onset: Today Pain Frequency: Constant     BMI - recorded: 24.64 Nutritional Risks: None Diabetes: Yes CBG done?: No Did pt. bring in CBG monitor from home?: No  Lab Results  Component Value Date   HGBA1C 10.7 (H) 11/09/2023   HGBA1C 8.2 (H) 08/03/2023   HGBA1C 8.3 (H) 02/24/2019     How often do you need to have someone help you when you read instructions, pamphlets, or other written materials from your doctor or pharmacy?: 1 - Never What is the last grade level you completed in school?: some college  Interpreter Needed?: No  Information entered by :: Belal Scallon,cma   Activities of Daily Living     12/21/2023   11:17 AM 08/03/2023    8:30 AM  In your present state of health, do you have any difficulty performing the following activities:  Hearing? 0 0  Vision? 0 0  Difficulty concentrating or making decisions? 0 0  Walking or climbing stairs? 0   Dressing or bathing? 0   Doing errands, shopping? 0 0  Preparing Food and eating ? N   Using the Toilet? N   In the past six months, have you accidently leaked urine? Y   Do you have problems with  loss of bowel control? N   Managing your Medications? N    Managing your Finances? N   Housekeeping or managing your Housekeeping? N     Patient Care Team: Vincente Shivers, NP as PCP - General (General Practice) Mona Vinie BROCKS, MD as PCP - Cardiology (Cardiology)  I have updated your Care Teams any recent Medical Services you may have received from other providers in the past year.     Assessment:   This is a routine wellness examination for Lashya.  Hearing/Vision screen Hearing Screening - Comments:: No difficulties hearing  Vision Screening - Comments:: Patient wears glasses    Goals Addressed             This Visit's Progress    Patient Stated       Patient would like to lower blood sugar       Depression Screen     12/21/2023   11:19 AM 12/10/2023   10:55 AM 11/09/2023   10:15 AM  PHQ 2/9 Scores  PHQ - 2 Score 1 2 0  PHQ- 9 Score 3 4 1     Fall Risk     12/21/2023   11:15 AM 12/10/2023   10:54 AM 11/09/2023   10:15 AM  Fall Risk   Falls in the past year? 0 0 0  Number falls in past yr: 0 0 0  Injury with Fall? 0 0 0  Risk for fall due to : No Fall Risks No Fall Risks No Fall Risks  Follow up Falls evaluation completed Falls evaluation completed Falls evaluation completed    MEDICARE RISK AT HOME:  Medicare Risk at Home Any stairs in or around the home?: No If so, are there any without handrails?: No Home free of loose throw rugs in walkways, pet beds, electrical cords, etc?: Yes Adequate lighting in your home to reduce risk of falls?: Yes Life alert?: No Use of a cane, walker or w/c?: No Grab bars in the bathroom?: Yes Shower chair or bench in shower?: Yes Elevated toilet seat or a handicapped toilet?: Yes  TIMED UP AND GO:  Was the test performed?  No  Cognitive Function: 6CIT completed        12/21/2023   11:15 AM  6CIT Screen  What Year? 0 points  What month? 0 points  What time? 0 points  Count back from 20 0 points  Months in reverse 0 points  Repeat phrase 0 points  Total Score 0 points     Immunizations Immunization History  Administered Date(s) Administered   Influenza-Unspecified 02/18/2022   PFIZER(Purple Top)SARS-COV-2 Vaccination 08/08/2019, 08/29/2019, 03/12/2020   Pfizer Covid-19 Vaccine Bivalent Booster 13yrs & up 03/06/2021   Pfizer(Comirnaty)Fall Seasonal Vaccine 12 years and older 04/03/2022   Pneumococcal Conjugate-13 02/04/2015   Pneumococcal Polysaccharide-23 10/01/2011, 12/23/2011, 01/20/2013, 01/31/2014   Respiratory Syncytial Virus Vaccine,Recomb Aduvanted(Arexvy) 11/12/2022   Td (Adult),5 Lf Tetanus Toxid, Preservative Free 06/15/2008   Zoster, Live 01/31/2014    Screening Tests Health Maintenance  Topic Date Due   FOOT EXAM  Never done   OPHTHALMOLOGY EXAM  Never done   Zoster Vaccines- Shingrix (1 of 2) 03/06/1967   DTaP/Tdap/Td (1 - Tdap) 06/16/2008   Hepatitis B Vaccines (1 of 3 - Risk 3-dose series) 12/06/2024 (Originally 03/05/2008)   DEXA SCAN  12/09/2024 (Originally 03/05/2013)   Colonoscopy  12/09/2024 (Originally 05/12/2023)   COVID-19 Vaccine (6 - 2024-25 season) 11/25/2027 (Originally 02/14/2023)   INFLUENZA VACCINE  01/14/2024   HEMOGLOBIN A1C  05/11/2024  Diabetic kidney evaluation - eGFR measurement  11/08/2024   Diabetic kidney evaluation - Urine ACR  11/08/2024   Medicare Annual Wellness (AWV)  12/20/2024   Pneumococcal Vaccine: 50+ Years  Completed   Hepatitis C Screening  Completed   HPV VACCINES  Aged Out   Meningococcal B Vaccine  Aged Out   Lung Cancer Screening  Discontinued    Health Maintenance  Health Maintenance Due  Topic Date Due   FOOT EXAM  Never done   OPHTHALMOLOGY EXAM  Never done   Zoster Vaccines- Shingrix (1 of 2) 03/06/1967   DTaP/Tdap/Td (1 - Tdap) 06/16/2008   Health Maintenance Items Addressed:   Additional Screening:  Vision Screening: Recommended annual ophthalmology exams for early detection of glaucoma and other disorders of the eye. Would you like a referral to an eye doctor? No     Dental Screening: Recommended annual dental exams for proper oral hygiene  Community Resource Referral / Chronic Care Management: CRR required this visit?  No   CCM required this visit?  No   Plan:    I have personally reviewed and noted the following in the patient's chart:   Medical and social history Use of alcohol, tobacco or illicit drugs  Current medications and supplements including opioid prescriptions. Patient is not currently taking opioid prescriptions. Functional ability and status Nutritional status Physical activity Advanced directives List of other physicians Hospitalizations, surgeries, and ER visits in previous 12 months Vitals Screenings to include cognitive, depression, and falls Referrals and appointments  In addition, I have reviewed and discussed with patient certain preventive protocols, quality metrics, and best practice recommendations. A written personalized care plan for preventive services as well as general preventive health recommendations were provided to patient.   Lyle MARLA Right, [place]   12/21/2023   After Visit Summary: (MyChart) Due to this being a telephonic visit, the after visit summary with patients personalized plan was offered to patient via MyChart   Notes: Nothing significant to report at this time.

## 2023-12-21 NOTE — Patient Instructions (Signed)
 Hayley Hogan , Thank you for taking time out of your busy schedule to complete your Annual Wellness Visit with me. I enjoyed our conversation and look forward to speaking with you again next year. I, as well as your care team,  appreciate your ongoing commitment to your health goals. Please review the following plan we discussed and let me know if I can assist you in the future. Your Game plan/ To Do List    Referrals: If you haven't heard from the office you've been referred to, please reach out to them at the phone provided.  none Follow up Visits: Next Medicare AWV with our clinical staff:    Have you seen your provider in the last 6 months (3 months if uncontrolled diabetes)? No Next Office Visit with your provider: 03/13/2024  Clinician Recommendations:  Aim for 30 minutes of exercise or brisk walking, 6-8 glasses of water, and 5 servings of fruits and vegetables each day.       This is a list of the screening recommended for you and due dates:  Health Maintenance  Topic Date Due   Complete foot exam   Never done   Eye exam for diabetics  Never done   Zoster (Shingles) Vaccine (1 of 2) 03/06/1967   DTaP/Tdap/Td vaccine (1 - Tdap) 06/16/2008   Hepatitis B Vaccine (1 of 3 - Risk 3-dose series) 12/06/2024*   DEXA scan (bone density measurement)  12/09/2024*   Colon Cancer Screening  12/09/2024*   COVID-19 Vaccine (6 - 2024-25 season) 11/25/2027*   Flu Shot  01/14/2024   Hemoglobin A1C  05/11/2024   Yearly kidney function blood test for diabetes  11/08/2024   Yearly kidney health urinalysis for diabetes  11/08/2024   Medicare Annual Wellness Visit  12/20/2024   Pneumococcal Vaccine for age over 60  Completed   Hepatitis C Screening  Completed   HPV Vaccine  Aged Out   Meningitis B Vaccine  Aged Out   Screening for Lung Cancer  Discontinued  *Topic was postponed. The date shown is not the original due date.    Advanced directives: (Declined) Advance directive discussed with you  today. Even though you declined this today, please call our office should you change your mind, and we can give you the proper paperwork for you to fill out. Advance Care Planning is important because it:  [x]  Makes sure you receive the medical care that is consistent with your values, goals, and preferences  [x]  It provides guidance to your family and loved ones and reduces their decisional burden about whether or not they are making the right decisions based on your wishes.  Follow the link provided in your after visit summary or read over the paperwork we have mailed to you to help you started getting your Advance Directives in place. If you need assistance in completing these, please reach out to us  so that we can help you!  See attachments for Preventive Care and Fall Prevention Tips.

## 2023-12-21 NOTE — Telephone Encounter (Signed)
 Spoke with patient and strips have been sent to her pharmacy. Patient stated that she still has not gotten the trulicity  sent to her yet. Can we check on the status of this?

## 2024-01-26 LAB — HM DIABETES EYE EXAM

## 2024-01-28 ENCOUNTER — Other Ambulatory Visit (HOSPITAL_COMMUNITY): Payer: Self-pay

## 2024-02-07 ENCOUNTER — Other Ambulatory Visit: Payer: Self-pay | Admitting: General Practice

## 2024-02-07 MED ORDER — OMEPRAZOLE 20 MG PO CPDR: 20.0000 mg | DELAYED_RELEASE_CAPSULE | Freq: Every day | ORAL | 2 refills | Status: AC

## 2024-02-07 NOTE — Telephone Encounter (Unsigned)
 Copied from CRM [id number]. Topic: General - Other >> Feb 07, 2024  1:14 PM Gennette ORN wrote: Reason for CRM: Patient is calling in because she is completely out of medication by the name of predniSONE  (DELTASONE ) 5 MG tablet. Her appointment isn't until 03/13/24. Please follow up with patient.

## 2024-02-07 NOTE — Telephone Encounter (Signed)
 Called Patient states that she was getting from past pcp. Needs refill now she will be out today

## 2024-02-21 ENCOUNTER — Ambulatory Visit: Attending: Internal Medicine | Admitting: Internal Medicine

## 2024-02-21 ENCOUNTER — Encounter: Payer: Self-pay | Admitting: Internal Medicine

## 2024-02-21 VITALS — BP 136/86 | HR 76 | Ht 67.0 in | Wt 152.8 lb

## 2024-02-21 DIAGNOSIS — I739 Peripheral vascular disease, unspecified: Secondary | ICD-10-CM | POA: Diagnosis not present

## 2024-02-21 DIAGNOSIS — I2510 Atherosclerotic heart disease of native coronary artery without angina pectoris: Secondary | ICD-10-CM | POA: Diagnosis not present

## 2024-02-21 DIAGNOSIS — I10 Essential (primary) hypertension: Secondary | ICD-10-CM

## 2024-02-21 DIAGNOSIS — E785 Hyperlipidemia, unspecified: Secondary | ICD-10-CM

## 2024-02-21 DIAGNOSIS — E118 Type 2 diabetes mellitus with unspecified complications: Secondary | ICD-10-CM

## 2024-02-21 NOTE — Patient Instructions (Signed)
 Medication Instructions:  NO CHANGES  *If you need a refill on your cardiac medications before your next appointment, please call your pharmacy*  Follow-Up: At [facility], you and your health needs are our priority.  As part of our continuing mission to provide you with exceptional heart care, our providers are all part of one team.  This team includes your primary Cardiologist (physician) and Advanced Practice Providers or APPs (Physician Assistants and Nurse Practitioners) who all work together to provide you with the care you need, when you need it.  Your next appointment:    6 months with Dr. Mona or PA/NP CALL in November for an appointment  We recommend signing up for the patient portal called MyChart.  Sign up information is provided on this After Visit Summary.  MyChart is used to connect with patients for Virtual Visits (Telemedicine).  Patients are able to view lab/test results, encounter notes, upcoming appointments, etc.  Non-urgent messages can be sent to your provider as well.   To learn more about what you can do with MyChart, go to [email or web address].

## 2024-02-21 NOTE — Progress Notes (Signed)
 OFFICE NOTE  Chief Complaint:  No complaints  Primary Care Physician: Vincente Shivers, NP  HPI:  Hayley Hogan is a pleasant 78 year old female he referred to me by Dr. Jakie. Her past history significant for well-controlled diabetes, rheumatoid arthritis, dyslipidemia and hypertension. She's also been a long-time smoker. Last year she underwent a CT scan in the setting of unexplained bleeding. This demonstrated coronary artery calcium , particularly in the right coronary artery which was significant. Recently she's been having some chest discomfort. She feels like she has a tightness in the center of her chest. The symptoms do not radiate and are sometimes associated with exertion and relieved by rest. Occasionally she feels like she needs to belch and that does relieve her symptoms. She's also reported some shortness of breath.  Hayley Hogan returns today for follow-up. Her stress test was negative for ischemia. She does have coronary artery calcium  as well as calcified atherosclerosis of the distal abdominal aorta and iliac bifurcation. There is disease distending into the iliofemoral arteries bilaterally. She denies any claudication symptoms. She reports since her last visit her chest pain symptoms have improved. She thinks is mainly just been related to stress and he continued workup she had to undergo after her abdominal CT demonstrated a number of abnormalities.  Has a pleasure seeing Hayley Hogan back in the office today. When I last saw her she underwent stress testing which was negative for ischemia. I reassured her although did feel that she has coronary disease and that she needed risk modification. She also needed to work on tobacco cessation. Unfortunately several months later she presented with unstable angina and underwent cardiac catheterization. This demonstrated a severe lesion in the right coronary artery for which she received a Stent: Xience Alpine DES 3.5  mm x 38  mm.   since that time, she's had no further chest pain, but she did have some shortness of breath. Recently she saw Leita Lobstein, FN-P, for hospital follow-up. An echocardiogram was ordered which showed normal systolic function. It was thought that maybe her shortness of breath was related to Brilinta . She was also describing palpitations and wore a monitor which I evaluated and demonstrated no evidence of tachyarrhythmias or extrasystoles. That seems to now have subsided. Fortunately she stop smoking and is been off cigarettes now for 3 months. Finally she is complaining of some acid indigestion, which he thinks may be related to her blood thinners.  At the pleasure seeing Hayley Hogan back today in the office. She reports that she is doing fairly well although she's had some negative chest discomfort. She said it started last summer and happens infrequently, but seems to be more associated with activity. In addition she has some left arm discomfort. She continues to have a low level of shortness of breath which is improved somewhat. She thinks it might be related to Brilinta . We discussed this previously and she notes that on days when she takes her morning dose of Brilinta  later and then takes her normal evening dose, she feels more short of breath. Of note, her coronary intervention was on 10/09/2014, therefore she would require to remain on Brilinta  only until 10/09/2015, which is less than one month from now. Today she reported some discomfort in her chest and in EKG shows anterolateral T-wave inversions in lead V2 through V6 which are new compared to her prior EKG. Of note, her previous dyspepsia improved with an H2 blocker but then reoccurred. Her primary care provider put her on  a PPI and this is now resolved her symptoms completely.  10/29/2015  Hayley Hogan returns today for follow-up. She underwent a nuclear stress test which demonstrated no ischemia and a normal LVEF of 70%. She reports her symptoms have  resolved and not recurred. At this point she does not feel that it was a cardiac chest point. I think it is also unlikely based on her recent stent to the fact she was on dual antiplatelet therapy. We did discuss that it's been more than a year since her stent was placed and she is now a candidate for discontinuing Brilinta .  12/01/2016  Hayley Hogan was seen in follow-up today. Her only concern is some chronic left leg swelling. 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 She had an episode of chest discomfort around Thanksgiving but she said it resolved after some BC powder. She's not had any recurrence since then. Cholesterol is been well controlled with LDL-C of 64 one year ago. She is due for repeat check today. Blood pressure is well controlled 120/84. EKG which is personally reviewed shows sinus bradycardia and anterolateral T-wave inversions which are unchanged from prior study. She recently stopped methotrexate  and plaque were no after her rheumatologist Dr. Everlean retired. She says that there has been some debate about whether or not she actually had rheumatoid arthritis or not she says that the side effects of her medications were more intolerable than any benefit she thought she got from them.  02/03/2018  Hayley Hogan was seen today in follow-up.  Overall she seems to be doing well.  She denies any chest pain or worsening shortness of breath.  She occasionally gets flares of her RA.  She is not on any DMARDs at this point because she had significant side effects.  She only uses a prednisone  as needed.  She understands that this could lead to irreversible joint destruction however she is without any significant pain at this point and felt that the side effects of her rheumatoid medications were worse than the pain of her RA.  She continues to have some chronic left leg swelling.  I have advised compression stockings to left lower extremity.  This will be  helpful to wear while she is driving.  She did undergo stress test this year for DOT purposes which was negative for ischemia.  She said this may be her last year of driving for work.  Labs reviewed from August 2018 showed total cholesterol 196, HDL 47, LDL 114 and triglycerides 823.  Hemoglobin A1c was elevated 8.  She is not yet at goal and will need a repeat lipid profile.  Her goal LDL is less than 70.  She is on moderate intensity statin and may need to be intensified.  05/04/2019  Hayley Hogan returns today for follow-up.  Unfortunately recently she was hospitalized at [facility] for acute sepsis.  This is likely secondary to left leg cellulitis which developed.  She has had problems with swelling in that leg.  This required ICU stay and IV antibiotics but ultimately she recovered.  Besides that she seems to be doing well.  She denies any chest pain or worsening shortness of breath.  Labs from recent office visit show total cholesterol 126, triglycerides 156, HDL 42 and LDL of 53.  EKG shows sinus rhythm.  Blood pressure is excellent today at 110/77.  07/18/2020  Hayley Hogan is seen today in follow-up.  Over the past year she has done fairly  well.  Today her only complaints are abdominal pain.  She had recent episodes of vomiting and diarrhea.  She also feels like she gets a burning sensation in her upper mid epigastrium.  Sometimes she feels like food gets stuck to the area and she has had some projectile vomiting.  She also has complained of some right upper quadrant tenderness.  She is concerned about whether this could be her gallbladder.  She denies any left-sided chest pain.  EKG today shows sinus rhythm at 75.  Blood pressure is good.  She has had no worsening shortness of breath with exertion.  12/14/2022  Hayley Hogan is seen today for follow-up. She saw Callie last year and was having some atypical sounding chest pain. She still has that. Not worse with exertion - worse when laying down. Very  infrequent shortness of breath. She recently saw her PCP. A1c was higher at 8.9% - she has been off the diet somewhat. Blood pressure has been well-controlled. LDL remains around 70.  02/21/2024  Hayley Hogan is seen today in follow-up.  In general she says she is feeling pretty well.  She has recently been having some headaches.  She says she is under a lot of stress taking care of her husband.  Cholesterol has been a little higher than normal and recently A1c was quite elevated at 10.7% in May.  She says she has follow-up with her primary care provider to continue to help with lowering her blood sugars.  Primarily she was restarted on Trulicity  recently.  Blood pressure is 136/86 today.  She says that she restarted amlodipine  because some of her high blood pressure readings at home may have correlated with her headaches.  She was previously on amlodipine  but this was discontinued due to swelling.  She is now taking 5 mg daily.  PMHx:  Past Medical History:  Diagnosis Date   Arthritis    DM type 2 (diabetes mellitus, type 2) (HCC)    Fatty liver disease, nonalcoholic    Hypertension    Migraines    when I was very young; before 1968   Neuromuscular disorder (HCC)    NSTEMI (non-ST elevated myocardial infarction) (HCC)    Rheumatoid arthritis(714.0)    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   w/transient inability to walk    Past Surgical History:  Procedure Laterality Date   BREAST LUMPECTOMY  1970   right   COLONOSCOPY N/A 05/11/2013   Procedure: COLONOSCOPY;  Surgeon: Gwendlyn ONEIDA Buddy, MD;  Location: [facility] ENDOSCOPY;  Service: Endoscopy;  Laterality: N/A;   CORONARY ANGIOPLASTY WITH STENT PLACEMENT  10/09/14   Xience DES to RCA   FRACTURE SURGERY     plates and screws in right ankle   LEFT HEART CATHETERIZATION WITH CORONARY ANGIOGRAM N/A 10/09/2014   Procedure: LEFT HEART CATHETERIZATION WITH CORONARY ANGIOGRAM;  Surgeon: Alm LELON Clay, MD;  Location: [facility] CATH LAB;  Service:  Cardiovascular;  Laterality: N/A;   ORIF ANKLE FRACTURE  09/2008   right   TUBAL LIGATION     VAGINAL HYSTERECTOMY  1982    FAMHx:  Family History  Problem Relation Age of Onset   Hypertension Mother    Cancer Father    Heart Problems Maternal Grandfather    Cancer Brother    Cancer Brother    Other Brother        GSW   Intellectual disability Brother    Stroke Sister        x2   Diabetes Sister  Hypertension Sister        x2   COPD Sister    Diabetes Sister    Stroke Sister     SOCHx:   reports that she quit smoking about 7 years ago. Her smoking use included cigarettes. She started smoking about 44 years ago. She has a 45 pack-year smoking history. She has never used smokeless tobacco. She reports current alcohol use of about 1.0 standard drink of alcohol per week. She reports current drug use. Drug: Marijuana.  ALLERGIES:  Allergies  Allergen Reactions   Demerol Other (See Comments)    vomiting   Peanut-Containing Drug Products     Mouth swelling   Atorvastatin  Calcium  Other (See Comments)   Empagliflozin     Other Reaction(s): recurrent yeast infection    ROS: Pertinent items noted in HPI and remainder of comprehensive ROS otherwise negative.  HOME MEDS: Current Outpatient Medications  Medication Sig Dispense Refill   aspirin  EC 81 MG tablet Take 81 mg by mouth daily. Swallow whole.     Dulaglutide  (TRULICITY ) 1.5 MG/0.5ML SOAJ Inject 1.5 mg into the skin once a week. 2 mL 1   EPINEPHrine  0.3 mg/0.3 mL IJ SOAJ injection Inject 0.3 mg into the muscle as needed for anaphylaxis. 2 each 0   folic acid  (FOLVITE ) 1 MG tablet Take 1 mg by mouth daily.     glucose blood (ONETOUCH VERIO) test strip Use to check blood sugar daily. Dx E 11.9 100 each 3   metFORMIN  (GLUCOPHAGE -XR) 500 MG 24 hr tablet Take 1,000 mg by mouth 2 (two) times daily.   2   metoprolol  (LOPRESSOR ) 100 MG tablet Take 100 mg by mouth 2 (two) times daily.     nitroGLYCERIN  (NITROSTAT ) 0.4 MG SL  tablet Place 1 tablet (0.4 mg total) under the tongue every 5 (five) minutes as needed for chest pain. 90 tablet 3   olmesartan (BENICAR) 40 MG tablet Take 40 mg by mouth daily.     omeprazole  (PRILOSEC) 20 MG capsule Take 1 capsule (20 mg total) by mouth daily. 30 capsule 2   predniSONE  (DELTASONE ) 5 MG tablet Take 5 mg by mouth daily with breakfast.     rosuvastatin  (CRESTOR ) 20 MG tablet Take 20 mg by mouth daily.     traMADol  (ULTRAM ) 50 MG tablet Take 50 mg by mouth every 6 (six) hours as needed.     Vitamin D , Ergocalciferol , (DRISDOL ) 50000 UNITS CAPS capsule Take 50,000 Units by mouth every 7 (seven) days. Wednesday     methotrexate  (RHEUMATREX) 2.5 MG tablet Take 2.5 mg by mouth once a week.     No current facility-administered medications for this visit.    LABS/IMAGING: No results found for this or any previous visit (from the past 48 hours). No results found.  No results found for: LIPOA    VITALS: BP 136/86   Pulse 76   Ht 5' 7 (1.702 m)   Wt 152 lb 12.8 oz (69.3 kg)   SpO2 98%   BMI 23.93 kg/m   EXAM: General appearance: alert, appears stated age, and no distress Neck: no carotid bruit and no JVD Lungs: clear to auscultation bilaterally Heart: regular rate and rhythm Abdomen: soft, non-tender; bowel sounds normal; no masses,  no organomegaly Extremities: extremities normal, atraumatic, no cyanosis or edema and varicose veins noted Pulses: 2+ and symmetric Skin: Skin color, texture, turgor normal. No rashes or lesions Neurologic: Grossly normal Psych: Pleasant  EKG: EKG Interpretation Date/Time:  Monday February 21 2024  10:00:18 EDT Ventricular Rate:  73 PR Interval:  156 QRS Duration:  82 QT Interval:  394 QTC Calculation: 434 R Axis:   41  Text Interpretation: Normal sinus rhythm Normal ECG When compared with ECG of 03-Aug-2023 01:27, Minimal criteria for Inferior infarct are no longer Present Confirmed by Mona Kent [phone]) on 02/21/2024  10:40:26 AM    ASSESSMENT: Chest pain with no ischemic EKG changes - low risk Myoview , EF 70% (2017) CAD - s/p PCI to the RCA with a 3.5 x 38 mm DES (10/09/2014) Elevated coronary artery calcium  score Rheumatoid arthritis Diabetes type 2 Dyslipidemia Hypertension Tobacco abuse - now quit x 3 months Peripheral arterial disease GERD Abdominal/upper mid epigastric pain  PLAN: 1.   Hayley Hogan has been under a lot of stress taking care of her husband at home.  She has recently had some headaches and issues with higher blood pressure and was off of amlodipine  due to swelling but has restarted it and is currently on 5 mg daily.  I think she should remain on that.  A1c also was very high recently at 10.7% in May.  She is working with her primary to try to improve that.  She cannot be on an SGLT2 inhibitor because she previously had tried that and it caused recurrent yeast infections.  No changes to her medicines today.  Will arrange for a follow-up in about 6 months.  Kent KYM Mona, MD, [facility], FNLA, FACP  [place]  [facility]  Medical Director of the [facility]  Cardiovascular Risk Reduction [facility] Diplomate of the American Board of Clinical Lipidology Attending Cardiologist  Direct Dial: [phone]  Fax: [phone]  Website:  [email or web address] Kent BROCKS Lavetta Geier 02/21/2024, 10:41 AM

## 2024-02-22 ENCOUNTER — Encounter: Payer: Self-pay | Admitting: General Practice

## 2024-02-22 DIAGNOSIS — E1165 Type 2 diabetes mellitus with hyperglycemia: Secondary | ICD-10-CM

## 2024-02-25 MED ORDER — TRULICITY 1.5 MG/0.5ML ~~LOC~~ SOAJ: 1.5000 mg | SUBCUTANEOUS | 1 refills | Status: DC

## 2024-02-25 NOTE — Telephone Encounter (Signed)
 Patient states this was supposed to go to [facility]? But it was sent to a different pharmacy; which is correct?

## 2024-02-25 NOTE — Telephone Encounter (Signed)
 Copied from CRM #[id number]. Topic: Clinical - Prescription Issue >> Feb 25, 2024  2:41 PM Sophia H wrote: Reason for CRM: Patient states Dulaglutide  (TRULICITY ) 1.5 MG/0.5ML SOAJ was sent to wrong pharmacy. Needing it sent to [facility] [place], [place] - [address].   Patient only has 2 weeks left, Ty.

## 2024-02-25 NOTE — Addendum Note (Signed)
 Addended by: GERONIMO MANUELITA SAUNDERS on: 02/25/2024 02:39 PM   Modules accepted: Orders

## 2024-02-25 NOTE — Addendum Note (Signed)
 Addended by: VINCENTE SHIVERS on: 02/25/2024 02:53 PM   Modules accepted: Orders

## 2024-02-25 NOTE — Telephone Encounter (Signed)
 Called patient and explained to her that rx was sent to [facility]. Nothing further needed at this time.

## 2024-03-13 ENCOUNTER — Ambulatory Visit: Payer: Self-pay | Admitting: General Practice

## 2024-03-13 ENCOUNTER — Ambulatory Visit: Admitting: General Practice

## 2024-03-13 ENCOUNTER — Encounter: Payer: Self-pay | Admitting: General Practice

## 2024-03-13 VITALS — BP 122/82 | HR 76 | Temp 97.7°F | Ht 67.0 in | Wt 153.0 lb

## 2024-03-13 DIAGNOSIS — I249 Acute ischemic heart disease, unspecified: Secondary | ICD-10-CM

## 2024-03-13 DIAGNOSIS — M069 Rheumatoid arthritis, unspecified: Secondary | ICD-10-CM

## 2024-03-13 DIAGNOSIS — E1165 Type 2 diabetes mellitus with hyperglycemia: Secondary | ICD-10-CM | POA: Diagnosis not present

## 2024-03-13 DIAGNOSIS — Z23 Encounter for immunization: Secondary | ICD-10-CM

## 2024-03-13 DIAGNOSIS — M810 Age-related osteoporosis without current pathological fracture: Secondary | ICD-10-CM

## 2024-03-13 DIAGNOSIS — I10 Essential (primary) hypertension: Secondary | ICD-10-CM

## 2024-03-13 DIAGNOSIS — E782 Mixed hyperlipidemia: Secondary | ICD-10-CM | POA: Diagnosis not present

## 2024-03-13 LAB — POCT GLYCOSYLATED HEMOGLOBIN (HGB A1C): Hemoglobin A1C: 7.4 % — AB (ref 4.0–5.6)

## 2024-03-13 NOTE — Progress Notes (Signed)
 Established Patient Office Visit  Subjective   Patient ID: Hayley Hogan, female    DOB: May 01, 1946  Age: 78 y.o. MRN: [id number]  Chief Complaint  Patient presents with   chronic care management    Patient had BP issues with it being high and cardiologist put her back on the amlodipine  5 mg daily and a baby aspirin .   Patient has been seeing rheum and placed on fosamax weekly.    HPI  Hayley Hogan is a 78 year old female with past medical history of HTN, PAD, CAD, lung nodule, GERD, DM2, RA, Osteopenia, HLD presents today for chronic care management.   Discussed the use of AI scribe software for clinical note transcription with the patient, who gave verbal consent to proceed.  History of Present Illness Hayley Hogan is a 78 year old female with hypertension, coronary artery disease, rheumatoid arthritis, and diabetes mellitus who presents for a follow-up visit.  She is managing her hypertension with amlodipine  5 mg daily, which was restarted after experiencing elevated blood pressure readings and headaches. Her blood pressure was recorded at 186/100 mmHg, and she also takes metoprolol  twice daily. She is the primary caregiver for her husband with stage four cancer, which contributes to her stress levels.  For coronary artery disease, she is on low-dose aspirin . She is monitoring for any complications related to its blood-thinning effects.  Regarding rheumatoid arthritis, she was recently restarted on methotrexate  at a dose of 20 mg once weekly. She reports improved hand function and reduced swelling. She continues to take prednisone  5 mg daily.  Her diabetes management has improved, with her A1c decreasing to 7.4 from 10.2. She attributes this to dietary changes, including reducing rice and potatoes and limiting sweets. She is on Trulicity  1.5 mg and metformin . She is addressing issues with medication supply from [facility].  She is the primary caregiver for her husband, contributing to  her stress levels. She notes improvement in urinary incontinence, which she attributes to better blood sugar control. No current issues with dizziness from amlodipine .    Patient Active Problem List   Diagnosis Date Noted   Vitamin D  deficiency 12/10/2023   Establishing care with new doctor, encounter for 11/09/2023   Leg swelling 11/09/2023   Anaphylaxis 08/03/2023   History of food allergy 08/03/2023   Uncontrolled type 2 diabetes mellitus with hyperglycemia, without long-term current use of insulin  (HCC) 08/03/2023   Sepsis (HCC) 02/23/2019   CAD in native artery 02/03/2018   Mixed hyperlipidemia 12/01/2016   Age-related osteoporosis without current pathological fracture 02/06/2016   GERD (gastroesophageal reflux disease) 12/19/2014   Stented coronary artery    Pain in the chest    Acute coronary syndrome [facility])    PAD (peripheral artery disease) 04/27/2014   Chest pain 03/15/2014   Elevated coronary artery calcium  score 03/15/2014   Lung nodule 05/11/2013   Osteopenia 05/11/2013   Atherosclerosis of aorta 05/11/2013   Thrombocytopenia, unspecified 05/11/2013   Benign neoplasm of colon 05/11/2013   Lower GI bleed 05/09/2013   Essential hypertension, benign 05/09/2013   Acute lower GI bleeding 05/09/2013   Chronic cholecystitis 02/18/2012   Rheumatoid arthritis (HCC) 01/07/2012   Headache 10/01/2011    Class: Acute   Nausea and vomiting 10/01/2011    Class: Acute   Hypertension 10/01/2011    Class: Acute   Past Medical History:  Diagnosis Date   Arthritis    DM type 2 (diabetes mellitus, type 2) (HCC)    Fatty liver disease, nonalcoholic  Hypertension    Migraines    when I was very young; before 1968   Neuromuscular disorder (HCC)    NSTEMI (non-ST elevated myocardial infarction) (HCC)    Rheumatoid arthritis(714.0)    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   w/transient inability to walk   Past Surgical History:  Procedure Laterality Date   BREAST  LUMPECTOMY  1970   right   COLONOSCOPY N/A 05/11/2013   Procedure: COLONOSCOPY;  Surgeon: Gwendlyn ONEIDA Buddy, MD;  Location: [facility] ENDOSCOPY;  Service: Endoscopy;  Laterality: N/A;   CORONARY ANGIOPLASTY WITH STENT PLACEMENT  10/09/14   Xience DES to RCA   FRACTURE SURGERY     plates and screws in right ankle   LEFT HEART CATHETERIZATION WITH CORONARY ANGIOGRAM N/A 10/09/2014   Procedure: LEFT HEART CATHETERIZATION WITH CORONARY ANGIOGRAM;  Surgeon: Alm LELON Clay, MD;  Location: [facility] CATH LAB;  Service: Cardiovascular;  Laterality: N/A;   ORIF ANKLE FRACTURE  09/2008   right   TUBAL LIGATION     VAGINAL HYSTERECTOMY  1982   Allergies  Allergen Reactions   Demerol Other (See Comments)    vomiting   Peanut-Containing Drug Products     Mouth swelling   Atorvastatin  Calcium  Other (See Comments)   Empagliflozin     Other Reaction(s): recurrent yeast infection         03/13/2024   10:08 AM 12/21/2023   11:19 AM 12/10/2023   10:55 AM  Depression screen PHQ 2/9  Decreased Interest 0 0 1  Down, Depressed, Hopeless 0 1 1  PHQ - 2 Score 0 1 2  Altered sleeping 0 0 1  Tired, decreased energy 0 1 0  Change in appetite 0 0 1  Feeling bad or failure about yourself  0 0 0  Trouble concentrating 0 0 0  Moving slowly or fidgety/restless 0 1 0  Suicidal thoughts 0 0 0  PHQ-9 Score 0 3 4  Difficult doing work/chores Not difficult at all Not difficult at all Not difficult at all       03/13/2024   10:08 AM 12/10/2023   10:55 AM 11/09/2023   10:15 AM  GAD 7 : Generalized Anxiety Score  Nervous, Anxious, on Edge 0 0 0  Control/stop worrying 0 0 0  Worry too much - different things 0 1 0  Trouble relaxing 0 1 0  Restless 0 0 0  Easily annoyed or irritable 0 0 1  Afraid - awful might happen 0 1 0  Total GAD 7 Score 0 3 1  Anxiety Difficulty Not difficult at all Not difficult at all Not difficult at all      Review of Systems  Constitutional:  Negative for chills and fever.  Respiratory:   Negative for shortness of breath.   Cardiovascular:  Negative for chest pain.  Gastrointestinal:  Negative for abdominal pain, constipation, diarrhea, heartburn, nausea and vomiting.  Genitourinary:  Negative for dysuria, frequency and urgency.  Neurological:  Negative for dizziness and headaches.  Endo/Heme/Allergies:  Negative for polydipsia.  Psychiatric/Behavioral:  Negative for depression and suicidal ideas. The patient is not nervous/anxious.       Objective:     BP 122/82   Pulse 76   Temp 97.7 F (36.5 C) (Temporal)   Ht 5' 7 (1.702 m)   Wt 153 lb (69.4 kg)   SpO2 96%   BMI 23.96 kg/m  BP Readings from Last 3 Encounters:  03/13/24 122/82  02/21/24 136/86  12/21/23 116/78   Wt Readings  from Last 3 Encounters:  03/13/24 153 lb (69.4 kg)  02/21/24 152 lb 12.8 oz (69.3 kg)  12/21/23 155 lb (70.3 kg)      Physical Exam Vitals and nursing note reviewed.  Constitutional:      Appearance: Normal appearance.  Cardiovascular:     Rate and Rhythm: Normal rate and regular rhythm.     Pulses: Normal pulses.     Heart sounds: Normal heart sounds.  Pulmonary:     Effort: Pulmonary effort is normal.     Breath sounds: Normal breath sounds.  Neurological:     Mental Status: She is alert and oriented to person, place, and time.  Psychiatric:        Mood and Affect: Mood normal.        Behavior: Behavior normal.        Thought Content: Thought content normal.        Judgment: Judgment normal.      Results for orders placed or performed in visit on 03/13/24  POCT HgB A1C  Result Value Ref Range   Hemoglobin A1C 7.4 (A) 4.0 - 5.6 %   HbA1c POC (<> result, manual entry)     HbA1c, POC (prediabetic range)     HbA1c, POC (controlled diabetic range)         The ASCVD Risk score (Arnett DK, et al., 2019) failed to calculate for the following reasons:   Risk score cannot be calculated because patient has a medical history suggesting prior/existing ASCVD     Assessment & Plan:  Uncontrolled type 2 diabetes mellitus with hyperglycemia, without long-term current use of insulin  (HCC) Assessment & Plan: Improved. A1c today of 7.4.   Commended her on her progress.   Continue Metformin  1000 BID.  Continue Trulicity  1.5 mg once weekly. She has been having trouble retrieving her medication. She will f/u with the pharmacist.   Foot exam UTD.  Urine ACR UTD.  Pneumonia UTD.  Eye exam UTD.   F/u in 3 months.   Orders: -     POCT glycosylated hemoglobin (Hb A1C)  Primary hypertension Assessment & Plan: Controlled and at goal.   Following with cardiology. Reviewed notes.   Continue Metoprolol  BID and amlodipine  once daily.    Mixed hyperlipidemia Assessment & Plan: Controlled.  Following with cardiology.  Continue Rosuvastatin  20 mg once daily.  Will repeat lipid panel in three months.   Rheumatoid arthritis of wrist, unspecified laterality, unspecified whether rheumatoid factor present [facility]) Assessment & Plan: Improving.  Reviewed rheumatology notes.   Started on Methotrexate .  Continue Prednisone  per rheumatology.   Will continue to monitor.    Age-related osteoporosis without current pathological fracture Assessment & Plan: Dexa scan shows osteoporosis.  Per rheumatology, start Fosamax once weekly.   Will repeat scan in 2 years.    Acute coronary syndrome [facility]) Assessment & Plan: Reviewed cardiology notes.  Continue Asprin 81 mg once daily.     Return in about 3 months (around 06/12/2024) for chronic care management.SABRA Carrol Aurora, NP

## 2024-03-13 NOTE — Assessment & Plan Note (Addendum)
 Improving.  Reviewed rheumatology notes.   Started on Methotrexate .  Continue Prednisone  per rheumatology.   Will continue to monitor.

## 2024-03-13 NOTE — Assessment & Plan Note (Signed)
 Dexa scan shows osteoporosis.  Per rheumatology, start Fosamax once weekly.   Will repeat scan in 2 years.

## 2024-03-13 NOTE — Assessment & Plan Note (Signed)
 Controlled.  Following with cardiology.  Continue Rosuvastatin  20 mg once daily.  Will repeat lipid panel in three months.

## 2024-03-13 NOTE — Assessment & Plan Note (Signed)
 Reviewed cardiology notes.  Continue Asprin 81 mg once daily.

## 2024-03-13 NOTE — Assessment & Plan Note (Signed)
 Controlled and at goal.   Following with cardiology. Reviewed notes.   Continue Metoprolol  BID and amlodipine  once daily.

## 2024-03-13 NOTE — Addendum Note (Signed)
 Addended by: TENNIE RAISIN B on: 03/13/2024 10:45 AM   Modules accepted: Orders

## 2024-03-13 NOTE — Patient Instructions (Addendum)
 Please call the pharmacist to get an update on your medication. I will also reach out to her. Please also ask about your renewal application.   Manuelita FABIENE Kobs, PharmD Clinical Pharmacist [phone]  Follow up in 3 months for chronic care management.   It was a pleasure to see you today!

## 2024-03-13 NOTE — Assessment & Plan Note (Signed)
 Improved. A1c today of 7.4.   Commended her on her progress.   Continue Metformin  1000 BID.  Continue Trulicity  1.5 mg once weekly. She has been having trouble retrieving her medication. She will f/u with the pharmacist.   Foot exam UTD.  Urine ACR UTD.  Pneumonia UTD.  Eye exam UTD.   F/u in 3 months.

## 2024-03-14 ENCOUNTER — Other Ambulatory Visit: Payer: Self-pay | Admitting: Pharmacist

## 2024-03-14 DIAGNOSIS — E1165 Type 2 diabetes mellitus with hyperglycemia: Secondary | ICD-10-CM

## 2024-03-14 MED ORDER — TRULICITY 1.5 MG/0.5ML ~~LOC~~ SOAJ: SUBCUTANEOUS | 1 refills | Status: DC

## 2024-03-14 NOTE — Progress Notes (Signed)
 Chart Review Reason: Patient question regarding Trulicity  refills   Summary: Called [facility] to request status if current shipment.  They report that a 3-4 month rx is requested for refills. Last written for 1 month.   Trulicity  1.5 mg pended to PCP for 60-month supply.  [facility] = [facility].

## 2024-03-29 ENCOUNTER — Other Ambulatory Visit: Payer: Self-pay | Admitting: General Practice

## 2024-03-30 MED ORDER — ACCU-CHEK SOFTCLIX LANCETS MISC
12 refills | Status: AC
Start: 2024-03-30 — End: ?

## 2024-03-30 MED ORDER — ACCU-CHEK GUIDE TEST VI STRP: ORAL_STRIP | 12 refills | Status: AC

## 2024-03-30 MED ORDER — ACCU-CHEK GUIDE W/DEVICE KIT: PACK | 0 refills | Status: AC

## 2024-05-02 ENCOUNTER — Ambulatory Visit: Payer: Self-pay

## 2024-05-02 ENCOUNTER — Ambulatory Visit
Admission: EM | Admit: 2024-05-02 | Discharge: 2024-05-02 | Disposition: A | Discharge status: Home / Self Care | Attending: Emergency Medicine | Admitting: Emergency Medicine

## 2024-05-02 ENCOUNTER — Encounter: Payer: Self-pay | Admitting: Emergency Medicine

## 2024-05-02 DIAGNOSIS — J039 Acute tonsillitis, unspecified: Secondary | ICD-10-CM | POA: Diagnosis present

## 2024-05-02 DIAGNOSIS — J029 Acute pharyngitis, unspecified: Secondary | ICD-10-CM | POA: Insufficient documentation

## 2024-05-02 LAB — POCT RAPID STREP A (OFFICE): Rapid Strep A Screen: NEGATIVE

## 2024-05-02 MED ORDER — AMOXICILLIN 250 MG/5ML PO SUSR: 500.0000 mg | Freq: Three times a day (TID) | ORAL | 0 refills | Status: AC

## 2024-05-02 MED ORDER — LIDOCAINE VISCOUS HCL 2 % MT SOLN: 15.0000 mL | OROMUCOSAL | 0 refills | Status: DC | PRN

## 2024-05-02 NOTE — Telephone Encounter (Signed)
Noted.  Agree with disposition.

## 2024-05-02 NOTE — ED Triage Notes (Signed)
 Patient reports sore throat , bilateral ear pain, head ache x 3 days. Patient took [facility] powder on yesterday. Patient states it difficult  to swallow. Rates sore throat 10/10, bilateral ear pain 5/10 and headache 3/10.

## 2024-05-02 NOTE — Telephone Encounter (Signed)
 FYI Only or Action Required?: FYI only for provider: patient going to urgent care.  Patient was last seen in primary care on 03/13/2024 by Vincente Shivers, NP.  Called Nurse Triage reporting Sore Throat.  Symptoms began 3 days ago.  Interventions attempted: Rest, hydration, or home remedies.  Symptoms are: gradually worsening.  Triage Disposition: See Physician Within 24 Hours  Patient/caregiver understands and will follow disposition?: No appointment available today, patient going to urgent care       Copied from CRM #[id number]. Topic: Clinical - Red Word Triage >> May 02, 2024 10:08 AM Dedra B wrote: Red Word that prompted transfer to Nurse Triage: Pt has a severe throat and can't even swallow her saliva. She thinks her ears are also infected. Pt took a home covid test and it was negative. Warm transfer to NT.     Reason for Disposition  SEVERE throat pain (e.g., excruciating)  Answer Assessment - Initial Assessment Questions 1. ONSET: When did the throat start hurting? (Hours or days ago)      3 days ago  2. SEVERITY: How bad is the sore throat? (Scale 1-10; mild, moderate or severe)     Moderate to severe  3. STREP EXPOSURE: Has there been any exposure to strep within the past week? If Yes, ask: What type of contact occurred?      No 4.  VIRAL SYMPTOMS: Are there any symptoms of a cold, such as a runny nose, cough, hoarse voice or red eyes?      Mild nasal congestion  5. FEVER: Do you have a fever? If Yes, ask: What is your temperature, how was it measured, and when did it start?     No 6. PUS ON THE TONSILS: Is there pus on the tonsils in the back of your throat?     Unsure  7. OTHER SYMPTOMS: Do you have any other symptoms? (e.g., difficulty breathing, headache, rash)     Bilateral ear pain, difficulty swallowing secondary to pain  Protocols used: Sore Throat-A-AH

## 2024-05-02 NOTE — Discharge Instructions (Signed)
 Today you are evaluated for your throat pain  Rapid strep test is negative, swab is been sent to the lab to determine if bacteria will grow and you will be notified if this occurs  Based on the appearance of your throat during the exam you will be started on antibiotics today  Take amoxicillin every 8 hours for 7 days for bacterial treatment  May gargle and spit lidocaine  solution every 4 hours to provide temporary relief to your throat  May take Tylenol  as needed for pain  Increase your fluid intake until you are able to tolerate foods, start with softer blander foods then you may eat as normal if tolerated  If any further concerns may follow-up with urgent care as needed

## 2024-05-02 NOTE — ED Provider Notes (Signed)
 Hayley Hogan    CSN: [id number] Arrival date & time: 05/02/24  1225      History   Chief Complaint Chief Complaint  Patient presents with   Sore Throat   Otalgia   Headache    HPI Hayley Hogan is a 78 y.o. female.   Presents for evaluation of subjective fever, bilateral ear pain, sore throat and intermittent headaches beginning 3 days ago.  Symptoms progressively worsening.  Painful to swallow, described as severe making it difficult to swallow unable to tolerate food nor liquids.  No known sick contacts.  Has attempted Tylenol  but was unable to get the pill down and therefore she attempted [facility] powder which helped with headache.       Past Medical History:  Diagnosis Date   Arthritis    DM type 2 (diabetes mellitus, type 2) (HCC)    Fatty liver disease, nonalcoholic    Hypertension    Migraines    when I was very young; before 1968   Neuromuscular disorder (HCC)    NSTEMI (non-ST elevated myocardial infarction) (HCC)    Rheumatoid arthritis(714.0)    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   w/transient inability to walk    Patient Active Problem List   Diagnosis Date Noted   Vitamin D  deficiency 12/10/2023   Establishing care with new doctor, encounter for 11/09/2023   Leg swelling 11/09/2023   Anaphylaxis 08/03/2023   History of food allergy 08/03/2023   Uncontrolled type 2 diabetes mellitus with hyperglycemia, without long-term current use of insulin  (HCC) 08/03/2023   Sepsis (HCC) 02/23/2019   CAD in native artery 02/03/2018   Mixed hyperlipidemia 12/01/2016   Age-related osteoporosis without current pathological fracture 02/06/2016   GERD (gastroesophageal reflux disease) 12/19/2014   Stented coronary artery    Pain in the chest    Acute coronary syndrome [facility])    PAD (peripheral artery disease) 04/27/2014   Chest pain 03/15/2014   Elevated coronary artery calcium  score 03/15/2014   Lung nodule 05/11/2013   Osteopenia 05/11/2013    Atherosclerosis of aorta 05/11/2013   Thrombocytopenia, unspecified 05/11/2013   Benign neoplasm of colon 05/11/2013   Lower GI bleed 05/09/2013   Essential hypertension, benign 05/09/2013   Acute lower GI bleeding 05/09/2013   Chronic cholecystitis 02/18/2012   Rheumatoid arthritis (HCC) 01/07/2012   Headache 10/01/2011    Class: Acute   Nausea and vomiting 10/01/2011    Class: Acute   Hypertension 10/01/2011    Class: Acute    Past Surgical History:  Procedure Laterality Date   BREAST LUMPECTOMY  1970   right   COLONOSCOPY N/A 05/11/2013   Procedure: COLONOSCOPY;  Surgeon: Gwendlyn ONEIDA Buddy, MD;  Location: [facility] ENDOSCOPY;  Service: Endoscopy;  Laterality: N/A;   CORONARY ANGIOPLASTY WITH STENT PLACEMENT  10/09/14   Xience DES to RCA   FRACTURE SURGERY     plates and screws in right ankle   LEFT HEART CATHETERIZATION WITH CORONARY ANGIOGRAM N/A 10/09/2014   Procedure: LEFT HEART CATHETERIZATION WITH CORONARY ANGIOGRAM;  Surgeon: Alm LELON Clay, MD;  Location[facility] CATH LAB;  Service: Cardiovascular;  Laterality: N/A;   ORIF ANKLE FRACTURE  09/2008   right   TUBAL LIGATION     VAGINAL HYSTERECTOMY  1982    OB History   No obstetric history on file.      Home Medications    Prior to Admission medications   Medication Sig Start Date End Date Taking? Authorizing Provider  Accu-Chek Softclix Lancets lancets USE TO  CHECK BLOOD SUGAR DAILY. DX E 11.9 03/30/24   Vincente Shivers, NP  alendronate (FOSAMAX) 70 MG tablet Take 70 mg by mouth once a week. 03/09/24 03/09/25  [provider]  amLODipine  (NORVASC ) 2.5 MG tablet Take 5 mg by mouth daily. 01/27/24   [provider]  aspirin  EC 81 MG tablet Take 81 mg by mouth daily. Swallow whole.    [provider]  Blood Glucose Monitoring Suppl (ACCU-CHEK GUIDE) w/Device KIT USE TO CHECK BLOOD SUGAR DAILY. DX E 11.9 03/30/24   Vincente Shivers, NP  Dulaglutide  (TRULICITY ) 1.5 MG/0.5ML SOAJ Inject 1.5 mg sq once weekly  03/14/24   Vincente Shivers, NP  EPINEPHrine  0.3 mg/0.3 mL IJ SOAJ injection Inject 0.3 mg into the muscle as needed for anaphylaxis. 08/03/23   Jens Durand, MD  folic acid  (FOLVITE ) 1 MG tablet Take 1 mg by mouth daily. 01/10/24 01/09/25  [provider]  glucose blood (ACCU-CHEK GUIDE TEST) test strip USE TO CHECK BLOOD SUGAR DAILY. DX E 11.9 03/30/24   Vincente Shivers, NP  metFORMIN  (GLUCOPHAGE -XR) 500 MG 24 hr tablet Take 1,000 mg by mouth 2 (two) times daily.  11/26/14   [provider]  methotrexate  (RHEUMATREX) 2.5 MG tablet Take 2.5 mg by mouth once a week.    [provider]  metoprolol  (LOPRESSOR ) 100 MG tablet Take 100 mg by mouth 2 (two) times daily.    [provider]  nitroGLYCERIN  (NITROSTAT ) 0.4 MG SL tablet Place 1 tablet (0.4 mg total) under the tongue every 5 (five) minutes as needed for chest pain. 10/30/14   Marylu Leita SAUNDERS, NP  olmesartan (BENICAR) 40 MG tablet Take 40 mg by mouth daily. 11/13/22   [provider]  omeprazole  (PRILOSEC) 20 MG capsule Take 1 capsule (20 mg total) by mouth daily. 02/07/24   Vincente Shivers, NP  predniSONE  (DELTASONE ) 5 MG tablet Take 5 mg by mouth daily with breakfast.    [provider]  rosuvastatin  (CRESTOR ) 20 MG tablet Take 20 mg by mouth daily. 04/21/20   [provider]  traMADol  (ULTRAM ) 50 MG tablet Take 50 mg by mouth every 6 (six) hours as needed. 10/20/23   [provider]  Vitamin D , Ergocalciferol , (DRISDOL ) 50000 UNITS CAPS capsule Take 50,000 Units by mouth every 7 (seven) days. Wednesday    [provider]    Family History Family History  Problem Relation Age of Onset   Hypertension Mother    Cancer Father    Heart Problems Maternal Grandfather    Cancer Brother    Cancer Brother    Other Brother        GSW   Intellectual disability Brother    Stroke Sister        x2   Diabetes Sister    Hypertension Sister        x2   COPD Sister    Diabetes  Sister    Stroke Sister     Social History Social History   Tobacco Use   Smoking status: Former    Current packs/day: 0.00    Average packs/day: 0.6 packs/day for 73.0 years (45.0 ttl pk-yrs)    Types: Cigarettes    Start date: 10/08/1979    Quit date: 06/14/2016    Years since quitting: 7.8   Smokeless tobacco: Never   Tobacco comments:    Stopped for 6 months in 2016 and for good 06/14/2016  Substance Use Topics   Alcohol use: Yes    Alcohol/week: 1.0  standard drink of alcohol    Types: 1 Glasses of wine per week    Comment: Drink wine occasionally   Drug use: Yes    Types: Marijuana    Comment: marijuana in the 1960's     Allergies   Demerol, Peanut-containing drug products, Atorvastatin  calcium , and Empagliflozin   Review of Systems Review of Systems  Constitutional:  Positive for fever. Negative for activity change, appetite change, chills, diaphoresis, fatigue and unexpected weight change.  HENT:  Positive for ear pain and sore throat. Negative for congestion, dental problem, drooling, ear discharge, facial swelling, hearing loss, mouth sores, nosebleeds, postnasal drip, rhinorrhea, sinus pressure, sinus pain, sneezing, tinnitus, trouble swallowing and voice change.   Respiratory:  Negative for apnea, cough, choking, chest tightness, shortness of breath, wheezing and stridor.   Cardiovascular: Negative.   Gastrointestinal: Negative.   Neurological:  Positive for headaches. Negative for dizziness, tremors, seizures, syncope, facial asymmetry, speech difficulty, weakness, light-headedness and numbness.     Physical Exam Triage Vital Signs ED Triage Vitals  Encounter Vitals Group     BP 05/02/24 1232 131/81     Girls Systolic BP Percentile --      Girls Diastolic BP Percentile --      Boys Systolic BP Percentile --      Boys Diastolic BP Percentile --      Pulse Rate 05/02/24 1232 80     Resp 05/02/24 1232 20     Temp 05/02/24 1232 99.9 F (37.7 C)      Temp Source 05/02/24 1232 Oral     SpO2 05/02/24 1232 97 %     Weight --      Height --      Head Circumference --      Peak Flow --      Pain Score 05/02/24 1236 10     Pain Loc --      Pain Education --      Exclude from Growth Chart --    No data found.  Updated Vital Signs BP 131/81 (BP Location: Left Arm)   Pulse 80   Temp 99.9 F (37.7 C) (Oral)   Resp 20   SpO2 97%   Visual Acuity Right Eye Distance:   Left Eye Distance:   Bilateral Distance:    Right Eye Near:   Left Eye Near:    Bilateral Near:     Physical Exam Constitutional:      Appearance: She is well-developed.  HENT:     Right Ear: Tympanic membrane and ear canal normal.     Left Ear: Tympanic membrane and ear canal normal.     Nose: Congestion present.     Mouth/Throat:     Pharynx: Oropharyngeal exudate present. No posterior oropharyngeal erythema.     Tonsils: Tonsillar exudate present.  Cardiovascular:     Rate and Rhythm: Normal rate and regular rhythm.     Heart sounds: Normal heart sounds.  Neurological:     Mental Status: She is alert and oriented to person, place, and time.      UC Treatments / Results  Labs (all labs ordered are listed, but only abnormal results are displayed) Labs Reviewed  POCT RAPID STREP A (OFFICE)    EKG   Radiology No results found.  Procedures Procedures (including critical care time)  Medications Ordered in UC Medications - No data to display  Initial Impression / Assessment and Plan / UC Course  I have reviewed the triage vital signs  and the nursing notes.  Pertinent labs & imaging results that were available during my care of the patient were reviewed by me and considered in my medical decision making (see chart for details).  Acute tonsillitis  Vital signs are stable patient in no signs of distress nontoxic-appearing significant exudate within the oropharynx without erythema, rapid strep test negative, sent to culture, placed on  amoxicillin based on presentation and additionally prescribed viscous lidocaine  for comfort recommended reattempting over-the-counter analgesics for further pain management, advised increase fluids until able to tolerate food, advised to follow-up for any persisting or worsening send Final Clinical Impressions(s) / UC Diagnoses   Final diagnoses:  Sore throat   Discharge Instructions   None    ED Prescriptions   None    PDMP not reviewed this encounter.   Teresa Shelba SAUNDERS, NP 05/02/24 1325

## 2024-05-05 ENCOUNTER — Ambulatory Visit: Payer: Self-pay

## 2024-05-05 LAB — CULTURE, GROUP A STREP (THRC)

## 2024-05-12 ENCOUNTER — Telehealth: Payer: Self-pay

## 2024-05-12 NOTE — Telephone Encounter (Signed)
 Received pt portion [employer] (Trul[employer] ) along proof of income back from pt, waiting on provider portion

## 2024-05-16 NOTE — Telephone Encounter (Signed)
 Spoke with pt today explain she is receiving text msg from [employer] regarding her re-enrollment ,explain to pt we will submitted pap once we received provider portion,faxed provider portion today.

## 2024-05-16 NOTE — Telephone Encounter (Signed)
 Patient called in to speak with Shasta the pharmacist.Patient stated that [facility] care contacted er and stated that the information that was sent to them for patient assistance for Trulicity  was not the completed.She would ike to know will Ana be taking care of this and do she need to do anything else?

## 2024-05-19 NOTE — Telephone Encounter (Signed)
 Received provider portion [employer], faxed to [employer] along proof of income ,ins card.

## 2024-06-12 ENCOUNTER — Encounter: Payer: Self-pay | Admitting: General Practice

## 2024-06-12 ENCOUNTER — Ambulatory Visit: Admitting: General Practice

## 2024-06-12 ENCOUNTER — Ambulatory Visit: Payer: Self-pay | Admitting: General Practice

## 2024-06-12 VITALS — BP 124/78 | HR 76 | Temp 97.6°F | Ht 67.0 in | Wt 154.0 lb

## 2024-06-12 DIAGNOSIS — I10 Essential (primary) hypertension: Secondary | ICD-10-CM | POA: Diagnosis not present

## 2024-06-12 DIAGNOSIS — E1165 Type 2 diabetes mellitus with hyperglycemia: Secondary | ICD-10-CM | POA: Diagnosis not present

## 2024-06-12 DIAGNOSIS — E782 Mixed hyperlipidemia: Secondary | ICD-10-CM | POA: Diagnosis not present

## 2024-06-12 DIAGNOSIS — Z7984 Long term (current) use of oral hypoglycemic drugs: Secondary | ICD-10-CM | POA: Diagnosis not present

## 2024-06-12 DIAGNOSIS — K760 Fatty (change of) liver, not elsewhere classified: Secondary | ICD-10-CM | POA: Diagnosis not present

## 2024-06-12 LAB — COMPREHENSIVE METABOLIC PANEL WITH GFR
ALT: 22 U/L (ref 3–35)
AST: 18 U/L (ref 5–37)
Albumin: 3.9 g/dL (ref 3.5–5.2)
Alkaline Phosphatase: 30 U/L — ABNORMAL LOW (ref 39–117)
BUN: 15 mg/dL (ref 6–23)
CO2: 28 meq/L (ref 19–32)
Calcium: 9.3 mg/dL (ref 8.4–10.5)
Chloride: 102 meq/L (ref 96–112)
Creatinine, Ser: 0.71 mg/dL (ref 0.40–1.20)
GFR: 82.68 mL/min
Glucose, Bld: 155 mg/dL — ABNORMAL HIGH (ref 70–99)
Potassium: 4.5 meq/L (ref 3.5–5.1)
Sodium: 138 meq/L (ref 135–145)
Total Bilirubin: 0.4 mg/dL (ref 0.2–1.2)
Total Protein: 6.6 g/dL (ref 6.0–8.3)

## 2024-06-12 LAB — HEMOGLOBIN A1C: Hgb A1c MFr Bld: 8 % — ABNORMAL HIGH (ref 4.6–6.5)

## 2024-06-12 LAB — LIPID PANEL
Cholesterol: 110 mg/dL (ref 28–200)
HDL: 49.5 mg/dL
LDL Cholesterol: 36 mg/dL (ref 10–99)
NonHDL: 60.26
Total CHOL/HDL Ratio: 2
Triglycerides: 122 mg/dL (ref 10.0–149.0)
VLDL: 24.4 mg/dL (ref 0.0–40.0)

## 2024-06-12 NOTE — Patient Instructions (Signed)
 Stop by the lab prior to leaving today. I will notify you of your results once received.   Continue medication as is.   I will let you know if we need to adjust the diabetes medication after the lab results.   Follow up in 3 months.   It was a pleasure to see you today!

## 2024-06-12 NOTE — Progress Notes (Signed)
 "  Established Patient Office Visit  Subjective   Patient ID: Hayley Hogan, female    DOB: 08-04-1945  Age: 78 y.o. MRN: [id number]  Chief Complaint  Patient presents with   chronic care management    Patient here today to discuss chronic conditions     HPI  Hayley Hogan is a 77 year old female with past medical history of DM2, HTN, CAD, PAD, lung nodule, RA, osteoporosis, HLD, vitamin d  deficiency presents today for chronic care management.   Discussed the use of AI scribe software for clinical note transcription with the patient, who gave verbal consent to proceed.  History of Present Illness Hayley Hogan is a 78 year old female with hypertension, rheumatoid arthritis, and diabetes who presents for a follow-up visit to manage her chronic conditions.  She is following with cardiology. She has been monitoring her blood pressure at home and is currently taking amlodipine  5 mg daily, metoprolol  100 mg twice daily, and olmesartan 40 mg once daily.  She has a history of rheumatoid arthritis and was previously on methotrexate  for ten years. Recently, her rheumatologist increased her methotrexate  dose to 20 mg weekly, but she stopped it after her liver function tests showed abnormalities. Her liver function has since normalized. She continues to take prednisone  and is awaiting insurance approval for Humira.  She has type 2 diabetes and is currently on Trulicity  1.5 mg subcutaneous once weekly and metformin  1000 mg oral twice daily. She experienced a break in her Trulicity  supply for seven weeks but has since resumed it. Her last A1c was 7.4% in September, down from 10% previously. She has four doses of Trulicity  remaining.  She mentions a history of fatty liver and gallstones, with a past liver biopsy confirming fatty liver rather than cirrhosis.   Her diet consists of low-calorie foods, primarily vegetables, and she prefers vegetarian meals. She has reduced her caloric intake and reports eating  eggs and beans for protein. No recent illnesses except for a bout of tonsillitis a month ago.    Patient Active Problem List   Diagnosis Date Noted   Hepatic steatosis 06/12/2024   Vitamin D  deficiency 12/10/2023   Establishing care with new doctor, encounter for 11/09/2023   Leg swelling 11/09/2023   Anaphylaxis 08/03/2023   History of food allergy 08/03/2023   Uncontrolled type 2 diabetes mellitus with hyperglycemia, without long-term current use of insulin  (HCC) 08/03/2023   Sepsis (HCC) 02/23/2019   CAD in native artery 02/03/2018   Mixed hyperlipidemia 12/01/2016   Age-related osteoporosis without current pathological fracture 02/06/2016   GERD (gastroesophageal reflux disease) 12/19/2014   Stented coronary artery    Pain in the chest    Acute coronary syndrome [facility])    PAD (peripheral artery disease) 04/27/2014   Chest pain 03/15/2014   Elevated coronary artery calcium  score 03/15/2014   Lung nodule 05/11/2013   Osteopenia 05/11/2013   Atherosclerosis of aorta 05/11/2013   Thrombocytopenia, unspecified 05/11/2013   Benign neoplasm of colon 05/11/2013   Lower GI bleed 05/09/2013   Essential hypertension, benign 05/09/2013   Acute lower GI bleeding 05/09/2013   Chronic cholecystitis 02/18/2012   Rheumatoid arthritis (HCC) 01/07/2012   Headache 10/01/2011    Class: Acute   Nausea and vomiting 10/01/2011    Class: Acute   Hypertension 10/01/2011    Class: Acute   Past Medical History:  Diagnosis Date   Arthritis    Disorder of bone and cartilage 01/29/2010   DM type 2 (diabetes mellitus, type  2) (HCC)    Fatty liver disease, nonalcoholic    Hypertension    Migraines    when I was very young; before 1968   Neuromuscular disorder (HCC)    NSTEMI (non-ST elevated myocardial infarction) (HCC)    Rheumatoid arthritis(714.0)    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   w/transient inability to walk   Past Surgical History:  Procedure Laterality Date   BREAST  LUMPECTOMY  1970   right   COLONOSCOPY N/A 05/11/2013   Procedure: COLONOSCOPY;  Surgeon: Gwendlyn ONEIDA Buddy, MD;  Location: [facility] ENDOSCOPY;  Service: Endoscopy;  Laterality: N/A;   CORONARY ANGIOPLASTY WITH STENT PLACEMENT  10/09/14   Xience DES to RCA   FRACTURE SURGERY     plates and screws in right ankle   LEFT HEART CATHETERIZATION WITH CORONARY ANGIOGRAM N/A 10/09/2014   Procedure: LEFT HEART CATHETERIZATION WITH CORONARY ANGIOGRAM;  Surgeon: Alm LELON Clay, MD;  Location: [facility] CATH LAB;  Service: Cardiovascular;  Laterality: N/A;   ORIF ANKLE FRACTURE  09/2008   right   TUBAL LIGATION     VAGINAL HYSTERECTOMY  1982   Allergies[1]       06/12/2024   10:06 AM 03/13/2024   10:08 AM 12/21/2023   11:19 AM  Depression screen PHQ 2/9  Decreased Interest 0 0 0  Down, Depressed, Hopeless 0 0 1  PHQ - 2 Score 0 0 1  Altered sleeping 0 0 0  Tired, decreased energy 1 0 1  Change in appetite 0 0 0  Feeling bad or failure about yourself  0 0 0  Trouble concentrating 0 0 0  Moving slowly or fidgety/restless 0 0 1  Suicidal thoughts 0 0 0  PHQ-9 Score 1 0  3   Difficult doing work/chores Not difficult at all Not difficult at all Not difficult at all     Data saved with a previous flowsheet row definition       06/12/2024   10:06 AM 03/13/2024   10:08 AM 12/10/2023   10:55 AM 11/09/2023   10:15 AM  GAD 7 : Generalized Anxiety Score  Nervous, Anxious, on Edge 0 0 0 0  Control/stop worrying 0 0 0 0  Worry too much - different things 0 0 1 0  Trouble relaxing 0 0 1 0  Restless 0 0 0 0  Easily annoyed or irritable 1 0 0 1  Afraid - awful might happen 1 0 1 0  Total GAD 7 Score 2 0 3 1  Anxiety Difficulty Not difficult at all Not difficult at all Not difficult at all Not difficult at all      Review of Systems  Constitutional:  Negative for chills and fever.  Respiratory:  Negative for shortness of breath.   Cardiovascular:  Negative for chest pain.  Gastrointestinal:  Negative for  abdominal pain, constipation, diarrhea, heartburn, nausea and vomiting.  Genitourinary:  Negative for dysuria, frequency and urgency.  Neurological:  Negative for dizziness and headaches.  Endo/Heme/Allergies:  Negative for polydipsia.  Psychiatric/Behavioral:  Negative for depression and suicidal ideas. The patient is not nervous/anxious.       Objective:     BP 124/78   Pulse 76   Temp 97.6 F (36.4 C) (Temporal)   Ht 5' 7 (1.702 m)   Wt 154 lb (69.9 kg)   SpO2 99%   BMI 24.12 kg/m  BP Readings from Last 3 Encounters:  06/12/24 124/78  05/02/24 131/81  03/13/24 122/82   Wt Readings from Last  3 Encounters:  06/12/24 154 lb (69.9 kg)  03/13/24 153 lb (69.4 kg)  02/21/24 152 lb 12.8 oz (69.3 kg)      Physical Exam Vitals and nursing note reviewed.  Constitutional:      Appearance: Normal appearance.  Cardiovascular:     Rate and Rhythm: Normal rate and regular rhythm.     Pulses: Normal pulses.     Heart sounds: Normal heart sounds.  Pulmonary:     Effort: Pulmonary effort is normal.     Breath sounds: Normal breath sounds.  Neurological:     Mental Status: She is alert and oriented to person, place, and time.  Psychiatric:        Mood and Affect: Mood normal.        Behavior: Behavior normal.        Thought Content: Thought content normal.        Judgment: Judgment normal.      No results found for any visits on 06/12/24.     The ASCVD Risk score (Arnett DK, et al., 2019) failed to calculate for the following reasons:   Risk score cannot be calculated because patient has a medical history suggesting prior/existing ASCVD   * - Cholesterol units were assumed    Assessment & Plan:  Uncontrolled type 2 diabetes mellitus with hyperglycemia, without long-term current use of insulin  (HCC) -     Hemoglobin A1c -     Comprehensive metabolic panel with GFR  Mixed hyperlipidemia -     Lipid panel  Primary hypertension  Hepatic steatosis    Assessment  and Plan Assessment & Plan Uncontrolled type 2 diabetes mellitus with hyperglycemia A1c improved from 10 to 7.4. Trulicity  dose is 1.5 mg weekly, with a recent 7-week gap. Stress from husband's illness may affect glycemic control. - A1c pending. - Maintain Trulicity  dose if A1c < 6.5. - Consider dose adjustment if A1c > 6.5. - Ensure timely Trulicity  refills. - Continue Metformin  1000 mg BID.  - discussed the importance of diet and exericse.  Rheumatoid arthritis Methotrexate  stopped due to elevated liver enzymes, which normalized after discontinuation. Awaiting insurance approval for Humira. - reviewed rheumatology notes and labs. - Contact rheumatology for methotrexate  restart or Humira initiation.  Primary hypertension Blood pressure well-controlled with current regimen. - reviewed cardiologist notes and labs. - Continue current antihypertensive regimen.  Mixed hyperlipidemia LDL previously at 49, below target. Triglycerides were high. - Checked cholesterol levels today.  Hepatic steatosis Fatty liver confirmed by biopsy. Liver function normalized post-methotrexate . Weight loss beneficial. - Monitor liver function tests regularly. - Encouraged continued weight management.  General health maintenance Due for tetanus and shingles vaccinations. - Administer tetanus and shingles vaccinations at pharmacy.   Return in about 3 months (around 09/10/2024) for chronic care management and physical.    Carrol Aurora, NP     [1]  Allergies Allergen Reactions   Demerol Other (See Comments)    vomiting   Peanut-Containing Drug Products     Mouth swelling   Atorvastatin  Calcium  Other (See Comments)   Empagliflozin     Other Reaction(s): recurrent yeast infection   "

## 2024-06-13 MED ORDER — TRULICITY 3 MG/0.5ML ~~LOC~~ SOAJ: 3.0000 mg | SUBCUTANEOUS | 0 refills | Status: AC

## 2024-07-10 NOTE — Telephone Encounter (Signed)
 Gave [employer] ([employer] a call to follow up on pt application per representative pt is approved thru 06/14/2025, they have mail out  medication on 06/28/2024.

## 2024-09-08 ENCOUNTER — Encounter: Admitting: General Practice

## 2024-12-12 ENCOUNTER — Ambulatory Visit

## 2024-12-21 ENCOUNTER — Ambulatory Visit
